# Patient Record
Sex: Female | Born: 1955 | Race: White | Hispanic: No | Marital: Married | Smoking: Former smoker
Health system: Southern US, Community
[De-identification: ages and names within clinical notes are randomized; demographics above are authoritative.]

## PROBLEM LIST (undated history)

## (undated) DIAGNOSIS — I10 Essential (primary) hypertension: Secondary | ICD-10-CM

## (undated) DIAGNOSIS — I8393 Asymptomatic varicose veins of bilateral lower extremities: Secondary | ICD-10-CM

## (undated) DIAGNOSIS — R911 Solitary pulmonary nodule: Secondary | ICD-10-CM

## (undated) DIAGNOSIS — S92009A Unspecified fracture of unspecified calcaneus, initial encounter for closed fracture: Secondary | ICD-10-CM

## (undated) DIAGNOSIS — D649 Anemia, unspecified: Secondary | ICD-10-CM

## (undated) DIAGNOSIS — N6019 Diffuse cystic mastopathy of unspecified breast: Secondary | ICD-10-CM

## (undated) DIAGNOSIS — Z860101 Personal history of adenomatous and serrated colon polyps: Secondary | ICD-10-CM

## (undated) DIAGNOSIS — Z9989 Dependence on other enabling machines and devices: Secondary | ICD-10-CM

## (undated) DIAGNOSIS — G4730 Sleep apnea, unspecified: Secondary | ICD-10-CM

## (undated) DIAGNOSIS — M8580 Other specified disorders of bone density and structure, unspecified site: Secondary | ICD-10-CM

## (undated) DIAGNOSIS — Z86010 Personal history of colonic polyps: Secondary | ICD-10-CM

## (undated) DIAGNOSIS — T7840XA Allergy, unspecified, initial encounter: Secondary | ICD-10-CM

## (undated) DIAGNOSIS — G4733 Obstructive sleep apnea (adult) (pediatric): Secondary | ICD-10-CM

## (undated) DIAGNOSIS — E669 Obesity, unspecified: Secondary | ICD-10-CM

## (undated) DIAGNOSIS — N281 Cyst of kidney, acquired: Secondary | ICD-10-CM

## (undated) DIAGNOSIS — F419 Anxiety disorder, unspecified: Secondary | ICD-10-CM

## (undated) DIAGNOSIS — C679 Malignant neoplasm of bladder, unspecified: Secondary | ICD-10-CM

## (undated) HISTORY — DX: Other specified disorders of bone density and structure, unspecified site: M85.80

## (undated) HISTORY — DX: Unspecified fracture of unspecified calcaneus, initial encounter for closed fracture: S92.009A

## (undated) HISTORY — DX: Sleep apnea, unspecified: G47.30

## (undated) HISTORY — DX: Diffuse cystic mastopathy of unspecified breast: N60.19

## (undated) HISTORY — DX: Obstructive sleep apnea (adult) (pediatric): G47.33

## (undated) HISTORY — DX: Essential (primary) hypertension: I10

## (undated) HISTORY — PX: NO PAST SURGERIES: SHX2092

## (undated) HISTORY — DX: Allergy, unspecified, initial encounter: T78.40XA

## (undated) HISTORY — DX: Obesity, unspecified: E66.9

## (undated) HISTORY — DX: Personal history of colonic polyps: Z86.010

## (undated) HISTORY — PX: OTHER SURGICAL HISTORY: SHX169

## (undated) HISTORY — PX: COLONOSCOPY W/ POLYPECTOMY: SHX1380

## (undated) HISTORY — PX: BREAST CYST ASPIRATION: SHX578

---

## 2001-05-23 ENCOUNTER — Encounter: Payer: Self-pay | Admitting: Obstetrics and Gynecology

## 2001-05-23 ENCOUNTER — Encounter: Admission: RE | Admit: 2001-05-23 | Discharge: 2001-05-23 | Payer: Self-pay | Admitting: Obstetrics and Gynecology

## 2001-05-27 ENCOUNTER — Encounter: Payer: Self-pay | Admitting: Obstetrics and Gynecology

## 2001-05-27 ENCOUNTER — Encounter: Admission: RE | Admit: 2001-05-27 | Discharge: 2001-05-27 | Payer: Self-pay | Admitting: Obstetrics and Gynecology

## 2001-09-11 ENCOUNTER — Other Ambulatory Visit: Admission: RE | Admit: 2001-09-11 | Discharge: 2001-09-11 | Payer: Self-pay | Admitting: Obstetrics and Gynecology

## 2002-07-07 ENCOUNTER — Encounter: Admission: RE | Admit: 2002-07-07 | Discharge: 2002-07-07 | Payer: Self-pay | Admitting: Obstetrics and Gynecology

## 2002-07-07 ENCOUNTER — Encounter: Payer: Self-pay | Admitting: Obstetrics and Gynecology

## 2003-09-09 ENCOUNTER — Encounter: Admission: RE | Admit: 2003-09-09 | Discharge: 2003-09-09 | Payer: Self-pay | Admitting: Obstetrics and Gynecology

## 2004-09-29 ENCOUNTER — Encounter: Admission: RE | Admit: 2004-09-29 | Discharge: 2004-09-29 | Payer: Self-pay | Admitting: Surgery

## 2004-09-29 ENCOUNTER — Ambulatory Visit: Payer: Self-pay | Admitting: Internal Medicine

## 2004-11-07 ENCOUNTER — Ambulatory Visit: Payer: Self-pay | Admitting: Internal Medicine

## 2005-05-09 ENCOUNTER — Ambulatory Visit: Payer: Self-pay | Admitting: Internal Medicine

## 2005-11-26 ENCOUNTER — Encounter: Admission: RE | Admit: 2005-11-26 | Discharge: 2005-11-26 | Payer: Self-pay | Admitting: Surgery

## 2006-06-25 ENCOUNTER — Ambulatory Visit: Payer: Self-pay | Admitting: Internal Medicine

## 2006-07-05 ENCOUNTER — Ambulatory Visit: Payer: Self-pay | Admitting: Internal Medicine

## 2006-07-22 ENCOUNTER — Ambulatory Visit: Payer: Self-pay | Admitting: Gastroenterology

## 2006-07-31 ENCOUNTER — Ambulatory Visit: Payer: Self-pay | Admitting: Gastroenterology

## 2006-07-31 ENCOUNTER — Encounter (INDEPENDENT_AMBULATORY_CARE_PROVIDER_SITE_OTHER): Payer: Self-pay | Admitting: *Deleted

## 2006-12-06 ENCOUNTER — Encounter: Admission: RE | Admit: 2006-12-06 | Discharge: 2006-12-06 | Payer: Self-pay | Admitting: Surgery

## 2007-02-24 ENCOUNTER — Encounter: Payer: Self-pay | Admitting: Internal Medicine

## 2007-02-24 ENCOUNTER — Ambulatory Visit: Payer: Self-pay | Admitting: Internal Medicine

## 2007-08-06 ENCOUNTER — Telehealth (INDEPENDENT_AMBULATORY_CARE_PROVIDER_SITE_OTHER): Payer: Self-pay | Admitting: *Deleted

## 2007-11-14 ENCOUNTER — Ambulatory Visit: Payer: Self-pay | Admitting: Internal Medicine

## 2007-11-14 DIAGNOSIS — I10 Essential (primary) hypertension: Secondary | ICD-10-CM | POA: Insufficient documentation

## 2007-11-14 DIAGNOSIS — D649 Anemia, unspecified: Secondary | ICD-10-CM | POA: Insufficient documentation

## 2007-11-24 ENCOUNTER — Ambulatory Visit: Payer: Self-pay | Admitting: Internal Medicine

## 2007-11-25 ENCOUNTER — Encounter (INDEPENDENT_AMBULATORY_CARE_PROVIDER_SITE_OTHER): Payer: Self-pay | Admitting: *Deleted

## 2007-11-25 ENCOUNTER — Encounter: Payer: Self-pay | Admitting: Internal Medicine

## 2007-11-25 LAB — CONVERTED CEMR LAB
Alkaline Phosphatase: 63 units/L (ref 39–117)
BUN: 21 mg/dL (ref 6–23)
CO2: 30 meq/L (ref 19–32)
Calcium: 9.7 mg/dL (ref 8.4–10.5)
Chloride: 103 meq/L (ref 96–112)
Creatinine, Ser: 1 mg/dL (ref 0.4–1.2)
Eosinophils Absolute: 0.2 10*3/uL (ref 0.0–0.6)
Glucose, Bld: 108 mg/dL — ABNORMAL HIGH (ref 70–99)
HCT: 33.8 % — ABNORMAL LOW (ref 36.0–46.0)
Lymphocytes Relative: 28.4 % (ref 12.0–46.0)
MCHC: 34.1 g/dL (ref 30.0–36.0)
Monocytes Relative: 7.6 % (ref 3.0–11.0)
Neutro Abs: 3.3 10*3/uL (ref 1.4–7.7)
Neutrophils Relative %: 59.3 % (ref 43.0–77.0)
Potassium: 4.5 meq/L (ref 3.5–5.1)
RBC: 3.66 M/uL — ABNORMAL LOW (ref 3.87–5.11)
RDW: 11.7 % (ref 11.5–14.6)
Total Bilirubin: 0.5 mg/dL (ref 0.3–1.2)
Vitamin B-12: 301 pg/mL (ref 211–911)
WBC: 5.6 10*3/uL (ref 4.5–10.5)

## 2007-11-29 LAB — CONVERTED CEMR LAB: Vit D, 1,25-Dihydroxy: 73 (ref 30–89)

## 2007-12-01 ENCOUNTER — Encounter (INDEPENDENT_AMBULATORY_CARE_PROVIDER_SITE_OTHER): Payer: Self-pay | Admitting: *Deleted

## 2007-12-23 ENCOUNTER — Encounter: Admission: RE | Admit: 2007-12-23 | Discharge: 2007-12-23 | Payer: Self-pay | Admitting: Obstetrics and Gynecology

## 2008-01-02 ENCOUNTER — Ambulatory Visit: Payer: Self-pay | Admitting: Family Medicine

## 2008-01-05 ENCOUNTER — Encounter (INDEPENDENT_AMBULATORY_CARE_PROVIDER_SITE_OTHER): Payer: Self-pay | Admitting: *Deleted

## 2008-01-27 ENCOUNTER — Ambulatory Visit: Payer: Self-pay | Admitting: Internal Medicine

## 2008-02-08 LAB — CONVERTED CEMR LAB
Basophils Relative: 0.7 % (ref 0.0–1.0)
Eosinophils Absolute: 0.2 10*3/uL (ref 0.0–0.7)
Neutro Abs: 3.1 10*3/uL (ref 1.4–7.7)
Neutrophils Relative %: 57.8 % (ref 43.0–77.0)
Platelets: 290 10*3/uL (ref 150–400)
RDW: 11.4 % — ABNORMAL LOW (ref 11.5–14.6)
WBC: 5.3 10*3/uL (ref 4.5–10.5)

## 2008-02-09 ENCOUNTER — Encounter (INDEPENDENT_AMBULATORY_CARE_PROVIDER_SITE_OTHER): Payer: Self-pay | Admitting: *Deleted

## 2008-08-02 ENCOUNTER — Ambulatory Visit: Payer: Self-pay | Admitting: Internal Medicine

## 2008-08-10 ENCOUNTER — Encounter (INDEPENDENT_AMBULATORY_CARE_PROVIDER_SITE_OTHER): Payer: Self-pay | Admitting: *Deleted

## 2008-08-10 LAB — CONVERTED CEMR LAB
Basophils Relative: 0.5 % (ref 0.0–3.0)
Eosinophils Relative: 2.8 % (ref 0.0–5.0)
Folate: 12.6 ng/mL
HCT: 34.4 % — ABNORMAL LOW (ref 36.0–46.0)
MCHC: 34.2 g/dL (ref 30.0–36.0)
Monocytes Absolute: 0.4 10*3/uL (ref 0.1–1.0)
Monocytes Relative: 7.4 % (ref 3.0–12.0)
Neutro Abs: 3.4 10*3/uL (ref 1.4–7.7)
Platelets: 257 10*3/uL (ref 150–400)
RBC: 3.65 M/uL — ABNORMAL LOW (ref 3.87–5.11)
RDW: 11.5 % (ref 11.5–14.6)
Saturation Ratios: 19.2 % — ABNORMAL LOW (ref 20.0–50.0)
WBC: 5.8 10*3/uL (ref 4.5–10.5)

## 2008-08-19 ENCOUNTER — Ambulatory Visit: Payer: Self-pay | Admitting: Internal Medicine

## 2008-10-29 DIAGNOSIS — S92009A Unspecified fracture of unspecified calcaneus, initial encounter for closed fracture: Secondary | ICD-10-CM

## 2008-10-29 HISTORY — DX: Unspecified fracture of unspecified calcaneus, initial encounter for closed fracture: S92.009A

## 2009-03-29 ENCOUNTER — Ambulatory Visit: Payer: Self-pay | Admitting: Internal Medicine

## 2009-03-29 ENCOUNTER — Encounter (INDEPENDENT_AMBULATORY_CARE_PROVIDER_SITE_OTHER): Payer: Self-pay | Admitting: *Deleted

## 2009-03-29 DIAGNOSIS — M8580 Other specified disorders of bone density and structure, unspecified site: Secondary | ICD-10-CM | POA: Insufficient documentation

## 2009-03-30 ENCOUNTER — Ambulatory Visit: Payer: Self-pay | Admitting: Internal Medicine

## 2009-03-31 ENCOUNTER — Telehealth (INDEPENDENT_AMBULATORY_CARE_PROVIDER_SITE_OTHER): Payer: Self-pay | Admitting: *Deleted

## 2009-03-31 ENCOUNTER — Encounter (INDEPENDENT_AMBULATORY_CARE_PROVIDER_SITE_OTHER): Payer: Self-pay | Admitting: *Deleted

## 2009-04-04 ENCOUNTER — Telehealth: Payer: Self-pay | Admitting: Internal Medicine

## 2009-04-07 ENCOUNTER — Encounter: Payer: Self-pay | Admitting: Internal Medicine

## 2009-04-07 ENCOUNTER — Ambulatory Visit: Payer: Self-pay | Admitting: Family Medicine

## 2009-04-15 ENCOUNTER — Telehealth: Payer: Self-pay | Admitting: Internal Medicine

## 2009-04-18 ENCOUNTER — Encounter: Admission: RE | Admit: 2009-04-18 | Discharge: 2009-04-18 | Payer: Self-pay | Admitting: Sports Medicine

## 2009-05-05 ENCOUNTER — Encounter (INDEPENDENT_AMBULATORY_CARE_PROVIDER_SITE_OTHER): Payer: Self-pay | Admitting: *Deleted

## 2009-08-17 ENCOUNTER — Encounter: Admission: RE | Admit: 2009-08-17 | Discharge: 2009-08-17 | Payer: Self-pay | Admitting: Obstetrics and Gynecology

## 2009-09-02 ENCOUNTER — Ambulatory Visit: Payer: Self-pay | Admitting: Internal Medicine

## 2009-12-13 ENCOUNTER — Ambulatory Visit: Payer: Self-pay | Admitting: Internal Medicine

## 2009-12-13 DIAGNOSIS — R7309 Other abnormal glucose: Secondary | ICD-10-CM | POA: Insufficient documentation

## 2009-12-13 DIAGNOSIS — Z860100 Personal history of colon polyps, unspecified: Secondary | ICD-10-CM | POA: Insufficient documentation

## 2009-12-21 ENCOUNTER — Ambulatory Visit: Payer: Self-pay | Admitting: Internal Medicine

## 2010-01-02 LAB — CONVERTED CEMR LAB
Alkaline Phosphatase: 70 units/L (ref 39–117)
Basophils Relative: 1.6 % (ref 0.0–3.0)
Bilirubin, Direct: 0 mg/dL (ref 0.0–0.3)
Calcium: 9.7 mg/dL (ref 8.4–10.5)
Chloride: 108 meq/L (ref 96–112)
Eosinophils Absolute: 0.2 10*3/uL (ref 0.0–0.7)
Eosinophils Relative: 3.4 % (ref 0.0–5.0)
HCT: 32.6 % — ABNORMAL LOW (ref 36.0–46.0)
Lymphocytes Relative: 32.5 % (ref 12.0–46.0)
MCHC: 34.3 g/dL (ref 30.0–36.0)
MCV: 92.7 fL (ref 78.0–100.0)
Monocytes Absolute: 0.4 10*3/uL (ref 0.1–1.0)
Monocytes Relative: 7.9 % (ref 3.0–12.0)
Neutro Abs: 2.3 10*3/uL (ref 1.4–7.7)
Neutrophils Relative %: 54.6 % (ref 43.0–77.0)
Platelets: 236 10*3/uL (ref 150.0–400.0)
Potassium: 4.7 meq/L (ref 3.5–5.1)
Saturation Ratios: 17.1 % — ABNORMAL LOW (ref 20.0–50.0)
TSH: 1.13 microintl units/mL (ref 0.35–5.50)
Total Bilirubin: 0.4 mg/dL (ref 0.3–1.2)
Transferrin: 234 mg/dL (ref 212.0–360.0)
VLDL: 16 mg/dL (ref 0.0–40.0)
WBC: 4.5 10*3/uL (ref 4.5–10.5)

## 2010-09-14 ENCOUNTER — Ambulatory Visit: Payer: Self-pay | Admitting: Internal Medicine

## 2010-11-28 NOTE — Assessment & Plan Note (Signed)
Summary: SINUS FOR 3 WEEKS, COUGH///SPH   Vital Signs:  Patient profile:   55 year old female Weight:      174.2 pounds BMI:     30.72 Temp:     99.2 degrees F oral Pulse rate:   72 / minute Resp:     15 per minute BP sitting:   116 / 70  (left arm) Cuff size:   large  Vitals Entered By: Shonna Chock CMA (September 14, 2010 4:11 PM) CC: Sinus concerns and cough x 3 weeks, URI symptoms   CC:  Sinus concerns and cough x 3 weeks and URI symptoms.  History of Present Illness: RTI Symptoms      This is a 55 year old woman who presents with RTI  symptoms; onset 3 weeks ago sinus pressure . Mucinex helped until 10 days ago when she developed throat congestion.  Now the patient reports nasal congestion and purulent nasal discharge, but denies sore throat and earache.  The patient denies fever, dyspnea, and wheezing.  The patient denies headache.  Risk factors for Strep sinusitis include bilateral facial pain and tooth pain.  The patient denies the following risk factors for Strep sinusitis: tender adenopathy.  Rx: Neti pot   Current Medications (verified): 1)  Spironolactone 25 Mg  Tabs (Spironolactone) .Marland Kitchen.. 1 By Mouth Once Daily- 2)  Diovan 320 Mg  Tabs (Valsartan) .... 1/2 Tab Qd 3)  Calcium .... 2 Tabs Qd  Allergies: 1)  ! Lotensin 2)  ! Hydrochlorothiazide  Physical Exam  General:  well-nourished,in no acute distress; alert,appropriate and cooperative throughout examination Ears:  External ear exam shows no significant lesions or deformities.  Otoscopic examination reveals clear canals, tympanic membranes are intact bilaterally without bulging, retraction, inflammation or discharge. Hearing is grossly normal bilaterally. Nose:  External nasal examination shows no deformity or inflammation. Nasal mucosa are pink and moist without lesions or exudates. Mouth:  Oral mucosa and oropharynx without lesions or exudates.  Teeth in good repair. Lungs:  Normal respiratory effort, chest expands  symmetrically. Lungs are clear to auscultation, no crackles or wheezes. Cervical Nodes:  No lymphadenopathy noted Axillary Nodes:  No palpable lymphadenopathy   Impression & Recommendations:  Problem # 1:  SINUSITIS- ACUTE-NOS (ICD-461.9)  Her updated medication list for this problem includes:    Amoxicillin 500 Mg Caps (Amoxicillin) .Marland Kitchen... 1 three times a day  Complete Medication List: 1)  Spironolactone 25 Mg Tabs (Spironolactone) .Marland Kitchen.. 1 by mouth once daily- 2)  Diovan 320 Mg Tabs (Valsartan) .... 1/2 tab qd 3)  Calcium  .... 2 tabs qd 4)  Amoxicillin 500 Mg Caps (Amoxicillin) .Marland Kitchen.. 1 three times a day  Patient Instructions: 1)  Neti pot  once daily until  sinuses. 2)  Drink as much NON dairy  fluid as you can tolerate for the next few days. Prescriptions: AMOXICILLIN 500 MG CAPS (AMOXICILLIN) 1 three times a day  #30 x 0   Entered and Authorized by:   Marga Melnick MD   Signed by:   Marga Melnick MD on 09/14/2010   Method used:   [facility]axed to ...       [facility] [phone]* (retail)       [address]       [place], [place]  [address]       Ph: [phone]       Fax: [phone]   RxID:   [id number]    Orders Added:  1)  Est. Patient Level III [29562]

## 2010-11-28 NOTE — Assessment & Plan Note (Signed)
Summary: CPX/KDC   Vital Signs:  Patient profile:   55 year old female Height:      63.26 inches Weight:      173.2 pounds BMI:     30.54 Temp:     99.0 degrees F oral Pulse rate:   60 / minute Resp:     14 per minute BP sitting:   114 / 70  (left arm) Cuff size:   large  Vitals Entered By: Shonna Chock (December 13, 2009 1:01 PM)  Comments REVIEWED MED LIST, PATIENT AGREED DOSE AND INSTRUCTION CORRECT    History of Present Illness: Angel Williamson is here for a physical; she is asymptomatic.  Preventive Screening-Counseling & Management  Caffeine-Diet-Exercise     Does Patient Exercise: yes  Allergies: 1)  ! Lotensin 2)  ! Hydrochlorothiazide  Past History:  Past Medical History: Osteopenia: T score - 1.6 @ L femoral neck Hypertension low HDL fibrocystic breast disease Anemia, PMH of  Colonic polyps, hx of Fractured heel post fall; Hyperglycemia, PMH of  Past Surgical History: gravida  2 para 2 colonoscopy : adenomatous polyps 2003 & 2007, due 2012, Dr Arlyce Dice  Family History: paternal grandmother ? cancer maternal grandfather diabetes paternal grandfather cva @  age 74 father colon polyps mother anemia cousin anemia  Social History: Former Smoker quit 1988 Alcohol use-no No diet but decreased sugar Married Regular exercise-yes: Ellipitical or walking 3X/week  Review of Systems  The patient denies anorexia, fever, weight loss, weight gain, vision loss, decreased hearing, hoarseness, chest pain, syncope, dyspnea on exertion, peripheral edema, prolonged cough, headaches, hemoptysis, abdominal pain, melena, hematochezia, severe indigestion/heartburn, hematuria, incontinence, suspicious skin lesions, depression, unusual weight change, abnormal bleeding, enlarged lymph nodes, and angioedema.         Microscopic hematuria as per Dr Ambrose Mantle. GI:  Stool card done @ Gyn annually.. MS:  Complains of joint pain; denies joint redness and joint swelling; Hip pain with  prolonged sitting. Osteopenis 2010; vitamin D level was 73 in 2009. On Ca++ with vit D bid.  Physical Exam  General:  well-nourished, alert,appropriate and cooperative throughout examination Head:  Normocephalic and atraumatic without obvious abnormalities.  Eyes:  No corneal or conjunctival inflammation noted. Perrla. Funduscopic exam benign, without hemorrhages, exudates or papilledema.  Ears:  External ear exam shows no significant lesions or deformities.  Otoscopic examination reveals clear canals, tympanic membranes are intact bilaterally without bulging, retraction, inflammation or discharge. Hearing is grossly normal bilaterally. Nose:  External nasal examination shows no deformity or inflammation. Nasal mucosa are pink and moist without lesions or exudates. Mouth:  Oral mucosa and oropharynx without lesions or exudates.  Teeth in good repair. Neck:  No deformities, masses, or tenderness noted. Breasts:  Dr Ambrose Mantle Lungs:  Normal respiratory effort, chest expands symmetrically. Lungs are clear to auscultation, no crackles or wheezes. Heart:  Normal rate and regular rhythm. S1 and S2 normal without gallop, murmur, click, rub. S4  Abdomen:  Bowel sounds positive,abdomen soft and non-tender without masses, organomegaly or hernias noted. Genitalia:  Dr Ambrose Mantle Msk:  No deformity or scoliosis noted of thoracic or lumbar spine.   Pulses:  R and L carotid,radial,dorsalis pedis and posterior tibial pulses are full and equal bilaterally Extremities:  No clubbing, cyanosis, edema, or deformity noted with normal full range of motion of all joints.   Mi nor crepitus of knees Neurologic:  alert & oriented X3 and DTRs symmetrical and normal.   Skin:  Intact without suspicious lesions or rashes Cervical Nodes:  No lymphadenopathy noted Axillary Nodes:  No palpable lymphadenopathy Psych:  memory intact for recent and remote, normally interactive, and good eye contact.     Impression &  Recommendations:  Problem # 1:  PREVENTIVE HEALTH CARE (ICD-V70.0)  Orders: EKG w/ Interpretation (93000)  Problem # 2:  COLONIC POLYPS, HX OF (ICD-V12.72) as per Dr Arlyce Dice  Problem # 3:  OSTEOPENIA (ICD-733.90) T score -1.6 L femoral neck; BMD due 04/2011  Problem # 4:  UNSPECIFIED ANEMIA (ICD-285.9) PMH of  Problem # 5:  HYPERTENSION, ESSENTIAL NOS (ICD-401.9)  controlled Her updated medication list for this problem includes:    Spironolactone 25 Mg Tabs (Spironolactone) .Marland Kitchen... 1 by mouth once daily-    Diovan 320 Mg Tabs (Valsartan) .Marland Kitchen... 1/2 tab qd  Orders: EKG w/ Interpretation (93000)  Problem # 6:  HYPERGLYCEMIA, FASTING (ICD-790.29) FBS 108 in 2009  Complete Medication List: 1)  Spironolactone 25 Mg Tabs (Spironolactone) .Marland Kitchen.. 1 by mouth once daily- 2)  Diovan 320 Mg Tabs (Valsartan) .... 1/2 tab qd 3)  Calcium  .... 2 tabs qd  Patient Instructions: 1)  Schedule fasting labs:Vitamin D level, iron panel, B12/folate,  2)  BMP , 3)  Hepatic Panel , 4)  Lipid Panel , 5)  TSH , 6)  CBC w/ Diff , 7)  HbgA1C .  Prescriptions: DIOVAN 320 MG  TABS (VALSARTAN) 1/2 tab qd  #45 x 3   Entered and Authorized by:   Marga Melnick MD   Signed by:   Marga Melnick MD on 12/13/2009   Method used:   Faxed to ...       [facility] [phone]* (retail)       [address]       [place], [place]  [address]       Ph: [phone]       Fax: [phone]   RxID:   [id number] SPIRONOLACTONE 25 MG  TABS (SPIRONOLACTONE) 1 by mouth once daily-  #90 x 3   Entered and Authorized by:   Marga Melnick MD   Signed by:   Marga Melnick MD on 12/13/2009   Method used:   Faxed to ...       [facility] [phone]* (retail)       [address]       [place], [place]  [address]       Ph: [phone]       Fax: [phone]   RxID:   [id number]

## 2010-12-05 ENCOUNTER — Encounter: Payer: Self-pay | Admitting: Internal Medicine

## 2010-12-05 ENCOUNTER — Ambulatory Visit (INDEPENDENT_AMBULATORY_CARE_PROVIDER_SITE_OTHER): Payer: 59 | Admitting: Internal Medicine

## 2010-12-05 DIAGNOSIS — J019 Acute sinusitis, unspecified: Secondary | ICD-10-CM

## 2010-12-14 NOTE — Assessment & Plan Note (Signed)
Summary: SINUS CONGESTION/RH....   Vital Signs:  Patient profile:   55 year old female Weight:      174.8 pounds BMI:     30.82 Temp:     99.2 degrees F oral Pulse rate:   72 / minute Resp:     14 per minute BP sitting:   112 / 68  (left arm) Cuff size:   large  Vitals Entered By: Shonna Chock CMA (December 05, 2010 11:56 AM) CC: Sinus congestion x 1 week, URI symptoms   CC:  Sinus congestion x 1 week and URI symptoms.  History of Present Illness:    Onset 11/29/2010 as head congestion , frontal headache & facial pain;she now   reports purulent nasal discharge and sore throat, but denies productive cough and earache.  The patient denies fever, dyspnea, and wheezing.  The patient also reports persistent frontal  headache & bilateral nasal discharge.  The patient denies the following risk factors for Strep sinusitis: tooth pain and tender adenopathy.  Rx: Tylenol , Mucinex, Neti pot   Current Medications (verified): 1)  Spironolactone 25 Mg  Tabs (Spironolactone) .Marland Kitchen.. 1 By Mouth Once Daily- 2)  Diovan 320 Mg  Tabs (Valsartan) .... 1/2 Tab Qd 3)  Calcium .... 2 Tabs Qd 4)  Fish Oil 1200 Mg Caps (Omega-3 Fatty Acids) .Marland Kitchen.. 1 By Mouth Once Daily  Allergies: 1)  ! Lotensin 2)  ! Hydrochlorothiazide  Physical Exam  General:  well-nourished,in no acute distress; alert,appropriate and cooperative throughout examination Ears:  External ear exam shows no significant lesions or deformities.  Otoscopic examination reveals clear canals, tympanic membranes are intact bilaterally without bulging, retraction, inflammation or discharge. Hearing is grossly normal bilaterally. Nose:  External nasal examination shows no deformity or inflammation. Nasal mucosa are  dry without lesions or exudates. Mouth:  Oral mucosa and oropharynx without lesions or exudates.  Teeth in good repair. Lungs:  Normal respiratory effort, chest expands symmetrically. Lungs are clear to auscultation, no crackles or  wheezes. Heart:  Normal rate and regular rhythm. S1 and S2 normal without gallop, murmur, click, rub . Cervical Nodes:  No lymphadenopathy noted Axillary Nodes:  No palpable lymphadenopathy   Impression & Recommendations:  Problem # 1:  ACUTE SINUSITIS, UNSPECIFIED (ICD-461.9)  The following medications were removed from the medication list:    Amoxicillin 500 Mg Caps (Amoxicillin) .Marland Kitchen... 1 three times a day Her updated medication list for this problem includes:    Cefuroxime Axetil 500 Mg Tabs (Cefuroxime axetil) .Marland Kitchen... 1 two times a day    Fluticasone Propionate 50 Mcg/act Susp (Fluticasone propionate) .Marland Kitchen... 1 spray two times a day as needed  Complete Medication List: 1)  Spironolactone 25 Mg Tabs (Spironolactone) .Marland Kitchen.. 1 by mouth once daily- 2)  Diovan 320 Mg Tabs (Valsartan) .... 1/2 tab qd 3)  Calcium  .... 2 tabs qd 4)  Fish Oil 1200 Mg Caps (Omega-3 fatty acids) .Marland Kitchen.. 1 by mouth once daily 5)  Cefuroxime Axetil 500 Mg Tabs (Cefuroxime axetil) .Marland Kitchen.. 1 two times a day 6)  Fluticasone Propionate 50 Mcg/act Susp (Fluticasone propionate) .Marland Kitchen.. 1 spray two times a day as needed  Patient Instructions: 1)  Neti pot once daily - two times a day as needed for congestion. 2)  Drink as much NON dairy  fluid as you can tolerate for the next few days. Prescriptions: FLUTICASONE PROPIONATE 50 MCG/ACT SUSP (FLUTICASONE PROPIONATE) 1 spray two times a day as needed  #1 x 11   Entered and  Authorized by:   Marga Melnick MD   Signed by:   Marga Melnick MD on 12/05/2010   Method used:   Electronically to        [facility] [phone]* (retail)       [address]       [place], [place]  [address]       Ph: [phone]       Fax: [phone]   RxID:   [id number] CEFUROXIME AXETIL 500 MG TABS (CEFUROXIME AXETIL) 1 two times a day  #20 x 0   Entered and Authorized by:   Marga Melnick MD   Signed by:   Marga Melnick MD on 12/05/2010   Method used:    Electronically to        [facility] [phone]* (retail)       [address]       [place], [place]  [address]       Ph: [phone]       Fax: [phone]   RxID:   [id number]    Orders Added: 1)  Est. Patient Level III [30160]

## 2011-02-08 ENCOUNTER — Other Ambulatory Visit: Payer: Self-pay | Admitting: Internal Medicine

## 2011-05-18 ENCOUNTER — Other Ambulatory Visit: Payer: Self-pay | Admitting: Internal Medicine

## 2011-07-24 ENCOUNTER — Other Ambulatory Visit: Payer: Self-pay | Admitting: Internal Medicine

## 2011-08-01 ENCOUNTER — Other Ambulatory Visit: Payer: Self-pay | Admitting: Obstetrics and Gynecology

## 2011-08-01 DIAGNOSIS — Z1231 Encounter for screening mammogram for malignant neoplasm of breast: Secondary | ICD-10-CM

## 2011-08-09 ENCOUNTER — Ambulatory Visit
Admission: RE | Admit: 2011-08-09 | Discharge: 2011-08-09 | Disposition: A | Discharge status: Home / Self Care | Payer: 59 | Source: Ambulatory Visit | Attending: Obstetrics and Gynecology | Admitting: Obstetrics and Gynecology

## 2011-08-09 DIAGNOSIS — Z1231 Encounter for screening mammogram for malignant neoplasm of breast: Secondary | ICD-10-CM

## 2011-08-27 ENCOUNTER — Encounter: Payer: Self-pay | Admitting: Gastroenterology

## 2011-09-27 ENCOUNTER — Ambulatory Visit (INDEPENDENT_AMBULATORY_CARE_PROVIDER_SITE_OTHER): Payer: 59 | Admitting: Internal Medicine

## 2011-09-27 ENCOUNTER — Encounter: Payer: Self-pay | Admitting: Internal Medicine

## 2011-09-27 VITALS — BP 94/60 | HR 88 | Temp 100.1°F | Resp 18 | Wt 177.0 lb

## 2011-09-27 DIAGNOSIS — J329 Chronic sinusitis, unspecified: Secondary | ICD-10-CM

## 2011-09-27 MED ORDER — AMOXICILLIN-POT CLAVULANATE 875-125 MG PO TABS: 1.0000 | ORAL_TABLET | Freq: Two times a day (BID) | ORAL | Status: AC

## 2011-09-30 ENCOUNTER — Encounter: Payer: Self-pay | Admitting: Internal Medicine

## 2011-09-30 DIAGNOSIS — J329 Chronic sinusitis, unspecified: Secondary | ICD-10-CM | POA: Insufficient documentation

## 2011-09-30 NOTE — Assessment & Plan Note (Signed)
Take abx to completion. Followup if no improvement or worsening.

## 2011-09-30 NOTE — Progress Notes (Signed)
  Subjective:    Patient ID: KEMISHA BONNETTE, female    DOB: May 25, 1956, 55 y.o.   MRN: [id number]  HPI Pt presents to clinic for evaluation of cough. Notes 2wk h/o head congestion, ha, cough productive for green sputum. +bilateral maxillary pressure. No f/c. No other complaints.  No past medical history on file. No past surgical history on file.  reports that she has quit smoking. She has never used smokeless tobacco. She reports that she does not drink alcohol or use illicit drugs. family history is not on file. Allergies  Allergen Reactions  . Benazepril Hcl     REACTION: cough  . Hydrochlorothiazide     REACTION: low potassium      Review of Systems see hpi     Objective:   Physical Exam  Nursing note and vitals reviewed. Constitutional: She appears well-developed and well-nourished. No distress.  HENT:  Head: Normocephalic and atraumatic.  Right Ear: External ear normal.  Left Ear: External ear normal.  Nose: Right sinus exhibits maxillary sinus tenderness. Left sinus exhibits maxillary sinus tenderness.  Mouth/Throat: Oropharynx is clear and moist.  Eyes: Conjunctivae are normal. Right eye exhibits no discharge. Left eye exhibits no discharge. No scleral icterus.  Neck: Neck supple.  Neurological: She is alert.  Skin: Skin is warm and dry. She is not diaphoretic.  Psychiatric: She has a normal mood and affect.          Assessment & Plan:

## 2011-10-30 HISTORY — PX: COLONOSCOPY: SHX5424

## 2011-11-01 ENCOUNTER — Ambulatory Visit: Payer: 59 | Admitting: Family Medicine

## 2011-12-22 ENCOUNTER — Other Ambulatory Visit: Payer: Self-pay | Admitting: Internal Medicine

## 2012-01-22 ENCOUNTER — Other Ambulatory Visit: Payer: Self-pay | Admitting: Internal Medicine

## 2012-04-18 ENCOUNTER — Encounter: Payer: Self-pay | Admitting: Internal Medicine

## 2012-04-18 ENCOUNTER — Ambulatory Visit (INDEPENDENT_AMBULATORY_CARE_PROVIDER_SITE_OTHER): Payer: 59 | Admitting: Internal Medicine

## 2012-04-18 VITALS — BP 124/70 | HR 70 | Temp 98.8°F | Resp 12 | Ht 61.03 in | Wt 173.0 lb

## 2012-04-18 DIAGNOSIS — M949 Disorder of cartilage, unspecified: Secondary | ICD-10-CM

## 2012-04-18 DIAGNOSIS — Z0000 Encounter for general adult medical examination without abnormal findings: Secondary | ICD-10-CM

## 2012-04-18 DIAGNOSIS — D649 Anemia, unspecified: Secondary | ICD-10-CM | POA: Insufficient documentation

## 2012-04-18 DIAGNOSIS — M899 Disorder of bone, unspecified: Secondary | ICD-10-CM

## 2012-04-18 DIAGNOSIS — M8580 Other specified disorders of bone density and structure, unspecified site: Secondary | ICD-10-CM

## 2012-04-18 DIAGNOSIS — C4491 Basal cell carcinoma of skin, unspecified: Secondary | ICD-10-CM | POA: Insufficient documentation

## 2012-04-18 LAB — BASIC METABOLIC PANEL
BUN: 19 mg/dL (ref 6–23)
Creatinine, Ser: 0.9 mg/dL (ref 0.4–1.2)
Sodium: 138 mEq/L (ref 135–145)

## 2012-04-18 LAB — LIPID PANEL
Cholesterol: 134 mg/dL (ref 0–200)
LDL Cholesterol: 71 mg/dL (ref 0–99)
Triglycerides: 103 mg/dL (ref 0.0–149.0)

## 2012-04-18 LAB — CBC WITH DIFFERENTIAL/PLATELET
Basophils Absolute: 0 10*3/uL (ref 0.0–0.1)
Basophils Relative: 0.8 % (ref 0.0–3.0)
Eosinophils Absolute: 0.2 10*3/uL (ref 0.0–0.7)
Eosinophils Relative: 3 % (ref 0.0–5.0)
MCV: 92.5 fl (ref 78.0–100.0)
Monocytes Absolute: 0.4 10*3/uL (ref 0.1–1.0)
Monocytes Relative: 7.7 % (ref 3.0–12.0)
Platelets: 267 10*3/uL (ref 150.0–400.0)
RBC: 3.59 Mil/uL — ABNORMAL LOW (ref 3.87–5.11)
RDW: 12.5 % (ref 11.5–14.6)

## 2012-04-18 LAB — HEMOGLOBIN A1C: Hgb A1c MFr Bld: 6 % (ref 4.6–6.5)

## 2012-04-18 LAB — VITAMIN B12: Vitamin B-12: 279 pg/mL (ref 211–911)

## 2012-04-18 MED ORDER — VALSARTAN 320 MG PO TABS: 160.0000 mg | ORAL_TABLET | Freq: Every day | ORAL | Status: DC

## 2012-04-18 NOTE — Progress Notes (Signed)
  Subjective:    Patient ID: Angel Williamson, female    DOB: 11-22-1955, 56 y.o.   MRN: [id number]  HPI  Angel Williamson is here for a physical; she denies acute issues .      Review of Systems HYPERTENSION: Disease Monitoring: Blood pressure range-not monitored  Chest pain, palpitations-no      Dyspnea- no Medications: Compliance- yes  Lightheadedness,Syncope- no    Edema- no  FASTING HYPERGLYCEMIA, PMH of:  Disease Monitoring: Blood Sugar ranges-not monitored  Polyuria/phagia/dipsia- no       Visual problems-some blurring; Ophth exam due    ADENOMATOUS COLON POLYPS: Abd pain, bowel changes-no   She is to schedule followup colonoscopy with Dr. Arlyce Dice      Objective:   Physical Exam Gen.: Healthy and well-nourished in appearance. Alert, appropriate and cooperative throughout exam. Head: Normocephalic without obvious abnormalities  Eyes: No corneal or conjunctival inflammation noted. Pupils equal round reactive to light and accommodation. Fundal exam is benign without hemorrhages, exudate, papilledema. Extraocular motion intact. Vision grossly normal. Ears: External  ear exam reveals no significant lesions or deformities. Canals clear .TMs normal. Hearing is grossly normal bilaterally. Nose: External nasal exam reveals no deformity or inflammation. Nasal mucosa are pink and moist. No lesions or exudates noted.  Mouth: Oral mucosa and oropharynx reveal no lesions or exudates. Teeth in good repair. Neck: No deformities, masses, or tenderness noted. Range of motion &Thyroid normal. Lungs: Normal respiratory effort; chest expands symmetrically. Lungs are clear to auscultation without rales, wheezes, or increased work of breathing. Heart: Normal rate and rhythm. Normal S1 and S2. No gallop, click, or rub. S 4 w/o  murmur. Abdomen: Bowel sounds normal; abdomen soft and nontender. No masses, organomegaly or hernias noted. Genitalia: Dr Ambrose Mantle, Gyn                                                                          Musculoskeletal/extremities: MINOR lordosis noted of  the thoracic  spine. No clubbing, cyanosis, edema, or deformity noted. Range of motion  normal .Tone & strength  normal.Joints normal. Nail health  good. Vascular: Carotid, radial artery, dorsalis pedis and  posterior tibial pulses are full and equal. No bruits present. Neurologic: Alert and oriented x3. Deep tendon reflexes symmetrical and normal.          Skin: Intact without suspicious lesions or rashes. Lymph: No cervical, axillary lymphadenopathy present. Psych: Mood and affect are normal. Normally interactive                                                                                         Assessment & Plan:  #1 comprehensive physical exam; no acute findings #2 see Problem List with Assessments & Recommendations Plan: see Orders

## 2012-04-18 NOTE — Patient Instructions (Addendum)
Preventive Health Care: Exercise  30-45  minutes a day, 3-4 days a week. Walking is especially valuable in preventing Osteoporosis. Eat a low-fat diet with lots of fruits and vegetables, up to 7-9 servings per day.  Consume less than 30 grams of sugar per day from foods & drinks with High Fructose Corn Syrup as #1,2,3 or #4 on label. Eye Doctor - have an eye exam @ least annually  Blood Pressure Goal  Ideally is an AVERAGE < 135/85. This AVERAGE should be calculated from @ least 5-7 BP readings taken @ different times of day on different days of week. You should not respond to isolated BP readings , but rather the AVERAGE for that week.  Please try to go on My Chart within the next 24 hours to allow me to release the results directly to you.

## 2012-04-23 ENCOUNTER — Encounter: Payer: Self-pay | Admitting: *Deleted

## 2012-05-22 ENCOUNTER — Other Ambulatory Visit: Payer: Self-pay | Admitting: Internal Medicine

## 2012-05-22 NOTE — Telephone Encounter (Signed)
Refill done.  

## 2012-05-22 NOTE — Telephone Encounter (Signed)
Not on med list please advise if ok to rx

## 2012-05-22 NOTE — Telephone Encounter (Signed)
OK X 1 year 

## 2012-07-10 ENCOUNTER — Ambulatory Visit (INDEPENDENT_AMBULATORY_CARE_PROVIDER_SITE_OTHER)
Admission: RE | Admit: 2012-07-10 | Discharge: 2012-07-10 | Disposition: A | Discharge status: Home / Self Care | Payer: 59 | Source: Ambulatory Visit | Attending: Internal Medicine | Admitting: Internal Medicine

## 2012-07-10 DIAGNOSIS — M949 Disorder of cartilage, unspecified: Secondary | ICD-10-CM

## 2012-07-10 DIAGNOSIS — M8580 Other specified disorders of bone density and structure, unspecified site: Secondary | ICD-10-CM

## 2012-07-10 DIAGNOSIS — M899 Disorder of bone, unspecified: Secondary | ICD-10-CM

## 2012-07-24 ENCOUNTER — Other Ambulatory Visit: Payer: Self-pay | Admitting: Internal Medicine

## 2012-08-26 ENCOUNTER — Encounter: Payer: Self-pay | Admitting: Gastroenterology

## 2012-09-19 ENCOUNTER — Other Ambulatory Visit: Payer: Self-pay | Admitting: Internal Medicine

## 2012-09-19 NOTE — Telephone Encounter (Signed)
Rx sent.    MW 

## 2012-09-30 ENCOUNTER — Ambulatory Visit (AMBULATORY_SURGERY_CENTER): Payer: BC Managed Care – PPO | Admitting: *Deleted

## 2012-09-30 VITALS — Ht 63.0 in | Wt 179.2 lb

## 2012-09-30 DIAGNOSIS — Z1211 Encounter for screening for malignant neoplasm of colon: Secondary | ICD-10-CM

## 2012-09-30 MED ORDER — NA SULFATE-K SULFATE-MG SULF 17.5-3.13-1.6 GM/177ML PO SOLN: 1.0000 | Freq: Once | ORAL | Status: DC

## 2012-10-01 ENCOUNTER — Encounter: Payer: Self-pay | Admitting: Gastroenterology

## 2012-10-16 ENCOUNTER — Encounter: Payer: BC Managed Care – PPO | Admitting: Gastroenterology

## 2012-10-29 HISTORY — PX: COLONOSCOPY: SHX174

## 2012-11-18 ENCOUNTER — Ambulatory Visit (INDEPENDENT_AMBULATORY_CARE_PROVIDER_SITE_OTHER): Payer: 59 | Admitting: Internal Medicine

## 2012-11-18 VITALS — BP 118/78 | HR 85 | Temp 98.8°F | Wt 179.0 lb

## 2012-11-18 DIAGNOSIS — J40 Bronchitis, not specified as acute or chronic: Secondary | ICD-10-CM

## 2012-11-18 MED ORDER — AMOXICILLIN 500 MG PO CAPS: 1000.0000 mg | ORAL_CAPSULE | Freq: Two times a day (BID) | ORAL | Status: AC

## 2012-11-18 NOTE — Progress Notes (Signed)
  Subjective:    Patient ID: Angel Williamson, female    DOB: 1956-03-24, 57 y.o.   MRN: [id number]  HPI Acute visit Symptoms started 4 days ago: Sore throat, chest congestion, cough with pain at the tracheal area. Sinuses also mildly congested. She's taking Mucinex and Advil as needed.  Past Medical History  Diagnosis Date  . Osteopenia   . Hypertension   . Breast fibrocystic disorder   . History of colonic polyps     adenomatous  . Calcaneus fracture 2010    no surgery   Past Surgical History  Procedure Date  . Colonoscopy w/ polypectomy     adenomatous polyps 2003 & 2007 Due 2012. Dr.Kaplan   . G 2 p 2   . Breast cyst aspiration   . Colonoscopy    History   Social History  . Marital Status: Married    Spouse Name: N/A    Number of Children: N/A  . Years of Education: N/A   Occupational History  . Not on file.   Social History Main Topics  . Smoking status: Former Smoker    Quit date: 10/29/1984  . Smokeless tobacco: Never Used  . Alcohol Use: No  . Drug Use: No  . Sexually Active: Not on file   Other Topics Concern  . Not on file   Social History Narrative  . No narrative on file     Review of Systems No fever chills No nausea, vomiting, diarrhea. No myalgias. Patient is a nonsmoker, denies wheezing.     Objective:   Physical Exam  General -- alert, well-developed HEENT -- TMs normal, throat w/o redness, face symmetric and  slt tender to palpation in the maxillary and frontal sinuses. Nose congested  Lungs -- normal respiratory effort, no intercostal retractions, no accessory muscle use, and normal breath sounds.   Heart-- normal rate, regular rhythm, no murmur, and no gallop.   Psych-- Cognition and judgment appear intact. Alert and cooperative with normal attention span and concentration.  not anxious appearing and not depressed appearing.       Assessment & Plan:   Bronchitis, Symptoms consistent with tracheo- bronchitis, see instructions.

## 2012-11-18 NOTE — Patient Instructions (Addendum)
Rest, fluids , tylenol For cough, take Mucinex DM twice a day as needed  For congestion use flonase and a saline irrigation Take the antibiotic as prescribed  (Amoxicillin) if no better in 3-4 days. Call if no better next week Call anytime if the symptoms are severe

## 2012-11-20 ENCOUNTER — Encounter: Payer: Self-pay | Admitting: Internal Medicine

## 2013-01-21 ENCOUNTER — Other Ambulatory Visit: Payer: Self-pay | Admitting: Internal Medicine

## 2013-03-11 ENCOUNTER — Other Ambulatory Visit: Payer: Self-pay

## 2013-03-11 ENCOUNTER — Encounter: Payer: Self-pay | Admitting: Gastroenterology

## 2013-03-11 DIAGNOSIS — Z1231 Encounter for screening mammogram for malignant neoplasm of breast: Secondary | ICD-10-CM

## 2013-04-15 ENCOUNTER — Other Ambulatory Visit: Payer: Self-pay | Admitting: Internal Medicine

## 2013-04-15 NOTE — Telephone Encounter (Signed)
Patient needs to schedule a CPX  

## 2013-04-17 ENCOUNTER — Ambulatory Visit (AMBULATORY_SURGERY_CENTER): Payer: 59 | Admitting: *Deleted

## 2013-04-17 VITALS — Ht 62.0 in | Wt 175.4 lb

## 2013-04-17 DIAGNOSIS — Z1211 Encounter for screening for malignant neoplasm of colon: Secondary | ICD-10-CM

## 2013-04-17 MED ORDER — NA SULFATE-K SULFATE-MG SULF 17.5-3.13-1.6 GM/177ML PO SOLN: ORAL | Status: DC

## 2013-04-20 ENCOUNTER — Encounter: Payer: Self-pay | Admitting: Gastroenterology

## 2013-05-05 ENCOUNTER — Ambulatory Visit
Admission: RE | Admit: 2013-05-05 | Discharge: 2013-05-05 | Disposition: A | Discharge status: Home / Self Care | Payer: 59 | Source: Ambulatory Visit

## 2013-05-05 DIAGNOSIS — Z1231 Encounter for screening mammogram for malignant neoplasm of breast: Secondary | ICD-10-CM

## 2013-05-07 ENCOUNTER — Other Ambulatory Visit: Payer: Self-pay | Admitting: Obstetrics and Gynecology

## 2013-05-07 DIAGNOSIS — R928 Other abnormal and inconclusive findings on diagnostic imaging of breast: Secondary | ICD-10-CM

## 2013-05-08 ENCOUNTER — Ambulatory Visit (AMBULATORY_SURGERY_CENTER): Payer: 59 | Admitting: Gastroenterology

## 2013-05-08 ENCOUNTER — Encounter: Payer: Self-pay | Admitting: Gastroenterology

## 2013-05-08 VITALS — BP 109/72 | HR 50 | Temp 97.4°F | Resp 16 | Ht 62.0 in | Wt 175.0 lb

## 2013-05-08 DIAGNOSIS — Z86010 Personal history of colonic polyps: Secondary | ICD-10-CM

## 2013-05-08 MED ORDER — SODIUM CHLORIDE 0.9 % IV SOLN: 500.0000 mL | INTRAVENOUS | Status: DC

## 2013-05-08 NOTE — Progress Notes (Signed)
Procedure ends, to recovery awake, report given and VSS. 

## 2013-05-08 NOTE — Op Note (Signed)
[facility] [address] [place], [address]   COLONOSCOPY PROCEDURE REPORT  PATIENT: Angel Williamson, Angel Williamson  MR#: [id number] BIRTHDATE: 06-01-1956 , 57  yrs. old GENDER: Female ENDOSCOPIST: Louis Meckel, MD REFERRED BY: PROCEDURE DATE:  05/08/2013 PROCEDURE:   Colonoscopy, diagnostic ASA CLASS:   Class II INDICATIONS:Patient's personal history of adenomatous colon polyps. adenomatous polyps 2003 and 2007 MEDICATIONS: MAC sedation, administered by CRNA and Propofol (Diprivan) 230 mg IV  DESCRIPTION OF PROCEDURE:   After the risks benefits and alternatives of the procedure were thoroughly explained, informed consent was obtained.  A digital rectal exam revealed no abnormalities of the rectum.   The [id number]  endoscope was introduced through the anus and advanced to the cecum, which was identified by both the appendix and ileocecal valve. No adverse events experienced.   The quality of the prep was excellent using Suprep  The instrument was then slowly withdrawn as the colon was fully examined.      COLON FINDINGS: A normal appearing cecum, ileocecal valve, and appendiceal orifice were identified.  The ascending, hepatic flexure, transverse, splenic flexure, descending, sigmoid colon and rectum appeared unremarkable.  No polyps or cancers were seen. Retroflexed views revealed no abnormalities. The time to cecum=3 minutes 13 seconds.  Withdrawal time=6 minutes 12 seconds.  The scope was withdrawn and the procedure completed. COMPLICATIONS: There were no complications.  ENDOSCOPIC IMPRESSION: Normal colon  RECOMMENDATIONS: Repeat Colonscopy in 10 years.   eSigned:  Louis Meckel, MD 05/08/2013 9:25 AM   cc: Pecola Lawless, MD and Chauncey Reading, MD

## 2013-05-08 NOTE — Patient Instructions (Signed)
YOU HAD AN ENDOSCOPIC PROCEDURE TODAY AT THE [facility]: Refer to the procedure report that was given to you for any specific questions about what was found during the examination.  If the procedure report does not answer your questions, please call your gastroenterologist to clarify.  If you requested that your care partner not be given the details of your procedure findings, then the procedure report has been included in a sealed envelope for you to review at your convenience later.  YOU SHOULD EXPECT: Some feelings of bloating in the abdomen. Passage of more gas than usual.  Walking can help get rid of the air that was put into your GI tract during the procedure and reduce the bloating. If you had a lower endoscopy (such as a colonoscopy or flexible sigmoidoscopy) you may notice spotting of blood in your stool or on the toilet paper. If you underwent a bowel prep for your procedure, then you may not have a normal bowel movement for a few days.  DIET: Your first meal following the procedure should be a light meal and then it is ok to progress to your normal diet.  A half-sandwich or bowl of soup is an example of a good first meal.  Heavy or fried foods are harder to digest and may make you feel nauseous or bloated.  Likewise meals heavy in dairy and vegetables can cause extra gas to form and this can also increase the bloating.  Drink plenty of fluids but you should avoid alcoholic beverages for 24 hours.  ACTIVITY: Your care partner should take you home directly after the procedure.  You should plan to take it easy, moving slowly for the rest of the day.  You can resume normal activity the day after the procedure however you should NOT DRIVE or use heavy machinery for 24 hours (because of the sedation medicines used during the test).    SYMPTOMS TO REPORT IMMEDIATELY: A gastroenterologist can be reached at any hour.  During normal business hours, 8:30 AM to 5:00 PM Monday through Friday,  call [phone].  After hours and on weekends, please call the GI answering service at [phone] who will take a message and have the physician on call contact you.   Following lower endoscopy (colonoscopy or flexible sigmoidoscopy):  Excessive amounts of blood in the stool  Significant tenderness or worsening of abdominal pains  Swelling of the abdomen that is new, acute  Fever of 100F or higher    FOLLOW UP: If any biopsies were taken you will be contacted by phone or by letter within the next 1-3 weeks.  Call your gastroenterologist if you have not heard about the biopsies in 3 weeks.  Our staff will call the home number listed on your records the next business day following your procedure to check on you and address any questions or concerns that you may have at that time regarding the information given to you following your procedure. This is a courtesy call and so if there is no answer at the home number and we have not heard from you through the emergency physician on call, we will assume that you have returned to your regular daily activities without incident.  SIGNATURES/CONFIDENTIALITY: You and/or your care partner have signed paperwork which will be entered into your electronic medical record.  These signatures attest to the fact that that the information above on your After Visit Summary has been reviewed and is understood.  Full responsibility of the confidentiality   of this discharge information lies with you and/or your care-partner.     

## 2013-05-08 NOTE — Progress Notes (Signed)
Patient did not experience any of the following events: a burn prior to discharge; a fall within the facility; wrong site/side/patient/procedure/implant event; or a hospital transfer or hospital admission upon discharge from the facility. (G8907) Patient did not have preoperative order for IV antibiotic SSI prophylaxis. (G8918)  

## 2013-05-11 ENCOUNTER — Telehealth: Payer: Self-pay | Admitting: *Deleted

## 2013-05-11 NOTE — Telephone Encounter (Signed)
  Follow up Call-  Call back number 05/08/2013  Post procedure Call Back phone  # [phone]  Permission to leave phone message Yes    [facility]

## 2013-05-20 ENCOUNTER — Ambulatory Visit
Admission: RE | Admit: 2013-05-20 | Discharge: 2013-05-20 | Disposition: A | Discharge status: Home / Self Care | Payer: 59 | Source: Ambulatory Visit | Attending: Obstetrics and Gynecology | Admitting: Obstetrics and Gynecology

## 2013-05-20 DIAGNOSIS — R928 Other abnormal and inconclusive findings on diagnostic imaging of breast: Secondary | ICD-10-CM

## 2013-06-08 ENCOUNTER — Other Ambulatory Visit: Payer: Self-pay | Admitting: Internal Medicine

## 2013-07-20 ENCOUNTER — Other Ambulatory Visit: Payer: Self-pay | Admitting: Internal Medicine

## 2013-09-09 ENCOUNTER — Other Ambulatory Visit: Payer: Self-pay | Admitting: Internal Medicine

## 2013-09-09 NOTE — Telephone Encounter (Signed)
Diovan refilled. 

## 2013-10-19 ENCOUNTER — Other Ambulatory Visit: Payer: Self-pay | Admitting: Internal Medicine

## 2013-10-20 NOTE — Telephone Encounter (Signed)
Spironolactone refill refused, pt needs OV (last seen 03/2012). JG//CMA

## 2013-10-26 ENCOUNTER — Telehealth: Payer: Self-pay | Admitting: Internal Medicine

## 2013-10-26 ENCOUNTER — Other Ambulatory Visit: Payer: Self-pay | Admitting: *Deleted

## 2013-10-26 MED ORDER — SPIRONOLACTONE 25 MG PO TABS: ORAL_TABLET | ORAL | Status: DC

## 2013-10-26 NOTE — Telephone Encounter (Signed)
Patient called stating she needs refill on spironolactone. She has CPE for this Friday 1/2/5. Pt uses [facility]

## 2013-10-27 ENCOUNTER — Telehealth: Payer: Self-pay

## 2013-10-27 NOTE — Telephone Encounter (Signed)
Done. JG//CMA 

## 2013-10-27 NOTE — Telephone Encounter (Signed)
Medication and allergies:  Reviewed and updated  90 day supply/mail order: na Local pharmacy: [facility]   Immunizations due:  Flu and tdap  A/P:   No changes to FH or PSH Pap-Dr Ambrose Mantle  [facility]--04/2013--follow up 6 months Bone Density--06/2012 [facility]--04/2013--Dr Kaplan--next due 2024  To Discuss with Provider: Not at this time

## 2013-10-30 ENCOUNTER — Encounter: Payer: 59 | Admitting: Internal Medicine

## 2013-11-04 ENCOUNTER — Ambulatory Visit (INDEPENDENT_AMBULATORY_CARE_PROVIDER_SITE_OTHER): Payer: 59 | Admitting: Internal Medicine

## 2013-11-04 ENCOUNTER — Encounter: Payer: Self-pay | Admitting: Internal Medicine

## 2013-11-04 VITALS — BP 125/81 | HR 88 | Temp 98.4°F | Resp 18 | Wt 181.0 lb

## 2013-11-04 DIAGNOSIS — M5136 Other intervertebral disc degeneration, lumbar region: Secondary | ICD-10-CM

## 2013-11-04 DIAGNOSIS — M5137 Other intervertebral disc degeneration, lumbosacral region: Secondary | ICD-10-CM

## 2013-11-04 DIAGNOSIS — M899 Disorder of bone, unspecified: Secondary | ICD-10-CM

## 2013-11-04 DIAGNOSIS — I10 Essential (primary) hypertension: Secondary | ICD-10-CM

## 2013-11-04 DIAGNOSIS — Z86010 Personal history of colonic polyps: Secondary | ICD-10-CM

## 2013-11-04 DIAGNOSIS — M949 Disorder of cartilage, unspecified: Secondary | ICD-10-CM

## 2013-11-04 MED ORDER — VALSARTAN 320 MG PO TABS: ORAL_TABLET | ORAL | Status: DC

## 2013-11-04 MED ORDER — SPIRONOLACTONE 25 MG PO TABS: ORAL_TABLET | ORAL | Status: DC

## 2013-11-04 NOTE — Progress Notes (Signed)
Subjective:    Patient ID: Angel Williamson, female    DOB: 05-24-1956, 58 y.o.   MRN: [id number]  HPI Blood pressure rarely monitored. BP 110/82 on both ; 131/88 on ARB only.  Compliant with anti hypertemsive medication. Not on sodium restriction. No lightheadedness or other adverse medication effect described.   Hydrochlorothiazide has caused hypokalemia. She developed a cough with ACE inhibitors.  Her last renal function studies were 6/13: Creatinine 0.9 and potassium 3.9.  There is no personal or family history of kidney/bladder issues.       Review of Systems Significant headaches, epistaxis, chest pain, palpitations, exertional dyspnea, claudication, paroxysmal nocturnal dyspnea, or edema absent.  She's had right LS area pain for approximately 3 weeks. This is intermittent , dull up to level 4, & non radiating. There was no trigger such as repetitive motion or lifting for this pain. This discomfort is better if she puts pressure on this area. She denies any change in color or temperature in this area or associated rash. She has no associated abdominal symptoms of pain, unexplained weight loss, bowel changes, melena, rectal bleeding. She feels that the flank pain may be related to the angiotensin receptor blocker. When she does not take the spironolactone she has some discomfort in her thighs.  She not denies significant extremity weakness, numbness, or tingling. There is no radicular pain from the back into the legs.  She also denies incontinence of urine or stool.  She also denies dysuria, pyuria, or hematuria  At her most recent gynecologic visit dipstick suggested microscopic hematuria. Apparently when the urine was spun down no red cells were found.      Objective:   Physical Exam  Gen.: Healthy and well-nourished in appearance. Alert, appropriate and cooperative throughout exam.Appears younger than stated age  Head: Normocephalic without obvious abnormalities  Eyes:  No corneal or conjunctival inflammation noted. Pupils equal round reactive to light and accommodation.  Neck: No deformities, masses, or tenderness noted. Range of motion normal.  Lungs: Normal respiratory effort; chest expands symmetrically. Lungs are clear to auscultation without rales, wheezes, or increased work of breathing. Heart: Normal rate and rhythm. Normal S1 and S2. No gallop, click, or rub. S4 w/o murmur. Abdomen: Bowel sounds normal; abdomen soft and nontender. No masses, organomegaly or hernias noted.Striae peri umbilically                               Musculoskeletal/extremities: No deformity or scoliosis noted of  the thoracic or lumbar spine.   No clubbing, cyanosis, edema, or significant extremity  deformity noted. Range of motion normal .Tone & strength normal. Hand joints normal . Fingernail health good. Able to lie down & sit up w/o help. Negative SLR bilaterally Vascular: Carotid, radial artery, dorsalis pedis and  posterior tibial pulses are full and equal. No bruits present. Neurologic: Alert and oriented x3. Deep tendon reflexes symmetrical and normal.  Gait normal  including heel & toe walking .  Skin: Intact without suspicious lesions or rashes. Lymph: No cervical, axillary lymphadenopathy present. Psych: Mood and affect are normal. Normally interactive  Assessment & Plan:  #1 hypertension, control appears to be good. Renal function needs to be reassessed  #2 right lumbosacral musculoskeletal pain; this is not related to the medications. No neurologic deficit noted  #3 history of microscopic hematuria on dipstick apparently not documented on spun urine

## 2013-11-04 NOTE — Patient Instructions (Addendum)
Your next office appointment will be determined based upon review of your pending labs . Those instructions will be transmitted to you through My Chart  or by mail if you're not using this system.  Cardiovascular exercise, this can be as simple a program as walking, is recommended 30-45 minutes 3-4 times per week. If you're not exercising you should take 6-8 weeks to build up to this level.   Followup as needed for your this acute issue. Please report any significant change in your symptoms. Minimal Blood Pressure Goal= AVERAGE < 140/90;  Ideal is an AVERAGE < 135/85. This AVERAGE should be calculated from @ least 5-7 BP readings taken @ different times of day on different days of week. You should not respond to isolated BP readings , but rather the AVERAGE for that week .Please bring your  blood pressure cuff to office visits to verify that it is reliable.It  can also be checked against the blood pressure device at the pharmacy. Finger or wrist cuffs are not dependable; an arm cuff is. The best exercises for the low back include freestyle swimming, stretch aerobics, and yoga.  Eat a low-fat diet with lots of fruits and vegetables, up to 7-9 servings per day. Consume less than  30  Grams (preferably ZERO) of sugar per day from foods & drinks with High Fructose Corn Syrup (HFCS) sugar as #1,2,3 or # 4 on label.[employer], [employer] do not carry products with HFCS. Follow a  low carb nutrition program such as [place] or The New Sugar Busters  to prevent Diabetes risk.

## 2013-11-04 NOTE — Progress Notes (Signed)
Pre-visit discussion using our clinic review tool. No additional management support is needed unless otherwise documented below in the visit note.  

## 2013-11-04 NOTE — Addendum Note (Signed)
Addended by: Harl Bowie on: 11/04/2013 03:14 PM   Modules accepted: Orders

## 2013-11-05 ENCOUNTER — Telehealth: Payer: Self-pay | Admitting: Internal Medicine

## 2013-11-05 ENCOUNTER — Other Ambulatory Visit: Payer: 59

## 2013-11-05 NOTE — Telephone Encounter (Signed)
Relevant patient education mailed to patient.  

## 2013-11-06 ENCOUNTER — Other Ambulatory Visit (INDEPENDENT_AMBULATORY_CARE_PROVIDER_SITE_OTHER): Payer: 59

## 2013-11-06 DIAGNOSIS — M5137 Other intervertebral disc degeneration, lumbosacral region: Secondary | ICD-10-CM

## 2013-11-06 DIAGNOSIS — M949 Disorder of cartilage, unspecified: Secondary | ICD-10-CM

## 2013-11-06 DIAGNOSIS — M5136 Other intervertebral disc degeneration, lumbar region: Secondary | ICD-10-CM

## 2013-11-06 DIAGNOSIS — Z86010 Personal history of colonic polyps: Secondary | ICD-10-CM

## 2013-11-06 DIAGNOSIS — M899 Disorder of bone, unspecified: Secondary | ICD-10-CM

## 2013-11-06 DIAGNOSIS — I10 Essential (primary) hypertension: Secondary | ICD-10-CM

## 2013-11-06 LAB — BASIC METABOLIC PANEL
BUN: 17 mg/dL (ref 6–23)
CALCIUM: 9.3 mg/dL (ref 8.4–10.5)
CHLORIDE: 103 meq/L (ref 96–112)
CO2: 27 meq/L (ref 19–32)
Creatinine, Ser: 0.9 mg/dL (ref 0.4–1.2)
GFR: 66.69 mL/min (ref >60.00)
GLUCOSE: 114 mg/dL — AB (ref 70–99)
Potassium: 3.5 mEq/L (ref 3.5–5.1)
SODIUM: 137 meq/L (ref 135–145)

## 2013-11-06 LAB — TSH: TSH: 1.24 u[IU]/mL (ref 0.35–5.50)

## 2013-11-11 ENCOUNTER — Encounter: Payer: Self-pay | Admitting: *Deleted

## 2013-11-13 LAB — VITAMIN D 1,25 DIHYDROXY
VITAMIN D3 1, 25 (OH): 59 pg/mL
Vitamin D 1, 25 (OH)2 Total: 59 pg/mL (ref 18–72)
Vitamin D2 1, 25 (OH)2: <8 pg/mL

## 2013-11-20 ENCOUNTER — Encounter: Payer: 59 | Admitting: Internal Medicine

## 2013-12-29 ENCOUNTER — Other Ambulatory Visit: Payer: Self-pay | Admitting: Obstetrics and Gynecology

## 2013-12-29 DIAGNOSIS — N63 Unspecified lump in unspecified breast: Secondary | ICD-10-CM

## 2014-01-06 ENCOUNTER — Ambulatory Visit
Admission: RE | Admit: 2014-01-06 | Discharge: 2014-01-06 | Disposition: A | Discharge status: Home / Self Care | Payer: 59 | Source: Ambulatory Visit | Attending: Obstetrics and Gynecology | Admitting: Obstetrics and Gynecology

## 2014-01-06 DIAGNOSIS — N63 Unspecified lump in unspecified breast: Secondary | ICD-10-CM

## 2014-01-15 ENCOUNTER — Ambulatory Visit (INDEPENDENT_AMBULATORY_CARE_PROVIDER_SITE_OTHER): Payer: 59 | Admitting: Family Medicine

## 2014-01-15 ENCOUNTER — Encounter: Payer: Self-pay | Admitting: Family Medicine

## 2014-01-15 VITALS — BP 118/80 | HR 79 | Temp 98.2°F | Resp 16 | Wt 182.4 lb

## 2014-01-15 DIAGNOSIS — J019 Acute sinusitis, unspecified: Secondary | ICD-10-CM

## 2014-01-15 MED ORDER — AMOXICILLIN 875 MG PO TABS: 875.0000 mg | ORAL_TABLET | Freq: Two times a day (BID) | ORAL | Status: DC

## 2014-01-15 NOTE — Progress Notes (Signed)
Pre visit review using our clinic review tool, if applicable. No additional management support is needed unless otherwise documented below in the visit note. 

## 2014-01-15 NOTE — Progress Notes (Signed)
   Subjective:    Patient ID: Angel Williamson, female    DOB: 01/23/1956, 58 y.o.   MRN: [id number]  Sinusitis Associated symptoms include headaches.  Headache   Sore Throat  Associated symptoms include headaches.   URI- sxs started ~10 days ago w/ a HA.  + facial pain/pressure.  Started Claritin w/o relief.  + nasal congestion, PND- worse at nights.  No fevers.  Minimal cough.  No ear pain.  + tooth pain.  No known sick contacts.  No N/V/D.   Review of Systems  Neurological: Positive for headaches.   For ROS see HPI     Objective:   Physical Exam  Vitals reviewed. Constitutional: She appears well-developed and well-nourished. No distress.  HENT:  Head: Normocephalic and atraumatic.  Right Ear: Tympanic membrane normal.  Left Ear: Tympanic membrane normal.  Nose: Mucosal edema and rhinorrhea present. Right sinus exhibits maxillary sinus tenderness and frontal sinus tenderness. Left sinus exhibits maxillary sinus tenderness and frontal sinus tenderness.  Mouth/Throat: Uvula is midline and mucous membranes are normal. Posterior oropharyngeal erythema present. No oropharyngeal exudate.  Eyes: Conjunctivae and EOM are normal. Pupils are equal, round, and reactive to light.  Neck: Normal range of motion. Neck supple.  Cardiovascular: Normal rate, regular rhythm and normal heart sounds.   Pulmonary/Chest: Effort normal and breath sounds normal. No respiratory distress. She has no wheezes.  Lymphadenopathy:    She has no cervical adenopathy.          Assessment & Plan:

## 2014-01-15 NOTE — Assessment & Plan Note (Signed)
Pt's sxs and PE consistent w/ infxn.  Start abx.  Reviewed supportive care and red flags that should prompt return.  Pt expressed understanding and is in agreement w/ plan.  

## 2014-01-15 NOTE — Patient Instructions (Signed)
Follow up as needed Start the Amoxicillin twice daily- take w/ food Drink plenty of fluids REST! Hang in there!! (Happy Early Birthday!)

## 2014-03-08 ENCOUNTER — Other Ambulatory Visit: Payer: Self-pay

## 2014-03-08 DIAGNOSIS — I10 Essential (primary) hypertension: Secondary | ICD-10-CM

## 2014-03-08 MED ORDER — VALSARTAN 320 MG PO TABS: ORAL_TABLET | ORAL | Status: DC

## 2014-05-19 ENCOUNTER — Ambulatory Visit (INDEPENDENT_AMBULATORY_CARE_PROVIDER_SITE_OTHER): Payer: 59 | Admitting: Internal Medicine

## 2014-05-19 ENCOUNTER — Encounter: Payer: Self-pay | Admitting: Internal Medicine

## 2014-05-19 ENCOUNTER — Other Ambulatory Visit (INDEPENDENT_AMBULATORY_CARE_PROVIDER_SITE_OTHER): Payer: 59

## 2014-05-19 VITALS — BP 122/78 | HR 91 | Temp 98.2°F | Wt 180.0 lb

## 2014-05-19 DIAGNOSIS — R9431 Abnormal electrocardiogram [ECG] [EKG]: Secondary | ICD-10-CM

## 2014-05-19 DIAGNOSIS — I479 Paroxysmal tachycardia, unspecified: Secondary | ICD-10-CM

## 2014-05-19 DIAGNOSIS — I499 Cardiac arrhythmia, unspecified: Secondary | ICD-10-CM

## 2014-05-19 DIAGNOSIS — F411 Generalized anxiety disorder: Secondary | ICD-10-CM

## 2014-05-19 LAB — CBC WITH DIFFERENTIAL/PLATELET
BASOS PCT: 0.4 % (ref 0.0–3.0)
Basophils Absolute: 0 10*3/uL (ref 0.0–0.1)
EOS ABS: 0.1 10*3/uL (ref 0.0–0.7)
EOS PCT: 1.3 % (ref 0.0–5.0)
HCT: 36.5 % (ref 36.0–46.0)
HEMOGLOBIN: 12.4 g/dL (ref 12.0–15.0)
LYMPHS PCT: 21.4 % (ref 12.0–46.0)
Lymphs Abs: 1.4 10*3/uL (ref 0.7–4.0)
MCHC: 33.8 g/dL (ref 30.0–36.0)
MCV: 91.6 fl (ref 78.0–100.0)
MONOS PCT: 6.8 % (ref 3.0–12.0)
Monocytes Absolute: 0.4 10*3/uL (ref 0.1–1.0)
NEUTROS ABS: 4.6 10*3/uL (ref 1.4–7.7)
NEUTROS PCT: 70.1 % (ref 43.0–77.0)
Platelets: 263 10*3/uL (ref 150.0–400.0)
RBC: 3.99 Mil/uL (ref 3.87–5.11)
RDW: 12.7 % (ref 11.5–15.5)
WBC: 6.5 10*3/uL (ref 4.0–10.5)

## 2014-05-19 LAB — BASIC METABOLIC PANEL
BUN: 18 mg/dL (ref 6–23)
CHLORIDE: 105 meq/L (ref 96–112)
CO2: 27 meq/L (ref 19–32)
CREATININE: 0.9 mg/dL (ref 0.4–1.2)
Calcium: 9.9 mg/dL (ref 8.4–10.5)
GFR: 68.28 mL/min (ref >60.00)
GLUCOSE: 109 mg/dL — AB (ref 70–99)
POTASSIUM: 3.7 meq/L (ref 3.5–5.1)
Sodium: 141 mEq/L (ref 135–145)

## 2014-05-19 LAB — TSH: TSH: 1.17 u[IU]/mL (ref 0.35–4.50)

## 2014-05-19 LAB — MAGNESIUM: Magnesium: 1.9 mg/dL (ref 1.5–2.5)

## 2014-05-19 MED ORDER — LORAZEPAM 0.5 MG PO TABS: ORAL_TABLET | ORAL | Status: DC

## 2014-05-19 NOTE — Progress Notes (Signed)
Pre visit review using our clinic review tool, if applicable. No additional management support is needed unless otherwise documented below in the visit note. 

## 2014-05-19 NOTE — Patient Instructions (Addendum)
Your next office appointment will be determined based upon review of your pending labs . Those instructions will be transmitted to you through My Chart   To prevent palpitations or rapid heart, avoid stimulants such as decongestants, diet pills, nicotine, or caffeine (coffee, tea, cola, or chocolate) to excess. Take the EKG to any emergency room or preop visits. There are nonspecific changes; as long as there is no new change these are not clinically significant . If the old EKG is not available for comparison; it may result in unnecessary hospitalization for observation with significant unnecessary expense.

## 2014-05-19 NOTE — Progress Notes (Signed)
   Subjective:    Patient ID: Angel Williamson, female    DOB: 17-May-1956, 58 y.o.   MRN: [id number]  HPI Pt presents today for sensation of racing heart. The patient has had episodes of racing heart twice in the past week. The first episode occurred approximately 2 weeks ago at which time the patient awoke and felt her heart racing. The symptom did not wake her. The symptom lasted 45 minutes. There was no associated nausea, chest pain, arm or jaw pain, diaphoresis, SOB, or dizziness. The second episode occurred on 05/15/14 when the patient was sitting on the couch watching TV. Again the symptoms lasted 45 minutes and there was no associated cardiac, respiratory or neurologic symptoms.  The pt does report she has been more anxious than usual recently but denies feeling down or depressed. She states she often feels overly nervous. Her husband recently had surgery and she is in the midst of trying to sell her house. She specifically denies suicidal thoughts.   She denies any skin or nail changes or diarrhea. Her last TSH was 1.24 on 11/06/13. Her last EKG was 03/2012.    Review of Systems  Constitutional: Negative for fever and chills.  Respiratory: Negative for apnea and shortness of breath.   Cardiovascular: Negative for chest pain and palpitations.  Gastrointestinal: Negative for nausea and diarrhea.  Neurological: Negative for dizziness and numbness.  Psychiatric/Behavioral: Negative for suicidal ideas. The patient is nervous/anxious.       Objective:   Physical Exam Normal PE        Assessment & Plan:  #subjective tachycardia

## 2014-05-19 NOTE — Progress Notes (Signed)
   Subjective:    Patient ID: Angel Williamson, female    DOB: 02/08/1956, 58 y.o.   MRN: [id number]  HPI   She is here to evaluate "racing heart". She has had 2 episodes in the past week..  The first episode occurred 2 weeks ago when she awoke and felt that the heart was beating rapidly. The symptoms did not wake her but present upon awakening. This lasted 45 minutes.  There were no associated symptoms of nausea, chest pain, dyspnea or diaphoresis.  The most recent episode was 05/15/14 while sitting on the couch. Again symptoms lasted 45 minutes without associated  symptoms.  She has curtailed her walking exercise program because of the heat. She is not having palpitations or heart racing with fat exercise.  She does describe some increased anxiety recently but denies depression or panic attacks. Stressors include her husbands recent surgery and them trying to sell her home.    Review of Systems    Chest pain, palpitations, or exertional dyspnea, paroxysmal nocturnal dyspnea, claudication or edema are absent.  She specifically denies paroxysmal headache with the tachycardia. She does not monitor her blood pressure.  She denies any nail or skin changes. TSH was therapeutic in January of this year.        Objective:   Physical Exam  Appears healthy and well-nourished & in no acute distress  Slight ptosis of the right eye. No lid lag or proptosis  No carotid bruits are present.No neck pain distention present at 10 - 15 degrees. Thyroid normal to palpation  Heart rhythm and rate are normal with no gallop or murmur  Chest is clear with no increased work of breathing  There is no evidence of aortic aneurysm or renal artery bruits  Abdomen soft with no organomegaly or masses. No HJR  No clubbing, cyanosis or edema present. No onycholysis  Pedal pulses are intact   No ischemic skin changes are present . Fingernails/ toenails healthy . No onycholysis.  Alert and oriented.  Strength, tone, DTRs reflexes normal. No tremor present.          Assessment & Plan:  #1 paroxysmal subjective tachycardia lasting 45 minutes x2 at rest  #2 nonspecific T changes manifested as low to biphasic or inverted T waves in leads 3, aVF, V4-V6  Plan: See orders. Stress test to assess the nonspecific ST-T changes.

## 2014-05-28 ENCOUNTER — Other Ambulatory Visit: Payer: Self-pay

## 2014-05-28 DIAGNOSIS — I10 Essential (primary) hypertension: Secondary | ICD-10-CM

## 2014-05-28 MED ORDER — SPIRONOLACTONE 25 MG PO TABS: ORAL_TABLET | ORAL | Status: DC

## 2014-07-06 ENCOUNTER — Other Ambulatory Visit: Payer: Self-pay | Admitting: Obstetrics and Gynecology

## 2014-07-06 DIAGNOSIS — N63 Unspecified lump in unspecified breast: Secondary | ICD-10-CM

## 2014-07-09 ENCOUNTER — Encounter: Payer: Self-pay | Admitting: Nurse Practitioner

## 2014-07-09 ENCOUNTER — Ambulatory Visit (INDEPENDENT_AMBULATORY_CARE_PROVIDER_SITE_OTHER): Payer: 59 | Admitting: Nurse Practitioner

## 2014-07-09 VITALS — BP 141/86 | HR 79

## 2014-07-09 DIAGNOSIS — I479 Paroxysmal tachycardia, unspecified: Secondary | ICD-10-CM

## 2014-07-09 NOTE — Progress Notes (Signed)
Exercise Treadmill Test  Pre-Exercise Testing Evaluation Rhythm: normal sinus  Rate: 84 bpm     Test  Exercise Tolerance Test Ordering MD: Candee Furbish, MD  Interpreting MD: Truitt Merle, NP  Unique Test No: 1  Treadmill:  1  Indication for ETT: Tahycardia  Contraindication to ETT: No   Stress Modality: exercise - treadmill  Cardiac Imaging Performed: non   Protocol: standard Bruce - maximal  Max BP:  173/72  Max MPHR (bpm):  162 85% MPR (bpm):  138  MPHR obtained (bpm):  160 % MPHR obtained:  98%  Reached 85% MPHR (min:sec):  2:23 Total Exercise Time (min-sec):  5:15  Workload in METS:  7 Borg Scale: 17  Reason ETT Terminated:  patient's desire to stop    ST Segment Analysis At Rest: normal ST segments - no evidence of significant ST depression With Exercise: no evidence of significant ST depression  Other Information Arrhythmia:  No Angina during ETT:  absent (0) Quality of ETT:  diagnostic  ETT Interpretation:  normal - no evidence of ischemia by ST analysis  Comments: Patient presents today for routine GXT. Referred for tachycardia - has had 2 prior spells earlier this summer - occurred at night - felt to be related to stress. Now improved.  No active chest pain. Has HTN Today the patient exercised on the standard Bruce protocol for a total of 5:15 minutes.  Reduced exercise tolerance.  Adequt blood pressure response.  Clinically negative for chest pain. Test was stopped due to fatigue.  EKG negative for ischemia. No arrhythmia noted.   Recommendations: CV risk factor modification.  See back prn.   Patient is agreeable to this plan and will call if any problems develop in the interim.   Burtis Junes, RN, [place] [address] [place], [place]  [phone]

## 2014-07-20 ENCOUNTER — Ambulatory Visit
Admission: RE | Admit: 2014-07-20 | Discharge: 2014-07-20 | Disposition: A | Discharge status: Home / Self Care | Payer: 59 | Source: Ambulatory Visit | Attending: Obstetrics and Gynecology | Admitting: Obstetrics and Gynecology

## 2014-07-20 DIAGNOSIS — N63 Unspecified lump in unspecified breast: Secondary | ICD-10-CM

## 2014-08-16 ENCOUNTER — Other Ambulatory Visit: Payer: Self-pay

## 2014-08-16 MED ORDER — FLUTICASONE PROPIONATE 50 MCG/ACT NA SUSP: NASAL | Status: DC

## 2014-08-26 ENCOUNTER — Other Ambulatory Visit: Payer: Self-pay

## 2014-08-26 DIAGNOSIS — I10 Essential (primary) hypertension: Secondary | ICD-10-CM

## 2014-08-26 MED ORDER — VALSARTAN 320 MG PO TABS: ORAL_TABLET | ORAL | Status: DC

## 2015-02-21 ENCOUNTER — Other Ambulatory Visit: Payer: Self-pay

## 2015-02-21 DIAGNOSIS — I10 Essential (primary) hypertension: Secondary | ICD-10-CM

## 2015-02-21 MED ORDER — VALSARTAN 320 MG PO TABS: ORAL_TABLET | ORAL | Status: DC

## 2015-05-20 ENCOUNTER — Other Ambulatory Visit: Payer: Self-pay | Admitting: Emergency Medicine

## 2015-05-20 DIAGNOSIS — I10 Essential (primary) hypertension: Secondary | ICD-10-CM

## 2015-05-20 MED ORDER — SPIRONOLACTONE 25 MG PO TABS: ORAL_TABLET | ORAL | Status: DC

## 2015-08-18 ENCOUNTER — Other Ambulatory Visit: Payer: Self-pay | Admitting: *Deleted

## 2015-08-18 DIAGNOSIS — I10 Essential (primary) hypertension: Secondary | ICD-10-CM

## 2015-08-18 NOTE — Telephone Encounter (Signed)
Pharmacy left msg on triage stating pt is trying to get a refill  On her Valsartan. Refill was denied because pt need appt last saw md 04/2014...[name]

## 2015-08-22 ENCOUNTER — Telehealth: Payer: Self-pay | Admitting: *Deleted

## 2015-08-22 DIAGNOSIS — I10 Essential (primary) hypertension: Secondary | ICD-10-CM

## 2015-08-22 MED ORDER — SPIRONOLACTONE 25 MG PO TABS: ORAL_TABLET | ORAL | Status: DC

## 2015-08-22 NOTE — Telephone Encounter (Signed)
Receive call pt states she ios needing at least 4 pills on her spironolactone until appt on Thursday. If she does not take she will start to swell. Pt has appt this Thurs 08/25/15 inform pt can only send #4 until appt. [facility].../lmb

## 2015-08-25 ENCOUNTER — Encounter: Payer: Self-pay | Admitting: Internal Medicine

## 2015-08-25 ENCOUNTER — Other Ambulatory Visit (INDEPENDENT_AMBULATORY_CARE_PROVIDER_SITE_OTHER): Payer: 59

## 2015-08-25 ENCOUNTER — Ambulatory Visit (INDEPENDENT_AMBULATORY_CARE_PROVIDER_SITE_OTHER): Payer: 59 | Admitting: Internal Medicine

## 2015-08-25 VITALS — BP 116/76 | HR 68 | Temp 98.2°F | Resp 16 | Ht 65.25 in | Wt 182.0 lb

## 2015-08-25 DIAGNOSIS — Z86010 Personal history of colonic polyps: Secondary | ICD-10-CM | POA: Diagnosis not present

## 2015-08-25 DIAGNOSIS — R7309 Other abnormal glucose: Secondary | ICD-10-CM

## 2015-08-25 DIAGNOSIS — I10 Essential (primary) hypertension: Secondary | ICD-10-CM | POA: Diagnosis not present

## 2015-08-25 LAB — CBC WITH DIFFERENTIAL/PLATELET
Basophils Absolute: 0 10*3/uL (ref 0.0–0.1)
Basophils Relative: 0.6 % (ref 0.0–3.0)
EOS PCT: 2.4 % (ref 0.0–5.0)
Eosinophils Absolute: 0.2 10*3/uL (ref 0.0–0.7)
HCT: 35.6 % — ABNORMAL LOW (ref 36.0–46.0)
Hemoglobin: 12 g/dL (ref 12.0–15.0)
LYMPHS ABS: 1.8 10*3/uL (ref 0.7–4.0)
Lymphocytes Relative: 22.2 % (ref 12.0–46.0)
MCHC: 33.6 g/dL (ref 30.0–36.0)
MCV: 90.3 fl (ref 78.0–100.0)
MONO ABS: 0.7 10*3/uL (ref 0.1–1.0)
MONOS PCT: 8.7 % (ref 3.0–12.0)
NEUTROS ABS: 5.2 10*3/uL (ref 1.4–7.7)
NEUTROS PCT: 66.1 % (ref 43.0–77.0)
PLATELETS: 303 10*3/uL (ref 150.0–400.0)
RBC: 3.94 Mil/uL (ref 3.87–5.11)
RDW: 12.7 % (ref 11.5–15.5)
WBC: 7.9 10*3/uL (ref 4.0–10.5)

## 2015-08-25 LAB — BASIC METABOLIC PANEL
BUN: 23 mg/dL (ref 6–23)
CO2: 31 meq/L (ref 19–32)
Calcium: 10.1 mg/dL (ref 8.4–10.5)
Chloride: 103 mEq/L (ref 96–112)
Creatinine, Ser: 1.15 mg/dL (ref 0.40–1.20)
GFR: 51.23 mL/min — AB (ref >60.00)
GLUCOSE: 110 mg/dL — AB (ref 70–99)
POTASSIUM: 4.4 meq/L (ref 3.5–5.1)
SODIUM: 139 meq/L (ref 135–145)

## 2015-08-25 LAB — HEMOGLOBIN A1C: HEMOGLOBIN A1C: 6.1 % (ref 4.6–6.5)

## 2015-08-25 MED ORDER — SPIRONOLACTONE 25 MG PO TABS: ORAL_TABLET | ORAL | Status: DC

## 2015-08-25 NOTE — Progress Notes (Signed)
Pre visit review using our clinic review tool, if applicable. No additional management support is needed unless otherwise documented below in the visit note. 

## 2015-08-25 NOTE — Progress Notes (Signed)
   Subjective:    Patient ID: Angel Williamson, female    DOB: 10/18/1956, 59 y.o.   MRN: [id number]  HPI The patient is here to assess status of active health conditions.  PMH, FH, & Social History reviewed & updated.No change in [place] as recorded.  She has been compliant with her medicines without adverse effects. She does not restrict red meat, fried foods,or salt. She walks 30 minutes every other day without cardio pulmonary symptoms. Blood pressure averages less than 120/80.  Colonoscopy was completed 2014. There is a history of colon polyps twice previously. She has no active GI symptoms  Her recent glucoses have ranged from 109-114. There is  family history of diabetes in maternal grandfather. Last lipids were 2013. LDL was 71 and HDL was 42.7.   Review of Systems  Chest pain, palpitations, tachycardia, exertional dyspnea, paroxysmal nocturnal dyspnea, claudication or edema are absent. No unexplained weight loss, abdominal pain, significant dyspepsia, dysphagia, melena, rectal bleeding, or persistently small caliber stools. Dysuria, pyuria, hematuria, frequency, nocturia or polyuria are denied. Change in hair, skin, nails denied. No bowel changes of constipation or diarrhea. No intolerance to heat or cold.     Objective:   Physical Exam  Pertinent or positive findings include: Slight ptosis suggested bilaterally. She has slight crepitus of the knees. There is suggestion of varus changes of the knees.  General appearance :adequately nourished; in no distress.  Eyes: No conjunctival inflammation or scleral icterus is present.  Oral exam:  Lips and gums are healthy appearing.There is no oropharyngeal erythema or exudate noted. Dental hygiene is good.  Heart:  Normal rate and regular rhythm. S1 and S2 normal without gallop, murmur, click, rub or other extra sounds    Lungs:Chest clear to auscultation; no wheezes, rhonchi,rales ,or rubs present.No increased work of breathing.    Abdomen: bowel sounds normal, soft and non-tender without masses, organomegaly or hernias noted.  No guarding or rebound.   Vascular : all pulses equal ; no bruits present.  Skin:Warm & dry.  Intact without suspicious lesions or rashes ; no tenting or jaundice   Lymphatic: No lymphadenopathy is noted about the head, neck, axilla.   Neuro: Strength, tone & DTRs normal.     Assessment & Plan:  See Current Assessment & Plan in Problem List under specific Diagnosis

## 2015-08-25 NOTE — Assessment & Plan Note (Signed)
A1c

## 2015-08-25 NOTE — Assessment & Plan Note (Signed)
CBC

## 2015-08-25 NOTE — Patient Instructions (Signed)
Minimal Blood Pressure Goal= AVERAGE < 140/90;  Ideal is an AVERAGE < 135/85. This AVERAGE should be calculated from @ least 5-7 BP readings taken @ different times of day on different days of week. You should not respond to isolated BP readings , but rather the AVERAGE for that week .Please bring your  blood pressure cuff to office visits to verify that it is reliable.It  can also be checked against the blood pressure device at the pharmacy. Finger or wrist cuffs are not dependable; an arm cuff is.  HDL goal is > 40 in men & > 50 in women.Interventions to raise HDL or GOOD cholesterol include: exercising 30-45 minutes 3-4 X per week ; including dietary sources of Omega 3 fatty acids ( salmon & tuna) or  supplementing with Flax seed  1-2 grams per day. The B vitamin Niacin raises HDL but has not been shown to decrease heart attack or stroke risks in extensive trials & is not recommended.  Your next office appointment will be determined based upon review of your pending labs .  Those written interpretation of the lab results and instructions will be transmitted to you by mail for your records.  Critical results will be called.   Followup as needed for any active or acute issue. Please report any significant change in your symptoms.

## 2015-08-25 NOTE — Assessment & Plan Note (Signed)
Blood pressure goals reviewed. BMET 

## 2015-08-26 ENCOUNTER — Telehealth: Payer: Self-pay | Admitting: *Deleted

## 2015-08-26 NOTE — Telephone Encounter (Signed)
OK to continue Spironolactone but check renal function every 6 mos

## 2015-08-26 NOTE — Telephone Encounter (Signed)
Notified pt with md response.../lmb 

## 2015-08-26 NOTE — Telephone Encounter (Signed)
Left msg on triage stating saw md yesterday & he told her to hold one of her medication until he get results back from labs. Pt is wanting to know results & should she resume med...[name]

## 2015-08-28 ENCOUNTER — Other Ambulatory Visit: Payer: Self-pay | Admitting: Internal Medicine

## 2015-08-28 DIAGNOSIS — D649 Anemia, unspecified: Secondary | ICD-10-CM

## 2015-09-05 ENCOUNTER — Telehealth: Payer: Self-pay | Admitting: *Deleted

## 2015-09-05 DIAGNOSIS — I10 Essential (primary) hypertension: Secondary | ICD-10-CM

## 2015-09-05 MED ORDER — VALSARTAN 320 MG PO TABS: ORAL_TABLET | ORAL | Status: DC

## 2015-09-05 NOTE — Telephone Encounter (Signed)
Receive call pt states she is needing refills on her BP med Valsartan. She has move & [facility] [place]. Inform sent electronically...[name]

## 2016-01-11 ENCOUNTER — Other Ambulatory Visit: Payer: Self-pay

## 2016-01-11 ENCOUNTER — Ambulatory Visit (INDEPENDENT_AMBULATORY_CARE_PROVIDER_SITE_OTHER): Payer: 59 | Admitting: Internal Medicine

## 2016-01-11 ENCOUNTER — Encounter: Payer: Self-pay | Admitting: Internal Medicine

## 2016-01-11 VITALS — BP 120/72 | HR 63 | Temp 98.0°F | Resp 16 | Wt 188.0 lb

## 2016-01-11 DIAGNOSIS — R0683 Snoring: Secondary | ICD-10-CM | POA: Diagnosis not present

## 2016-01-11 DIAGNOSIS — E669 Obesity, unspecified: Secondary | ICD-10-CM | POA: Diagnosis not present

## 2016-01-11 DIAGNOSIS — Z1231 Encounter for screening mammogram for malignant neoplasm of breast: Secondary | ICD-10-CM

## 2016-01-11 DIAGNOSIS — R7303 Prediabetes: Secondary | ICD-10-CM | POA: Insufficient documentation

## 2016-01-11 NOTE — Patient Instructions (Signed)
A referral has been ordered for pulmonary.  You should hear from [place] regarding this appointment.

## 2016-01-11 NOTE — Assessment & Plan Note (Signed)
Advised working on weight loss Currently doing minimal exercise

## 2016-01-11 NOTE — Progress Notes (Signed)
Subjective:    Patient ID: Angel Williamson, female    DOB: 1956-06-15, 60 y.o.   MRN: [id number]  HPI She is here for an acute visit for snoring.    She has always snored, but it has gotten worse per her husband.  She has gained some weight which may be making it worse.  She denies sleepiness during the day.  She does wake up in the middle of the night, but denies chocking or gasping.  The snoring is so loud her husband does on sleeping with her anymore.  No witnessed apnea, but she sleeps alone.  She wakes up with a dry mouth.  She denies regular or morning headaches.  She does have nasal congestion and uses a nasal spray, but typically in the morning only.  She denies inability to breath through her nose at night.   Medications and allergies reviewed with patient and updated if appropriate.  Patient Active Problem List   Diagnosis Date Noted  . Prediabetes 01/11/2016  . Skin cancer, basal cell 04/18/2012  . HYPERGLYCEMIA, FASTING 12/13/2009  . COLONIC POLYPS, HX OF 12/13/2009  . OSTEOPENIA 03/29/2009  . ANEMIA-NOS 11/14/2007  . Essential hypertension 11/14/2007    Current Outpatient Prescriptions on File Prior to Visit  Medication Sig Dispense Refill  . Calcium Carb-Cholecalciferol (CALCIUM 1000 + D PO) Take by mouth daily.    . cholecalciferol (VITAMIN D) 1000 UNITS tablet Take 1,000 Units by mouth daily.    . fluticasone (FLONASE) 50 MCG/ACT nasal spray USE 1 SPRAY IN EACH NOSTRIL TWICE A DAY AS NEEDED 16 g 5  . LORazepam (ATIVAN) 0.5 MG tablet q 8-12 hrs prn only, NOT routinely 30 tablet 0  . spironolactone (ALDACTONE) 25 MG tablet TAKE 1 TABLET BY MOUTH DAILY 90 tablet 3  . valsartan (DIOVAN) 320 MG tablet TAKE 1/2 TAB EVERY DAY 45 tablet 3   No current facility-administered medications on file prior to visit.    Past Medical History  Diagnosis Date  . Osteopenia   . Hypertension   . Breast fibrocystic disorder   . History of colonic polyps     adenomatous  .  Calcaneus fracture 2010    no surgery  . Obesity     Past Surgical History  Procedure Laterality Date  . Colonoscopy w/ polypectomy      adenomatous polyps 2003 & 2007 Due 2012. Dr.Kaplan   . G 2 p 2    . Breast cyst aspiration    . Colonoscopy  2014    negative  . No prior surgery      Social History   Social History  . Marital Status: Married    Spouse Name: N/A  . Number of Children: N/A  . Years of Education: N/A   Social History Main Topics  . Smoking status: Former Smoker    Quit date: 10/29/1984  . Smokeless tobacco: Never Used  . Alcohol Use: No  . Drug Use: No  . Sexual Activity: Not on file   Other Topics Concern  . Not on file   Social History Narrative    Family History  Problem Relation Age of Onset  . Cancer Paternal Grandmother     stomach  . Stomach cancer Paternal Grandmother   . Diabetes Maternal Grandfather   . Stroke Paternal Grandfather 75  . Colon polyps Father   . Anemia Mother   . Anemia Other     Cousin   . Colon cancer Neg Hx   .  Esophageal cancer Neg Hx   . Rectal cancer Neg Hx     Review of Systems  Constitutional: Negative for fatigue.  HENT: Positive for postnasal drip. Negative for congestion.   Respiratory: Negative for cough, shortness of breath and wheezing.   Cardiovascular: Negative for chest pain, palpitations and leg swelling.  Gastrointestinal:       No GERD  Neurological: Negative for headaches.       Objective:   Filed Vitals:   01/11/16 0932  BP: 120/72  Pulse: 63  Temp: 98 F (36.7 C)  Resp: 16   Filed Weights   01/11/16 0932  Weight: 188 lb (85.276 kg)   Body mass index is 31.06 kg/(m^2).   Physical Exam GENERAL APPEARANCE: Appears stated age, well appearing, NAD HEENT: bilateral tympanic membranes and ear canals normal, oropharynx with no erythema, small airway, no thyromegaly, trachea midline, no cervical or supraclavicular lymphadenopathy LUNGS: Clear to auscultation without wheeze or  crackles, unlabored breathing, good air entry bilaterally HEART: Normal S1,S2 without murmurs EXTREMITIES: Without clubbing, cyanosis, or edema      Assessment & Plan:   Snoring, getting worse Concern for possible sleep apnea - no other symptoms besides the snoring No fatigue, headaches, choking/gasping at night or witnessed apnea (but husband no longer sleeps in the same room) Will refer to pulm for possible sleep study Work on weight loss Will discuss with dentist as well - may be interested in mouth guard if needed Try flonase at night.

## 2016-01-11 NOTE — Progress Notes (Signed)
Pre visit review using our clinic review tool, if applicable. No additional management support is needed unless otherwise documented below in the visit note. 

## 2016-01-25 ENCOUNTER — Ambulatory Visit: Payer: 59

## 2016-02-01 ENCOUNTER — Ambulatory Visit
Admission: RE | Admit: 2016-02-01 | Discharge: 2016-02-01 | Disposition: A | Discharge status: Home / Self Care | Payer: 59 | Source: Ambulatory Visit

## 2016-02-01 DIAGNOSIS — Z1231 Encounter for screening mammogram for malignant neoplasm of breast: Secondary | ICD-10-CM

## 2016-05-10 ENCOUNTER — Other Ambulatory Visit: Payer: Self-pay | Admitting: Obstetrics and Gynecology

## 2016-05-10 DIAGNOSIS — M8580 Other specified disorders of bone density and structure, unspecified site: Secondary | ICD-10-CM

## 2016-05-18 ENCOUNTER — Ambulatory Visit
Admission: RE | Admit: 2016-05-18 | Discharge: 2016-05-18 | Disposition: A | Discharge status: Home / Self Care | Payer: 59 | Source: Ambulatory Visit | Attending: Obstetrics and Gynecology | Admitting: Obstetrics and Gynecology

## 2016-05-18 DIAGNOSIS — M8580 Other specified disorders of bone density and structure, unspecified site: Secondary | ICD-10-CM

## 2016-05-22 ENCOUNTER — Encounter: Payer: Self-pay | Admitting: Pulmonary Disease

## 2016-05-22 ENCOUNTER — Ambulatory Visit (INDEPENDENT_AMBULATORY_CARE_PROVIDER_SITE_OTHER): Payer: 59 | Admitting: Pulmonary Disease

## 2016-05-22 VITALS — BP 122/78 | HR 75 | Ht 62.0 in | Wt 187.0 lb

## 2016-05-22 DIAGNOSIS — R0683 Snoring: Secondary | ICD-10-CM | POA: Diagnosis not present

## 2016-05-22 NOTE — Patient Instructions (Signed)
Will arrange for home sleep study Will call to arrange for follow up after sleep study reviewed  

## 2016-05-22 NOTE — Progress Notes (Signed)
   Subjective:    Patient ID: Angel Williamson, female    DOB: 1956-10-25, 60 y.o.   MRN: [id number]  HPI    Review of Systems  Constitutional: Positive for unexpected weight change. Negative for fever.  HENT: Negative for congestion, dental problem, ear pain, nosebleeds, postnasal drip, rhinorrhea, sinus pressure, sneezing, sore throat and trouble swallowing.   Eyes: Negative for redness and itching.  Respiratory: Negative for cough, chest tightness, shortness of breath and wheezing.   Cardiovascular: Negative for palpitations and leg swelling.  Gastrointestinal: Negative for nausea and vomiting.  Genitourinary: Negative for dysuria.  Musculoskeletal: Negative for joint swelling.  Skin: Negative for rash.  Neurological: Negative for headaches.  Hematological: Does not bruise/bleed easily.  Psychiatric/Behavioral: Negative for dysphoric mood. The patient is not nervous/anxious.        Objective:   Physical Exam        Assessment & Plan:

## 2016-05-22 NOTE — Progress Notes (Signed)
Past Surgical History She  has a past surgical history that includes Colonoscopy w/ polypectomy; G 2 P 2; Breast cyst aspiration; Colonoscopy (2014); and no prior surgery.  Allergies  Allergen Reactions  . Hydrochlorothiazide     REACTION: low potassium  . Benazepril Hcl     REACTION: cough    Family History Her family history includes Anemia in her mother and other; Cancer in her paternal grandmother; Colon polyps in her father; Diabetes in her maternal grandfather; Stomach cancer in her paternal grandmother; Stroke (age of onset: 2) in her paternal grandfather.  Social History She  reports that she quit smoking about 29 years ago. Her smoking use included Cigarettes. She has a 12.00 pack-year smoking history. She has never used smokeless tobacco. She reports that she does not drink alcohol or use drugs.  Review of systems Constitutional: Positive for unexpected weight change. Negative for fever.  HENT: Negative for congestion, dental problem, ear pain, nosebleeds, postnasal drip, rhinorrhea, sinus pressure, sneezing, sore throat and trouble swallowing.   Eyes: Negative for redness and itching.  Respiratory: Negative for cough, chest tightness, shortness of breath and wheezing.   Cardiovascular: Negative for palpitations and leg swelling.  Gastrointestinal: Negative for nausea and vomiting.  Genitourinary: Negative for dysuria.  Musculoskeletal: Negative for joint swelling.  Skin: Negative for rash.  Neurological: Negative for headaches.  Hematological: Does not bruise/bleed easily.  Psychiatric/Behavioral: Negative for dysphoric mood. The patient is not nervous/anxious.     Current Outpatient Prescriptions on File Prior to Visit  Medication Sig  . Calcium Carb-Cholecalciferol (CALCIUM 1000 + D PO) Take by mouth daily.  . cholecalciferol (VITAMIN D) 1000 UNITS tablet Take 1,000 Units by mouth daily.  Marland Kitchen spironolactone (ALDACTONE) 25 MG tablet TAKE 1 TABLET BY MOUTH DAILY  .  valsartan (DIOVAN) 320 MG tablet TAKE 1/2 TAB EVERY DAY  . fluticasone (FLONASE) 50 MCG/ACT nasal spray USE 1 SPRAY IN EACH NOSTRIL TWICE A DAY AS NEEDED (Patient not taking: Reported on 05/22/2016)   No current facility-administered medications on file prior to visit.     Chief Complaint  Patient presents with  . Sleep Consult    Referred by Dr Billey Gosling for snoring. Epworth Score: 4    Tests:  Past medical history She  has a past medical history of Breast fibrocystic disorder; Calcaneus fracture (2010); History of colonic polyps; Hypertension; Obesity; and Osteopenia.  Vital signs BP 122/78 (BP Location: Right Arm, Cuff Size: Normal)   Pulse 75   Ht 5\' 2"  (1.575 m)   Wt 187 lb (84.8 kg)   SpO2 95%   BMI 34.20 kg/m   History of Present Illness Angel Williamson is a 60 y.o. female for evaluation of sleep problems.  Her husband has been concerned about her snoring.  This has been getting worse.  She was seen by her PCP and is on two medications for her blood pressure.  She goes to sleep between 10 and 11 pm.  She falls asleep in 15 minutes.  She wakes up 2 or 3 times during the night, but is not sure why.  She can usually fall back to sleep quickly.  Her mouth will get sore in the morning.  She gets out of bed at 7 am, but feels like she could stay in bed longer.  She feels better in the morning after drinking a sugary, caffeine beverage.  She denies morning headache.  She does not use anything to help her fall sleep.  She denies  sleep walking, sleep talking, bruxism, or nightmares.  There is no history of restless legs.  She denies sleep hallucinations, sleep paralysis, or cataplexy.  The Epworth score is 4 out of 24.   Physical Exam:  General - No distress ENT - No sinus tenderness, no oral exudate, no LAN, no thyromegaly, TM clear, pupils equal/reactive, MP 4, enlarged tongue Cardiac - s1s2 regular, no murmur, pulses symmetric Chest - No wheeze/rales/dullness, good air  entry, normal respiratory excursion Back - No focal tenderness Abd - Soft, non-tender, no organomegaly, + bowel sounds Ext - No edema Neuro - Normal strength, cranial nerves intact Skin - No rashes Psych - Normal mood, and behavior  Discussion: She has snoring, sleep disruption, and daytime sleepiness.  She has hx of hypertension on two medications.  I am concerned she could have obstructive sleep apnea.  We discussed how sleep apnea can affect various health problems, including risks for hypertension, cardiovascular disease, and diabetes.  We also discussed how sleep disruption can increase risks for accidents, such as while driving.  Weight loss as a means of improving sleep apnea was also reviewed.  Additional treatment options discussed were CPAP therapy, oral appliance, and surgical intervention.  Assessment/plan:  Snoring. - will arrange for home sleep study pending insurance approval - depending on results, she might be a good candidate for oral appliance  Obesity. - discussed importance of weight loss   Patient Instructions  Will arrange for home sleep study Will call to arrange for follow up after sleep study reviewed     Chesley Mires, M.D. Pager [phone] 05/22/2016, 3:40 PM

## 2016-06-04 DIAGNOSIS — G4733 Obstructive sleep apnea (adult) (pediatric): Secondary | ICD-10-CM | POA: Diagnosis not present

## 2016-06-06 ENCOUNTER — Telehealth: Payer: Self-pay | Admitting: Pulmonary Disease

## 2016-06-06 DIAGNOSIS — G4733 Obstructive sleep apnea (adult) (pediatric): Secondary | ICD-10-CM

## 2016-06-06 NOTE — Telephone Encounter (Signed)
Patient notified of Sleep Study results. Order for CPAP placed. Patient will call to schedule 6 week follow up after CPAP has been received. Nothing further needed.

## 2016-06-06 NOTE — Telephone Encounter (Signed)
HST 06/04/16 >> AHI 46.8, SaO2 low 72%.  Will have my nurse inform pt that sleep study shows severe sleep apnea.  Options are 1) CPAP now, 2) ROV first.  If She is agreeable to CPAP, then please send order for auto CPAP range 5 to 15 cm H2O with heated humidity and mask of choice.  Have download sent 1 month after starting CPAP and set up ROV 2 months after starting CPAP.  ROV can be with me or nurse practitioner.

## 2016-06-07 DIAGNOSIS — G4733 Obstructive sleep apnea (adult) (pediatric): Secondary | ICD-10-CM | POA: Diagnosis not present

## 2016-06-08 ENCOUNTER — Other Ambulatory Visit: Payer: Self-pay | Admitting: *Deleted

## 2016-06-08 DIAGNOSIS — R0683 Snoring: Secondary | ICD-10-CM

## 2016-08-14 ENCOUNTER — Other Ambulatory Visit: Payer: Self-pay | Admitting: Internal Medicine

## 2016-08-14 DIAGNOSIS — I10 Essential (primary) hypertension: Secondary | ICD-10-CM

## 2016-08-15 ENCOUNTER — Other Ambulatory Visit: Payer: Self-pay | Admitting: Internal Medicine

## 2016-08-15 DIAGNOSIS — I10 Essential (primary) hypertension: Secondary | ICD-10-CM

## 2016-08-17 ENCOUNTER — Other Ambulatory Visit: Payer: Self-pay | Admitting: *Deleted

## 2016-08-17 DIAGNOSIS — I10 Essential (primary) hypertension: Secondary | ICD-10-CM

## 2016-08-17 MED ORDER — SPIRONOLACTONE 25 MG PO TABS: ORAL_TABLET | ORAL | 0 refills | Status: DC

## 2016-08-17 MED ORDER — VALSARTAN 320 MG PO TABS: ORAL_TABLET | ORAL | 0 refills | Status: DC

## 2016-08-17 NOTE — Telephone Encounter (Signed)
Pt left msg on triage stating she is needing refill on her spironolactone & valsartan. She has appt on 10/25, but is currently out of medications. Called pt back inform [facility],/[facility]mb

## 2016-08-22 ENCOUNTER — Encounter: Payer: Self-pay | Admitting: Internal Medicine

## 2016-08-22 ENCOUNTER — Other Ambulatory Visit (INDEPENDENT_AMBULATORY_CARE_PROVIDER_SITE_OTHER): Payer: 59

## 2016-08-22 ENCOUNTER — Ambulatory Visit (INDEPENDENT_AMBULATORY_CARE_PROVIDER_SITE_OTHER): Payer: 59 | Admitting: Internal Medicine

## 2016-08-22 VITALS — BP 132/84 | HR 69 | Temp 98.4°F | Resp 16 | Ht 62.0 in | Wt 189.0 lb

## 2016-08-22 DIAGNOSIS — I10 Essential (primary) hypertension: Secondary | ICD-10-CM | POA: Diagnosis not present

## 2016-08-22 DIAGNOSIS — R7303 Prediabetes: Secondary | ICD-10-CM | POA: Diagnosis not present

## 2016-08-22 DIAGNOSIS — Z6834 Body mass index (BMI) 34.0-34.9, adult: Secondary | ICD-10-CM

## 2016-08-22 DIAGNOSIS — E6609 Other obesity due to excess calories: Secondary | ICD-10-CM

## 2016-08-22 DIAGNOSIS — G4733 Obstructive sleep apnea (adult) (pediatric): Secondary | ICD-10-CM | POA: Insufficient documentation

## 2016-08-22 LAB — COMPREHENSIVE METABOLIC PANEL
ALBUMIN: 4.5 g/dL (ref 3.5–5.2)
ALT: 14 U/L (ref 0–35)
AST: 14 U/L (ref 0–37)
Alkaline Phosphatase: 79 U/L (ref 39–117)
BILIRUBIN TOTAL: 0.5 mg/dL (ref 0.2–1.2)
BUN: 18 mg/dL (ref 6–23)
CO2: 29 mEq/L (ref 19–32)
CREATININE: 0.95 mg/dL (ref 0.40–1.20)
Calcium: 10.2 mg/dL (ref 8.4–10.5)
Chloride: 103 mEq/L (ref 96–112)
GFR: 63.65 mL/min (ref >60.00)
Glucose, Bld: 100 mg/dL — ABNORMAL HIGH (ref 70–99)
Potassium: 4.4 mEq/L (ref 3.5–5.1)
SODIUM: 139 meq/L (ref 135–145)
Total Protein: 8 g/dL (ref 6.0–8.3)

## 2016-08-22 LAB — TSH: TSH: 1.48 u[IU]/mL (ref 0.35–4.50)

## 2016-08-22 LAB — HEMOGLOBIN A1C: Hgb A1c MFr Bld: 6.1 % (ref 4.6–6.5)

## 2016-08-22 MED ORDER — VALSARTAN 320 MG PO TABS: ORAL_TABLET | ORAL | 3 refills | Status: DC

## 2016-08-22 MED ORDER — SPIRONOLACTONE 25 MG PO TABS
ORAL_TABLET | ORAL | 3 refills | Status: DC
Start: 2016-08-22 — End: 2017-08-13

## 2016-08-22 NOTE — Progress Notes (Signed)
Pre visit review using our clinic review tool, if applicable. No additional management support is needed unless otherwise documented below in the visit note. 

## 2016-08-22 NOTE — Progress Notes (Signed)
Subjective:    Patient ID: Angel Williamson, female    DOB: Aug 05, 1956, 60 y.o.   MRN: [id number]  HPI She is here for follow up.  Hypertension: She is taking her medication daily. She is compliant with a low sodium diet.  She denies chest pain, palpitations, shortness of breath and regular headaches.  She does have occasional leg edema.  She is exercising some - walking.  She does monitor her blood pressure at home on occasion and it is controlled.    OSA:  Since she was here last she has been diagnosed with severe sleep apnea.  She has been cpap for two months.  She thinks she feels better.    Prediabetes:  She is compliant with a low sugar/carbohydrate diet.  She is exercising regularly-Some walking.   Medications and allergies reviewed with patient and updated if appropriate.  Patient Active Problem List   Diagnosis Date Noted  . OSA (obstructive sleep apnea), Severe 08/22/2016  . Prediabetes 01/11/2016  . Obesity 01/11/2016  . Skin cancer, basal cell 04/18/2012  . HYPERGLYCEMIA, FASTING 12/13/2009  . COLONIC POLYPS, HX OF 12/13/2009  . OSTEOPENIA 03/29/2009  . ANEMIA-NOS 11/14/2007  . Essential hypertension 11/14/2007    Current Outpatient Prescriptions on File Prior to Visit  Medication Sig Dispense Refill  . Calcium Carb-Cholecalciferol (CALCIUM 1000 + D PO) Take by mouth daily.    . cholecalciferol (VITAMIN D) 1000 UNITS tablet Take 1,000 Units by mouth daily.    . fluticasone (FLONASE) 50 MCG/ACT nasal spray USE 1 SPRAY IN EACH NOSTRIL TWICE A DAY AS NEEDED 16 g 5  . spironolactone (ALDACTONE) 25 MG tablet TAKE 1 TABLET BY MOUTH DAILY Keep 08/22/16 appt for future refills 7 tablet 0  . valsartan (DIOVAN) 320 MG tablet TAKE 1/2 TAB EVERY DAY 7 tablet 0   No current facility-administered medications on file prior to visit.     Past Medical History:  Diagnosis Date  . Breast fibrocystic disorder   . Calcaneus fracture 2010   no surgery  . History of colonic  polyps    adenomatous  . Hypertension   . Obesity   . Osteopenia     Past Surgical History:  Procedure Laterality Date  . BREAST CYST ASPIRATION    . COLONOSCOPY  2014   negative  . COLONOSCOPY W/ POLYPECTOMY     adenomatous polyps 2003 & 2007 Due 2012. Dr.Kaplan   . G 2 P 2    . no prior surgery      Social History   Social History  . Marital status: Married    Spouse name: N/A  . Number of children: N/A  . Years of education: N/A   Occupational History  . office assistant    Social History Main Topics  . Smoking status: Former Smoker    Packs/day: 1.00    Years: 12.00    Types: Cigarettes    Quit date: 10/29/1986  . Smokeless tobacco: Never Used  . Alcohol use No  . Drug use: No  . Sexual activity: Not Asked   Other Topics Concern  . None   Social History Narrative  . None    Family History  Problem Relation Age of Onset  . Cancer Paternal Grandmother     stomach  . Stomach cancer Paternal Grandmother   . Diabetes Maternal Grandfather   . Stroke Paternal Grandfather 90  . Colon polyps Father   . Anemia Mother   . Anemia Other  Cousin   . Colon cancer Neg Hx   . Esophageal cancer Neg Hx   . Rectal cancer Neg Hx     Review of Systems  Constitutional: Negative for chills and fever.  Respiratory: Negative for cough, shortness of breath and wheezing.   Cardiovascular: Positive for leg swelling (mild). Negative for chest pain and palpitations.  Neurological: Negative for dizziness, light-headedness and headaches.       Objective:   Vitals:   08/22/16 1557  BP: 132/84  Pulse: 69  Resp: 16  Temp: 98.4 F (36.9 C)   Filed Weights   08/22/16 1557  Weight: 189 lb (85.7 kg)   Body mass index is 34.57 kg/m.   Physical Exam Constitutional: Appears well-developed and well-nourished. No distress.  HENT:  Head: Normocephalic and atraumatic.  Neck: Neck supple. No tracheal deviation present. No thyromegaly present.  No cervical  lymphadenopathy Cardiovascular: Normal rate, regular rhythm and normal heart sounds.   No murmur heard. No carotid bruit .  trace edema Pulmonary/Chest: Effort normal and breath sounds normal. No respiratory distress. No has no wheezes. No rales.  Skin: Skin is warm and dry. Not diaphoretic.  Psychiatric: Normal mood and affect. Behavior is normal.       Assessment & Plan:   See Problem List for Assessment and Plan of chronic medical problems.   F/u in one year, sooner depending on blood work

## 2016-08-22 NOTE — Assessment & Plan Note (Signed)
BP well controlled Current regimen effective and well tolerated Continue current medications at current doses Cmp, tsh 

## 2016-08-22 NOTE — Assessment & Plan Note (Signed)
Using cpap nightly - stressed compliance

## 2016-08-22 NOTE — Assessment & Plan Note (Signed)
Obesity with prediabetes and hypertension Stressed weight loss Regular exercise Decreased portions, healthy diet

## 2016-08-22 NOTE — Patient Instructions (Signed)
  Test(s) ordered today. Your results will be released to [place] (or called to you) after review, usually within 72hours after test completion. If any changes need to be made, you will be notified at that same time.  All other Health Maintenance issues reviewed.   All recommended immunizations and age-appropriate screenings are up-to-date or discussed.  No immunizations administered today.   Medications reviewed and updated.  No changes recommended at this time.  Your prescription(s) have been submitted to your pharmacy. Please take as directed and contact our office if you believe you are having problem(s) with the medication(s).  Please followup in one year for a physical

## 2016-08-22 NOTE — Assessment & Plan Note (Signed)
Discussed the importance of a low sugar/carbohydrate diet Stressed weight loss Discussed the importance of regular exercise Check A1c today

## 2016-08-28 ENCOUNTER — Ambulatory Visit (INDEPENDENT_AMBULATORY_CARE_PROVIDER_SITE_OTHER): Payer: 59 | Admitting: Pulmonary Disease

## 2016-08-28 ENCOUNTER — Encounter: Payer: Self-pay | Admitting: Pulmonary Disease

## 2016-08-28 ENCOUNTER — Other Ambulatory Visit: Payer: Self-pay | Admitting: *Deleted

## 2016-08-28 VITALS — BP 122/60 | HR 60 | Ht 62.0 in | Wt 187.0 lb

## 2016-08-28 DIAGNOSIS — G4733 Obstructive sleep apnea (adult) (pediatric): Secondary | ICD-10-CM

## 2016-08-28 DIAGNOSIS — I10 Essential (primary) hypertension: Secondary | ICD-10-CM

## 2016-08-28 MED ORDER — VALSARTAN 320 MG PO TABS: ORAL_TABLET | ORAL | 3 refills | Status: DC

## 2016-08-28 NOTE — Patient Instructions (Signed)
Follow up in 1 year.

## 2016-08-28 NOTE — Telephone Encounter (Signed)
Left msg on triage stating MD had suppose to sent in refills on medications. Received 1 but not the valsartan. Per chart [facility], resent electronically...[name]

## 2016-08-28 NOTE — Progress Notes (Signed)
Current Outpatient Prescriptions on File Prior to Visit  Medication Sig  . Calcium Carb-Cholecalciferol (CALCIUM 1000 + D PO) Take by mouth daily.  . cholecalciferol (VITAMIN D) 1000 UNITS tablet Take 1,000 Units by mouth daily.  Marland Kitchen spironolactone (ALDACTONE) 25 MG tablet TAKE 1 TABLET BY MOUTH DAILY  . valsartan (DIOVAN) 320 MG tablet TAKE 1/2 TAB EVERY DAY  . fluticasone (FLONASE) 50 MCG/ACT nasal spray USE 1 SPRAY IN EACH NOSTRIL TWICE A DAY AS NEEDED (Patient not taking: Reported on 08/28/2016)   No current facility-administered medications on file prior to visit.      Chief Complaint  Patient presents with  . Sleep Apnea    cpap follow up. pt states she is getting 7 hours of sleep. pt feels well rested during the day.      Sleep tests HST 06/04/16 >> AHI 46.8, SaO2 low 72% Auto CPAP 07/28/16 to 08/26/16 >> used on 30 of 30 nights with average 6 hrs 20 min.  Average AHI 0.4 with median CPAP 9 and 95 th percentile CPAP 11 cm H2O  Past medical history HTN  Past surgical history, Family history, Social history, Allergies all reviewed.  Vital Signs BP 122/60 (BP Location: Right Arm, Cuff Size: Normal)   Pulse 60   Ht 5\' 2"  (1.575 m)   Wt 187 lb (84.8 kg)   SpO2 98%   BMI 34.20 kg/m   History of Present Illness Angel Williamson is a 60 y.o. female with obstructive sleep apnea.  Since her last visit she had home sleep study.  This showed severe sleep apnea.  She was then started on auto CPAP.  She uses nasal mask.  She is sleeping much better, and no longer snores.  She feels more rested during the day.  Physical Exam  General - No distress ENT - No sinus tenderness, no oral exudate, no LAN, MP 3, enlarged tongue, elongated uvula Cardiac - s1s2 regular, no murmur Chest - No wheeze/rales/dullness Back - No focal tenderness Abd - Soft, non-tender Ext - No edema Neuro - Normal strength Skin - No rashes Psych - normal mood, and behavior   Assessment/Plan  Obstructive  sleep apnea. - she is compliant with CPAP and reports benefit - continue auto CPAP   Patient Instructions  Follow up in 1 year    Chesley Mires, MD [facility] Pager:  [phone] 08/28/2016, 12:27 PM

## 2016-11-21 DIAGNOSIS — G4733 Obstructive sleep apnea (adult) (pediatric): Secondary | ICD-10-CM | POA: Diagnosis not present

## 2016-12-22 DIAGNOSIS — G4733 Obstructive sleep apnea (adult) (pediatric): Secondary | ICD-10-CM | POA: Diagnosis not present

## 2016-12-26 DIAGNOSIS — G4733 Obstructive sleep apnea (adult) (pediatric): Secondary | ICD-10-CM | POA: Diagnosis not present

## 2017-01-19 DIAGNOSIS — G4733 Obstructive sleep apnea (adult) (pediatric): Secondary | ICD-10-CM | POA: Diagnosis not present

## 2017-02-19 DIAGNOSIS — G4733 Obstructive sleep apnea (adult) (pediatric): Secondary | ICD-10-CM | POA: Diagnosis not present

## 2017-03-21 DIAGNOSIS — G4733 Obstructive sleep apnea (adult) (pediatric): Secondary | ICD-10-CM | POA: Diagnosis not present

## 2017-03-27 DIAGNOSIS — G4733 Obstructive sleep apnea (adult) (pediatric): Secondary | ICD-10-CM | POA: Diagnosis not present

## 2017-06-17 ENCOUNTER — Telehealth: Payer: Self-pay | Admitting: Internal Medicine

## 2017-06-17 MED ORDER — LOSARTAN POTASSIUM 100 MG PO TABS: 100.0000 mg | ORAL_TABLET | Freq: Every day | ORAL | 3 refills | Status: DC

## 2017-06-17 NOTE — Telephone Encounter (Signed)
Losartan sent to pharmacy - this is the same type of medication as valsartan.

## 2017-06-17 NOTE — Telephone Encounter (Signed)
Patient requesting medication in place of valsartan.  [facility] [place].  Patient concerned about being placed on correct med with taking spironolactone as well.

## 2017-06-18 NOTE — Telephone Encounter (Signed)
Notified pt MD [facility].../[facility]

## 2017-06-25 DIAGNOSIS — G4733 Obstructive sleep apnea (adult) (pediatric): Secondary | ICD-10-CM | POA: Diagnosis not present

## 2017-07-21 ENCOUNTER — Encounter: Payer: Self-pay | Admitting: Internal Medicine

## 2017-07-21 NOTE — Patient Instructions (Addendum)
Test(s) ordered today. Your results will be released to MyChart (or called to you) after review, usually within 72hours after test completion. If any changes need to be made, you will be notified at that same time.  All other Health Maintenance issues reviewed.   All recommended immunizations and age-appropriate screenings are up-to-date or discussed.  No immunizations administered today.   Medications reviewed and updated.  No changes recommended at this time.  Your prescription(s) have been submitted to your pharmacy. Please take as directed and contact our office if you believe you are having problem(s) with the medication(s).  Please followup in 6 months   Health Maintenance, Female Adopting a healthy lifestyle and getting preventive care can go a long way to promote health and wellness. Talk with your health care provider about what schedule of regular examinations is right for you. This is a good chance for you to check in with your provider about disease prevention and staying healthy. In between checkups, there are plenty of things you can do on your own. Experts have done a lot of research about which lifestyle changes and preventive measures are most likely to keep you healthy. Ask your health care provider for more information. Weight and diet Eat a healthy diet  Be sure to include plenty of vegetables, fruits, low-fat dairy products, and lean protein.  Do not eat a lot of foods high in solid fats, added sugars, or salt.  Get regular exercise. This is one of the most important things you can do for your health. ? Most adults should exercise for at least 150 minutes each week. The exercise should increase your heart rate and make you sweat (moderate-intensity exercise). ? Most adults should also do strengthening exercises at least twice a week. This is in addition to the moderate-intensity exercise.  Maintain a healthy weight  Body mass index (BMI) is a measurement that can  be used to identify possible weight problems. It estimates body fat based on height and weight. Your health care provider can help determine your BMI and help you achieve or maintain a healthy weight.  For females 20 years of age and older: ? A BMI below 18.5 is considered underweight. ? A BMI of 18.5 to 24.9 is normal. ? A BMI of 25 to 29.9 is considered overweight. ? A BMI of 30 and above is considered obese.  Watch levels of cholesterol and blood lipids  You should start having your blood tested for lipids and cholesterol at 61 years of age, then have this test every 5 years.  You may need to have your cholesterol levels checked more often if: ? Your lipid or cholesterol levels are high. ? You are older than 61 years of age. ? You are at high risk for heart disease.  Cancer screening Lung Cancer  Lung cancer screening is recommended for adults 55-80 years old who are at high risk for lung cancer because of a history of smoking.  A yearly low-dose CT scan of the lungs is recommended for people who: ? Currently smoke. ? Have quit within the past 15 years. ? Have at least a 30-pack-year history of smoking. A pack year is smoking an average of one pack of cigarettes a day for 1 year.  Yearly screening should continue until it has been 15 years since you quit.  Yearly screening should stop if you develop a health problem that would prevent you from having lung cancer treatment.  Breast Cancer  Practice breast self-awareness. This   means understanding how your breasts normally appear and feel.  It also means doing regular breast self-exams. Let your health care provider know about any changes, no matter how small.  If you are in your 20s or 30s, you should have a clinical breast exam (CBE) by a health care provider every 1-3 years as part of a regular health exam.  If you are 40 or older, have a CBE every year. Also consider having a breast X-ray (mammogram) every year.  If you  have a family history of breast cancer, talk to your health care provider about genetic screening.  If you are at high risk for breast cancer, talk to your health care provider about having an MRI and a mammogram every year.  Breast cancer gene (BRCA) assessment is recommended for women who have family members with BRCA-related cancers. BRCA-related cancers include: ? Breast. ? Ovarian. ? Tubal. ? Peritoneal cancers.  Results of the assessment will determine the need for genetic counseling and BRCA1 and BRCA2 testing.  Cervical Cancer Your health care provider may recommend that you be screened regularly for cancer of the pelvic organs (ovaries, uterus, and vagina). This screening involves a pelvic examination, including checking for microscopic changes to the surface of your cervix (Pap test). You may be encouraged to have this screening done every 3 years, beginning at age 21.  For women ages 30-65, health care providers may recommend pelvic exams and Pap testing every 3 years, or they may recommend the Pap and pelvic exam, combined with testing for human papilloma virus (HPV), every 5 years. Some types of HPV increase your risk of cervical cancer. Testing for HPV may also be done on women of any age with unclear Pap test results.  Other health care providers may not recommend any screening for nonpregnant women who are considered low risk for pelvic cancer and who do not have symptoms. Ask your health care provider if a screening pelvic exam is right for you.  If you have had past treatment for cervical cancer or a condition that could lead to cancer, you need Pap tests and screening for cancer for at least 20 years after your treatment. If Pap tests have been discontinued, your risk factors (such as having a new sexual partner) need to be reassessed to determine if screening should resume. Some women have medical problems that increase the chance of getting cervical cancer. In these cases,  your health care provider may recommend more frequent screening and Pap tests.  Colorectal Cancer  This type of cancer can be detected and often prevented.  Routine colorectal cancer screening usually begins at 61 years of age and continues through 61 years of age.  Your health care provider may recommend screening at an earlier age if you have risk factors for colon cancer.  Your health care provider may also recommend using home test kits to check for hidden blood in the stool.  A small camera at the end of a tube can be used to examine your colon directly (sigmoidoscopy or colonoscopy). This is done to check for the earliest forms of colorectal cancer.  Routine screening usually begins at age 50.  Direct examination of the colon should be repeated every 5-10 years through 61 years of age. However, you may need to be screened more often if early forms of precancerous polyps or small growths are found.  Skin Cancer  Check your skin from head to toe regularly.  Tell your health care provider about any new   moles or changes in moles, especially if there is a change in a mole's shape or color.  Also tell your health care provider if you have a mole that is larger than the size of a pencil eraser.  Always use sunscreen. Apply sunscreen liberally and repeatedly throughout the day.  Protect yourself by wearing long sleeves, pants, a wide-brimmed hat, and sunglasses whenever you are outside.  Heart disease, diabetes, and high blood pressure  High blood pressure causes heart disease and increases the risk of stroke. High blood pressure is more likely to develop in: ? People who have blood pressure in the high end of the normal range (130-139/85-89 mm Hg). ? People who are overweight or obese. ? People who are African American.  If you are 18-39 years of age, have your blood pressure checked every 3-5 years. If you are 40 years of age or older, have your blood pressure checked every year.  You should have your blood pressure measured twice-once when you are at a hospital or clinic, and once when you are not at a hospital or clinic. Record the average of the two measurements. To check your blood pressure when you are not at a hospital or clinic, you can use: ? An automated blood pressure machine at a pharmacy. ? A home blood pressure monitor.  If you are between 55 years and 79 years old, ask your health care provider if you should take aspirin to prevent strokes.  Have regular diabetes screenings. This involves taking a blood sample to check your fasting blood sugar level. ? If you are at a normal weight and have a low risk for diabetes, have this test once every three years after 61 years of age. ? If you are overweight and have a high risk for diabetes, consider being tested at a younger age or more often. Preventing infection Hepatitis B  If you have a higher risk for hepatitis B, you should be screened for this virus. You are considered at high risk for hepatitis B if: ? You were born in a country where hepatitis B is common. Ask your health care provider which countries are considered high risk. ? Your parents were born in a high-risk country, and you have not been immunized against hepatitis B (hepatitis B vaccine). ? You have HIV or AIDS. ? You use needles to inject street drugs. ? You live with someone who has hepatitis B. ? You have had sex with someone who has hepatitis B. ? You get hemodialysis treatment. ? You take certain medicines for conditions, including cancer, organ transplantation, and autoimmune conditions.  Hepatitis C  Blood testing is recommended for: ? Everyone born from 1945 through 1965. ? Anyone with known risk factors for hepatitis C.  Sexually transmitted infections (STIs)  You should be screened for sexually transmitted infections (STIs) including gonorrhea and chlamydia if: ? You are sexually active and are younger than 61 years of  age. ? You are older than 61 years of age and your health care provider tells you that you are at risk for this type of infection. ? Your sexual activity has changed since you were last screened and you are at an increased risk for chlamydia or gonorrhea. Ask your health care provider if you are at risk.  If you do not have HIV, but are at risk, it may be recommended that you take a prescription medicine daily to prevent HIV infection. This is called pre-exposure prophylaxis (PrEP). You are considered at risk   if: ? You are sexually active and do not regularly use condoms or know the HIV status of your partner(s). ? You take drugs by injection. ? You are sexually active with a partner who has HIV.  Talk with your health care provider about whether you are at high risk of being infected with HIV. If you choose to begin PrEP, you should first be tested for HIV. You should then be tested every 3 months for as long as you are taking PrEP. Pregnancy  If you are premenopausal and you may become pregnant, ask your health care provider about preconception counseling.  If you may become pregnant, take 400 to 800 micrograms (mcg) of folic acid every day.  If you want to prevent pregnancy, talk to your health care provider about birth control (contraception). Osteoporosis and menopause  Osteoporosis is a disease in which the bones lose minerals and strength with aging. This can result in serious bone fractures. Your risk for osteoporosis can be identified using a bone density scan.  If you are 65 years of age or older, or if you are at risk for osteoporosis and fractures, ask your health care provider if you should be screened.  Ask your health care provider whether you should take a calcium or vitamin D supplement to lower your risk for osteoporosis.  Menopause may have certain physical symptoms and risks.  Hormone replacement therapy may reduce some of these symptoms and risks. Talk to your health  care provider about whether hormone replacement therapy is right for you. Follow these instructions at home:  Schedule regular health, dental, and eye exams.  Stay current with your immunizations.  Do not use any tobacco products including cigarettes, chewing tobacco, or electronic cigarettes.  If you are pregnant, do not drink alcohol.  If you are breastfeeding, limit how much and how often you drink alcohol.  Limit alcohol intake to no more than 1 drink per day for nonpregnant women. One drink equals 12 ounces of beer, 5 ounces of wine, or 1 ounces of hard liquor.  Do not use street drugs.  Do not share needles.  Ask your health care provider for help if you need support or information about quitting drugs.  Tell your health care provider if you often feel depressed.  Tell your health care provider if you have ever been abused or do not feel safe at home. This information is not intended to replace advice given to you by your health care provider. Make sure you discuss any questions you have with your health care provider. Document Released: 04/30/2011 Document Revised: 03/22/2016 Document Reviewed: 07/19/2015 Elsevier Interactive Patient Education  2018 [employer] Inc[employer]  

## 2017-07-21 NOTE — Progress Notes (Signed)
Subjective:    Patient ID: Angel Williamson, female    DOB: 15-Jun-1956, 61 y.o.   MRN: [id number]  HPI She is here for a physical exam.   When ever she eats shrimp she gets red hives/ rash around her mouth and neck.  It does itch.  She had an episode in June.  She will take benadryl and it helps.  She denies edema/swelling.    She tries to walk, but does not walk regularly.   Medications and allergies reviewed with patient and updated if appropriate.  Patient Active Problem List   Diagnosis Date Noted  . OSA (obstructive sleep apnea), Severe 08/22/2016  . Prediabetes 01/11/2016  . Obesity 01/11/2016  . Skin cancer, basal cell 04/18/2012  . COLONIC POLYPS, HX OF 12/13/2009  . OSTEOPENIA 03/29/2009  . Essential hypertension 11/14/2007    Current Outpatient Prescriptions on File Prior to Visit  Medication Sig Dispense Refill  . Calcium Carb-Cholecalciferol (CALCIUM 1000 + D PO) Take by mouth daily.    . cholecalciferol (VITAMIN D) 1000 UNITS tablet Take 1,000 Units by mouth daily.    . fluticasone (FLONASE) 50 MCG/ACT nasal spray USE 1 SPRAY IN EACH NOSTRIL TWICE A DAY AS NEEDED 16 g 5  . losartan (COZAAR) 100 MG tablet Take 1 tablet (100 mg total) by mouth daily. 90 tablet 3  . spironolactone (ALDACTONE) 25 MG tablet TAKE 1 TABLET BY MOUTH DAILY 90 tablet 3   No current facility-administered medications on file prior to visit.     Past Medical History:  Diagnosis Date  . Breast fibrocystic disorder   . Calcaneus fracture 2010   no surgery  . History of colonic polyps    adenomatous  . Hypertension   . Obesity   . OSA (obstructive sleep apnea)   . Osteopenia     Past Surgical History:  Procedure Laterality Date  . BREAST CYST ASPIRATION    . COLONOSCOPY  2014   negative  . COLONOSCOPY W/ POLYPECTOMY     adenomatous polyps 2003 & 2007 Due 2012. Dr.Kaplan   . G 2 P 2    . no prior surgery      Social History   Social History  . Marital status: Married   Spouse name: N/A  . Number of children: N/A  . Years of education: N/A   Occupational History  . office assistant    Social History Main Topics  . Smoking status: Former Smoker    Packs/day: 1.00    Years: 12.00    Types: Cigarettes    Quit date: 10/29/1986  . Smokeless tobacco: Never Used  . Alcohol use No  . Drug use: No  . Sexual activity: Not Asked   Other Topics Concern  . None   Social History Narrative  . None    Family History  Problem Relation Age of Onset  . Cancer Paternal Grandmother        stomach  . Stomach cancer Paternal Grandmother   . Diabetes Maternal Grandfather   . Stroke Paternal Grandfather 5  . Colon polyps Father   . Anemia Mother   . Anemia Other        Cousin   . Colon cancer Neg Hx   . Esophageal cancer Neg Hx   . Rectal cancer Neg Hx     Review of Systems  Constitutional: Negative for chills and fever.  Eyes: Negative for visual disturbance.  Respiratory: Negative for cough, shortness of breath and wheezing.  Cardiovascular: Positive for palpitations (with exertion). Negative for chest pain and leg swelling.  Gastrointestinal: Negative for abdominal pain, blood in stool, constipation, diarrhea and nausea.       No gerd  Genitourinary: Negative for dysuria and hematuria.  Musculoskeletal: Negative for arthralgias and back pain.  Skin: Negative for color change and rash.  Neurological: Negative for dizziness, light-headedness and headaches.  Psychiatric/Behavioral: Negative for dysphoric mood. The patient is not nervous/anxious.        Objective:   Vitals:   07/22/17 1448  BP: (!) 142/88  Pulse: 68  Temp: 98.5 F (36.9 C)  SpO2: 98%   Filed Weights   07/22/17 1448  Weight: 192 lb (87.1 kg)   Body mass index is 35.12 kg/m.  Wt Readings from Last 3 Encounters:  07/22/17 192 lb (87.1 kg)  08/28/16 187 lb (84.8 kg)  08/22/16 189 lb (85.7 kg)     Physical Exam Constitutional: She appears well-developed and  well-nourished. No distress.  HENT:  Head: Normocephalic and atraumatic.  Right Ear: External ear normal. Normal ear canal and TM Left Ear: External ear normal.  Normal ear canal and TM Mouth/Throat: Oropharynx is clear and moist.  Eyes: Conjunctivae and EOM are normal.  Neck: Neck supple. No tracheal deviation present. No thyromegaly present.  No carotid bruit  Cardiovascular: Normal rate, regular rhythm and normal heart sounds.   No murmur heard.  No edema. Pulmonary/Chest: Effort normal and breath sounds normal. No respiratory distress. She has no wheezes. She has no rales.  Breast: deferred to Gyn Abdominal: Soft. She exhibits no distension. There is no tenderness.  Lymphadenopathy: She has no cervical adenopathy.  Skin: Skin is warm and dry. She is not diaphoretic.  Psychiatric: She has a normal mood and affect. Her behavior is normal.         Assessment & Plan:   Physical exam: Screening blood work  ordered Immunizations  Tdap, flu and shingrix discussed.   Colonoscopy  Up to date  Mammogram   Up to date  Gyn  Up to date  Dexa  Managed by gyn - osteopenia Eye exams   Up to date  EKG  Last done 2015 Exercise stressed regular exercise  Weight  Advised weight loss Skin  Few age spots, no other concerns Substance abuse   none  See Problem List for Assessment and Plan of chronic medical problems.   FU in 6 months

## 2017-07-22 ENCOUNTER — Encounter: Payer: Self-pay | Admitting: Internal Medicine

## 2017-07-22 ENCOUNTER — Ambulatory Visit (INDEPENDENT_AMBULATORY_CARE_PROVIDER_SITE_OTHER): Payer: 59 | Admitting: Internal Medicine

## 2017-07-22 VITALS — BP 142/88 | HR 68 | Temp 98.5°F | Ht 62.0 in | Wt 192.0 lb

## 2017-07-22 DIAGNOSIS — R7303 Prediabetes: Secondary | ICD-10-CM

## 2017-07-22 DIAGNOSIS — F419 Anxiety disorder, unspecified: Secondary | ICD-10-CM | POA: Insufficient documentation

## 2017-07-22 DIAGNOSIS — G4733 Obstructive sleep apnea (adult) (pediatric): Secondary | ICD-10-CM

## 2017-07-22 DIAGNOSIS — I10 Essential (primary) hypertension: Secondary | ICD-10-CM

## 2017-07-22 DIAGNOSIS — Z0000 Encounter for general adult medical examination without abnormal findings: Secondary | ICD-10-CM

## 2017-07-22 MED ORDER — LORAZEPAM 0.5 MG PO TABS: 0.5000 mg | ORAL_TABLET | Freq: Two times a day (BID) | ORAL | 0 refills | Status: DC | PRN

## 2017-07-22 NOTE — Assessment & Plan Note (Signed)
BP slightly elevated today  BP Readings from Last 3 Encounters:  07/22/17 (!) 142/88  08/28/16 122/60  08/22/16 132/84   Has been well controlled - start checking at home Increase exercise, weight loss, low sodium diet cmp

## 2017-07-22 NOTE — Assessment & Plan Note (Signed)
Using cpap nightly 

## 2017-07-22 NOTE — Assessment & Plan Note (Signed)
Check a1c Low sugar / carb diet Stressed regular exercise, weight loss  

## 2017-07-22 NOTE — Assessment & Plan Note (Signed)
Intermittent - uses 1/2 lorazepam as needed only - which is rare Ok to continue - refilled today Discussed concerns with medications, possible side effects

## 2017-07-25 ENCOUNTER — Other Ambulatory Visit (INDEPENDENT_AMBULATORY_CARE_PROVIDER_SITE_OTHER): Payer: 59

## 2017-07-25 DIAGNOSIS — Z0000 Encounter for general adult medical examination without abnormal findings: Secondary | ICD-10-CM

## 2017-07-25 DIAGNOSIS — I10 Essential (primary) hypertension: Secondary | ICD-10-CM

## 2017-07-25 DIAGNOSIS — R7303 Prediabetes: Secondary | ICD-10-CM | POA: Diagnosis not present

## 2017-07-25 DIAGNOSIS — G4733 Obstructive sleep apnea (adult) (pediatric): Secondary | ICD-10-CM | POA: Diagnosis not present

## 2017-07-25 LAB — LIPID PANEL
Cholesterol: 127 mg/dL (ref 0–200)
HDL: 37.8 mg/dL — AB (ref >39.00)
LDL Cholesterol: 69 mg/dL (ref 0–99)
NonHDL: 89.22
TRIGLYCERIDES: 101 mg/dL (ref 0.0–149.0)
Total CHOL/HDL Ratio: 3
VLDL: 20.2 mg/dL (ref 0.0–40.0)

## 2017-07-25 LAB — CBC WITH DIFFERENTIAL/PLATELET
BASOS ABS: 0.1 10*3/uL (ref 0.0–0.1)
Basophils Relative: 1 % (ref 0.0–3.0)
EOS ABS: 0.3 10*3/uL (ref 0.0–0.7)
Eosinophils Relative: 4 % (ref 0.0–5.0)
HCT: 35.1 % — ABNORMAL LOW (ref 36.0–46.0)
HEMOGLOBIN: 11.7 g/dL — AB (ref 12.0–15.0)
Lymphocytes Relative: 34.5 % (ref 12.0–46.0)
Lymphs Abs: 2.3 10*3/uL (ref 0.7–4.0)
MCHC: 33.3 g/dL (ref 30.0–36.0)
MCV: 91.6 fl (ref 78.0–100.0)
MONO ABS: 0.5 10*3/uL (ref 0.1–1.0)
Monocytes Relative: 7.6 % (ref 3.0–12.0)
NEUTROS PCT: 52.9 % (ref 43.0–77.0)
Neutro Abs: 3.5 10*3/uL (ref 1.4–7.7)
Platelets: 274 10*3/uL (ref 150.0–400.0)
RBC: 3.84 Mil/uL — AB (ref 3.87–5.11)
RDW: 13.1 % (ref 11.5–15.5)
WBC: 6.7 10*3/uL (ref 4.0–10.5)

## 2017-07-25 LAB — TSH: TSH: 1.53 u[IU]/mL (ref 0.35–4.50)

## 2017-07-25 LAB — COMPREHENSIVE METABOLIC PANEL
ALBUMIN: 4.4 g/dL (ref 3.5–5.2)
ALK PHOS: 76 U/L (ref 39–117)
ALT: 13 U/L (ref 0–35)
AST: 12 U/L (ref 0–37)
BILIRUBIN TOTAL: 0.7 mg/dL (ref 0.2–1.2)
BUN: 14 mg/dL (ref 6–23)
CO2: 26 mEq/L (ref 19–32)
Calcium: 9.7 mg/dL (ref 8.4–10.5)
Chloride: 102 mEq/L (ref 96–112)
Creatinine, Ser: 1.05 mg/dL (ref 0.40–1.20)
GFR: 56.54 mL/min — AB (ref >60.00)
Glucose, Bld: 114 mg/dL — ABNORMAL HIGH (ref 70–99)
POTASSIUM: 4.1 meq/L (ref 3.5–5.1)
Sodium: 138 mEq/L (ref 135–145)
TOTAL PROTEIN: 7.5 g/dL (ref 6.0–8.3)

## 2017-07-25 LAB — HEMOGLOBIN A1C: HEMOGLOBIN A1C: 6.1 % (ref 4.6–6.5)

## 2017-08-07 ENCOUNTER — Encounter: Payer: Self-pay | Admitting: Internal Medicine

## 2017-08-07 ENCOUNTER — Ambulatory Visit (INDEPENDENT_AMBULATORY_CARE_PROVIDER_SITE_OTHER): Payer: 59 | Admitting: Internal Medicine

## 2017-08-07 DIAGNOSIS — I10 Essential (primary) hypertension: Secondary | ICD-10-CM

## 2017-08-07 DIAGNOSIS — H539 Unspecified visual disturbance: Secondary | ICD-10-CM

## 2017-08-07 DIAGNOSIS — H43811 Vitreous degeneration, right eye: Secondary | ICD-10-CM | POA: Diagnosis not present

## 2017-08-07 NOTE — Assessment & Plan Note (Signed)
We'll continue losartan 50 mg twice daily and spironolactone 25 mg daily Continue to monitor blood pressure at home

## 2017-08-07 NOTE — Patient Instructions (Addendum)
  Medications reviewed and updated.  No changes recommended at this time.  Follow up with your eye doctor.

## 2017-08-07 NOTE — Progress Notes (Signed)
Subjective:    Patient ID: Angel Williamson, female    DOB: 1956/01/13, 61 y.o.   MRN: [id number]  HPI She is here for an acute visit.   She was worried about her blood pressure being too low and just started checking it 2 days ago. 2 days ago her blood pressure was 110/60 in the morning. She was concerned about taking a whole losartan and if it would drop the blood pressure too low so she only took half. She also took her spironolactone. At 9:00 in the evening her blood pressure was 124/77 and she took the other half of losartan. She thinks when she took a whole pill in the morning and made her lightheaded, but she is also noticed a change in her vision and was not sure if it was related to the medication or her vision. She first noticed changes in her vision and last week. She has had a couple of flash as in her right eye and has what looks like a spider web in the eye.   Medications and allergies reviewed with patient and updated if appropriate.  Patient Active Problem List   Diagnosis Date Noted  . Anxiety 07/22/2017  . OSA (obstructive sleep apnea), Severe 08/22/2016  . Prediabetes 01/11/2016  . Obesity 01/11/2016  . Skin cancer, basal cell 04/18/2012  . COLONIC POLYPS, HX OF 12/13/2009  . OSTEOPENIA 03/29/2009  . Essential hypertension 11/14/2007    Current Outpatient Prescriptions on File Prior to Visit  Medication Sig Dispense Refill  . Calcium Carb-Cholecalciferol (CALCIUM 1000 + D PO) Take by mouth daily.    . cholecalciferol (VITAMIN D) 1000 UNITS tablet Take 1,000 Units by mouth daily.    . fluticasone (FLONASE) 50 MCG/ACT nasal spray USE 1 SPRAY IN EACH NOSTRIL TWICE A DAY AS NEEDED 16 g 5  . LORazepam (ATIVAN) 0.5 MG tablet Take 1 tablet (0.5 mg total) by mouth 2 (two) times daily as needed for anxiety. 20 tablet 0  . losartan (COZAAR) 100 MG tablet Take 1 tablet (100 mg total) by mouth daily. 90 tablet 3  . spironolactone (ALDACTONE) 25 MG tablet TAKE 1 TABLET BY MOUTH  DAILY 90 tablet 3   No current facility-administered medications on file prior to visit.     Past Medical History:  Diagnosis Date  . Breast fibrocystic disorder   . Calcaneus fracture 2010   no surgery  . History of colonic polyps    adenomatous  . Hypertension   . Obesity   . OSA (obstructive sleep apnea)   . Osteopenia     Past Surgical History:  Procedure Laterality Date  . BREAST CYST ASPIRATION    . COLONOSCOPY  2014   negative  . COLONOSCOPY W/ POLYPECTOMY     adenomatous polyps 2003 & 2007 Due 2012. Dr.Kaplan   . G 2 P 2    . no prior surgery      Social History   Social History  . Marital status: Married    Spouse name: N/A  . Number of children: N/A  . Years of education: N/A   Occupational History  . office assistant    Social History Main Topics  . Smoking status: Former Smoker    Packs/day: 1.00    Years: 12.00    Types: Cigarettes    Quit date: 10/29/1986  . Smokeless tobacco: Never Used  . Alcohol use No  . Drug use: No  . Sexual activity: Not Asked   Other Topics  Concern  . None   Social History Narrative  . None    Family History  Problem Relation Age of Onset  . Cancer Paternal Grandmother        stomach  . Stomach cancer Paternal Grandmother   . Diabetes Maternal Grandfather   . Stroke Paternal Grandfather 58  . Colon polyps Father   . Anemia Mother   . Anemia Other        Cousin   . Colon cancer Neg Hx   . Esophageal cancer Neg Hx   . Rectal cancer Neg Hx     Review of Systems  Constitutional: Negative for fever.  Eyes: Positive for visual disturbance.  Respiratory: Negative for shortness of breath.   Cardiovascular: Negative for chest pain, palpitations and leg swelling.  Neurological: Negative for light-headedness and headaches.       Objective:   Vitals:   08/07/17 1145  BP: (!) 144/82  Pulse: 86  Resp: 16  Temp: 98.6 F (37 C)  SpO2: 96%   Filed Weights   08/07/17 1145  Weight: 189 lb (85.7 kg)    Body mass index is 34.57 kg/m.  Wt Readings from Last 3 Encounters:  08/07/17 189 lb (85.7 kg)  07/22/17 192 lb (87.1 kg)  08/28/16 187 lb (84.8 kg)     Physical Exam Constitutional: Appears well-developed and well-nourished. No distress.  HENT:  Head: Normocephalic and atraumatic.  Neck: Neck supple. No tracheal deviation present. No thyromegaly present.  No cervical lymphadenopathy Cardiovascular: Normal rate, regular rhythm and normal heart sounds.   No murmur heard. No carotid bruit .  No edema Pulmonary/Chest: Effort normal and breath sounds normal. No respiratory distress. No has no wheezes. No rales.  Skin: Skin is warm and dry. Not diaphoretic.  Psychiatric: Normal mood and affect. Behavior is normal.         Assessment & Plan:   See Problem List for Assessment and Plan of chronic medical problems.

## 2017-08-07 NOTE — Assessment & Plan Note (Signed)
Has had some flashes and a spider web or floater in her right eye concern for possible retinal tear Unlikely related to blood pressure or blood pressure medications Advised to call ophthalmology today to schedule an appointment

## 2017-08-08 ENCOUNTER — Ambulatory Visit: Payer: 59 | Admitting: Pulmonary Disease

## 2017-08-13 ENCOUNTER — Other Ambulatory Visit: Payer: Self-pay | Admitting: Internal Medicine

## 2017-08-13 DIAGNOSIS — I10 Essential (primary) hypertension: Secondary | ICD-10-CM

## 2017-08-16 ENCOUNTER — Ambulatory Visit: Payer: 59 | Admitting: Pulmonary Disease

## 2017-08-24 DIAGNOSIS — G4733 Obstructive sleep apnea (adult) (pediatric): Secondary | ICD-10-CM | POA: Diagnosis not present

## 2017-09-02 DIAGNOSIS — H2513 Age-related nuclear cataract, bilateral: Secondary | ICD-10-CM | POA: Diagnosis not present

## 2017-09-02 DIAGNOSIS — H35033 Hypertensive retinopathy, bilateral: Secondary | ICD-10-CM | POA: Diagnosis not present

## 2017-09-02 DIAGNOSIS — H43811 Vitreous degeneration, right eye: Secondary | ICD-10-CM | POA: Diagnosis not present

## 2017-09-17 DIAGNOSIS — Z130 Encounter for screening for diseases of the blood and blood-forming organs and certain disorders involving the immune mechanism: Secondary | ICD-10-CM | POA: Diagnosis not present

## 2017-09-17 DIAGNOSIS — Z01419 Encounter for gynecological examination (general) (routine) without abnormal findings: Secondary | ICD-10-CM | POA: Diagnosis not present

## 2017-09-17 DIAGNOSIS — Z1389 Encounter for screening for other disorder: Secondary | ICD-10-CM | POA: Diagnosis not present

## 2017-09-17 DIAGNOSIS — Z1231 Encounter for screening mammogram for malignant neoplasm of breast: Secondary | ICD-10-CM | POA: Diagnosis not present

## 2017-09-23 ENCOUNTER — Encounter: Payer: Self-pay | Admitting: Pulmonary Disease

## 2017-09-23 ENCOUNTER — Ambulatory Visit: Payer: 59 | Admitting: Pulmonary Disease

## 2017-09-23 VITALS — BP 116/78 | HR 61 | Ht 62.0 in | Wt 193.8 lb

## 2017-09-23 DIAGNOSIS — G4733 Obstructive sleep apnea (adult) (pediatric): Secondary | ICD-10-CM | POA: Diagnosis not present

## 2017-09-23 NOTE — Patient Instructions (Signed)
Follow up in 1 year.

## 2017-09-23 NOTE — Progress Notes (Signed)
Current Outpatient Medications on File Prior to Visit  Medication Sig  . Calcium Carb-Cholecalciferol (CALCIUM 1000 + D PO) Take by mouth daily.  . cholecalciferol (VITAMIN D) 1000 UNITS tablet Take 1,000 Units by mouth daily.  . fluticasone (FLONASE) 50 MCG/ACT nasal spray USE 1 SPRAY IN EACH NOSTRIL TWICE A DAY AS NEEDED  . LORazepam (ATIVAN) 0.5 MG tablet Take 1 tablet (0.5 mg total) by mouth 2 (two) times daily as needed for anxiety.  Marland Kitchen losartan (COZAAR) 100 MG tablet Take 1 tablet (100 mg total) by mouth daily.  Marland Kitchen spironolactone (ALDACTONE) 25 MG tablet TAKE 1 TABLET BY MOUTH DAILY   No current facility-administered medications on file prior to visit.      Chief Complaint  Patient presents with  . Follow-up    Pt is doing well overall.     Sleep tests HST 06/04/16 >> AHI 46.8, SaO2 low 72% Auto CPAP 08/21/17 to 09/19/17 >> used on 30 of 30 nights with average 5 hrs 40 min.  Average AHI 0.4 with median CPAP 7 and 95 th percentile CPAP 10 cm H2O  Past medical history HTN  Past surgical history, Family history, Social history, Allergies all reviewed.  Vital Signs BP 116/78 (BP Location: Left Arm, Cuff Size: Normal)   Pulse 61   Ht 5\' 2"  (1.575 m)   Wt 193 lb 12.8 oz (87.9 kg)   SpO2 100%   BMI 35.45 kg/m   History of Present Illness Angel Williamson is a 61 y.o. female with obstructive sleep apnea.  She is doing well with CPAP.  Uses nasal mask.  No issues with mask fit.  Denies nasal or mouth dryness.  Feels rested.  Physical Exam  General - pleasant Eyes - pupils reactive ENT - no sinus tenderness, no oral exudate, no LAN, MP 3 Cardiac - regular, no murmur Chest - no wheeze, rales Abd - soft, non tender Ext - no edema Skin - no rashes Neuro - normal strength Psych - normal mood  Assessment/Plan  Obstructive sleep apnea. - she is compliant with CPAP and reports benefit from therapy - continue auto CPAP - encouraged her to use CPAP for entire time she is  asleep   Patient Instructions  Follow up in 1 year    Chesley Mires, MD [place] Pager:  [phone] 09/23/2017, 2:58 PM

## 2017-10-01 DIAGNOSIS — G4733 Obstructive sleep apnea (adult) (pediatric): Secondary | ICD-10-CM | POA: Diagnosis not present

## 2017-10-31 DIAGNOSIS — G4733 Obstructive sleep apnea (adult) (pediatric): Secondary | ICD-10-CM | POA: Diagnosis not present

## 2017-10-31 NOTE — Progress Notes (Signed)
Subjective:    Patient ID: Angel Williamson, female    DOB: Feb 26, 1956, 62 y.o.   MRN: [id number]  HPI She is here for an acute visit for cold symptoms.  Her symptoms started 6 days ago ago.    She is experiencing  She has taken mucinex and tylenol  Medications and allergies reviewed with patient and updated if appropriate.  Patient Active Problem List   Diagnosis Date Noted  . Change in vision 08/07/2017  . Anxiety 07/22/2017  . OSA (obstructive sleep apnea), Severe 08/22/2016  . Prediabetes 01/11/2016  . Obesity 01/11/2016  . Skin cancer, basal cell 04/18/2012  . COLONIC POLYPS, HX OF 12/13/2009  . OSTEOPENIA 03/29/2009  . Essential hypertension 11/14/2007    Current Outpatient Medications on File Prior to Visit  Medication Sig Dispense Refill  . Calcium Carb-Cholecalciferol (CALCIUM 1000 + D PO) Take by mouth daily.    . cholecalciferol (VITAMIN D) 1000 UNITS tablet Take 1,000 Units by mouth daily.    . fluticasone (FLONASE) 50 MCG/ACT nasal spray USE 1 SPRAY IN EACH NOSTRIL TWICE A DAY AS NEEDED 16 g 5  . LORazepam (ATIVAN) 0.5 MG tablet Take 1 tablet (0.5 mg total) by mouth 2 (two) times daily as needed for anxiety. 20 tablet 0  . losartan (COZAAR) 100 MG tablet Take 1 tablet (100 mg total) by mouth daily. 90 tablet 3  . spironolactone (ALDACTONE) 25 MG tablet TAKE 1 TABLET BY MOUTH DAILY 90 tablet 3   No current facility-administered medications on file prior to visit.     Past Medical History:  Diagnosis Date  . Breast fibrocystic disorder   . Calcaneus fracture 2010   no surgery  . History of colonic polyps    adenomatous  . Hypertension   . Obesity   . OSA (obstructive sleep apnea)   . Osteopenia     Past Surgical History:  Procedure Laterality Date  . BREAST CYST ASPIRATION    . COLONOSCOPY  2014   negative  . COLONOSCOPY W/ POLYPECTOMY     adenomatous polyps 2003 & 2007 Due 2012. Dr.Kaplan   . G 2 P 2    . no prior surgery      Social  History   Socioeconomic History  . Marital status: Married    Spouse name: None  . Number of children: None  . Years of education: None  . Highest education level: None  Social Needs  . Financial resource strain: None  . Food insecurity - worry: None  . Food insecurity - inability: None  . Transportation needs - medical: None  . Transportation needs - non-medical: None  Occupational History  . Occupation: Surveyor, minerals  Tobacco Use  . Smoking status: Former Smoker    Packs/day: 1.00    Years: 12.00    Pack years: 12.00    Types: Cigarettes    Last attempt to quit: 10/29/1986    Years since quitting: 31.0  . Smokeless tobacco: Never Used  Substance and Sexual Activity  . Alcohol use: No  . Drug use: No  . Sexual activity: None  Other Topics Concern  . None  Social History Narrative  . None    Family History  Problem Relation Age of Onset  . Cancer Paternal Grandmother        stomach  . Stomach cancer Paternal Grandmother   . Diabetes Maternal Grandfather   . Stroke Paternal Grandfather 20  . Colon polyps Father   . Anemia  Mother   . Anemia Other        Cousin   . Colon cancer Neg Hx   . Esophageal cancer Neg Hx   . Rectal cancer Neg Hx     Review of Systems  Constitutional: Negative for chills and fever.  HENT: Positive for congestion, postnasal drip, sinus pressure and sinus pain. Negative for ear pain (ear pressure) and sore throat.   Respiratory: Positive for cough (productive, thick, green). Negative for chest tightness, shortness of breath and wheezing.   Cardiovascular: Negative for chest pain.  Gastrointestinal: Negative for diarrhea and nausea.  Musculoskeletal: Negative for myalgias.  Neurological: Positive for headaches. Negative for light-headedness.       Objective:   Vitals:   11/01/17 1345  BP: 118/82  Pulse: 62  Resp: 16  Temp: 98.2 F (36.8 C)  SpO2: 95%   Filed Weights   11/01/17 1345  Weight: 193 lb (87.5 kg)   Body mass  index is 35.3 kg/m.  Wt Readings from Last 3 Encounters:  11/01/17 193 lb (87.5 kg)  09/23/17 193 lb 12.8 oz (87.9 kg)  08/07/17 189 lb (85.7 kg)     Physical Exam GENERAL APPEARANCE: Appears stated age, well appearing, NAD EYES: conjunctiva clear, no icterus HEENT: bilateral tympanic membranes and ear canals normal, oropharynx with no erythema, nasal congestion, no thyromegaly, trachea midline, no cervical or supraclavicular lymphadenopathy LUNGS: Clear to auscultation without wheeze or crackles, unlabored breathing, good air entry bilaterally CARDIOVASCULAR: Normal S1,S2 without murmurs, no edema SKIN: warm, dry        Assessment & Plan:   See Problem List for Assessment and Plan of chronic medical problems.

## 2017-11-01 ENCOUNTER — Ambulatory Visit: Payer: 59 | Admitting: Internal Medicine

## 2017-11-01 ENCOUNTER — Encounter: Payer: Self-pay | Admitting: Internal Medicine

## 2017-11-01 VITALS — BP 118/82 | HR 62 | Temp 98.2°F | Resp 16 | Wt 193.0 lb

## 2017-11-01 DIAGNOSIS — J0100 Acute maxillary sinusitis, unspecified: Secondary | ICD-10-CM | POA: Diagnosis not present

## 2017-11-01 MED ORDER — AMOXICILLIN-POT CLAVULANATE 875-125 MG PO TABS: 1.0000 | ORAL_TABLET | Freq: Two times a day (BID) | ORAL | 0 refills | Status: DC

## 2017-11-01 MED ORDER — HYDROCOD POLST-CPM POLST ER 10-8 MG/5ML PO SUER: 5.0000 mL | Freq: Two times a day (BID) | ORAL | 0 refills | Status: DC | PRN

## 2017-11-01 NOTE — Patient Instructions (Signed)
An antibiotic and cough syrup was sent to your pharmacy.   Your prescription(s) have been submitted to your pharmacy or been printed and provided for you. Please take as directed and contact our office if you believe you are having problem(s) with the medication(s) or have any questions.  If your symptoms worsen or fail to improve, please contact our office for further instruction, or in case of emergency go directly to the emergency room at the closest medical facility.   General Recommendations:    Please drink plenty of fluids.  Get plenty of rest   Sleep in humidified air  Use saline nasal sprays  Netti pot  OTC Medications:  Decongestants - helps relieve congestion   Flonase (generic fluticasone) or Nasacort (generic triamcinolone) - please make sure to use the "cross-over" technique at a 45 degree angle towards the opposite eye as opposed to straight up the nasal passageway.   Sudafed (generic pseudoephedrine - Note this is the one that is available behind the pharmacy counter); Products with phenylephrine (-PE) may also be used but is often not as effective as pseudoephedrine.   If you have HIGH BLOOD PRESSURE - Coricidin HBP; AVOID any product that is -D as this contains pseudoephedrine which may increase your blood pressure.  Afrin (oxymetazoline) every 6-8 hours for up to 3 days.  Allergies - helps relieve runny nose, itchy eyes and sneezing   Claritin (generic loratidine), Allegra (fexofenidine), or Zyrtec (generic cyrterizine) for runny nose. These medications should not cause drowsiness.  Note - Benadryl (generic diphenhydramine) may be used however may cause drowsiness  Cough -   Delsym or Robitussin (generic dextromethorphan)  Expectorants - helps loosen mucus to ease removal   Mucinex (generic guaifenesin) as directed on the package.  Headaches / General Aches   Tylenol (generic acetaminophen) - DO NOT EXCEED 3 grams (3,000 mg) in a  24 hour time period  Advil/Motrin (generic ibuprofen)  Sore Throat -   Salt water gargle   Chloraseptic (generic benzocaine) spray or lozenges / Sucrets (generic dyclonine)  

## 2017-11-01 NOTE — Assessment & Plan Note (Signed)
Likely bacterial  Start augmentin Prescription cough syrup otc cold medications Rest, fluid Call if no improvement  

## 2017-12-23 DIAGNOSIS — G4733 Obstructive sleep apnea (adult) (pediatric): Secondary | ICD-10-CM | POA: Diagnosis not present

## 2018-01-20 NOTE — Progress Notes (Signed)
Subjective:    Patient ID: Angel Williamson, female    DOB: 07/26/1956, 62 y.o.   MRN: [id number]  HPI The patient is here for follow up.  Hypertension: She is taking her medication daily. She is compliant with a low sodium diet.  She denies chest pain, palpitations, edema, shortness of breath and regular headaches. She is exercising regularly - walking.      Anxiety: She is taking the ativan only as needed which is not often. She denies any side effects from the medication. She feels her anxiety is well controlled and she is happy with her current dose of medication.   Prediabetes:  She is compliant with a low sugar/carbohydrate diet.  She is exercising regularly - walking.  She knows she should lose weight.  OSA:  She is using her cpap nightly.  She does feel her lung capacity is better since using the CPAP.  Medications and allergies reviewed with patient and updated if appropriate.  Patient Active Problem List   Diagnosis Date Noted  . Change in vision 08/07/2017  . Anxiety 07/22/2017  . OSA (obstructive sleep apnea), Severe 08/22/2016  . Prediabetes 01/11/2016  . Obesity 01/11/2016  . Skin cancer, basal cell 04/18/2012  . COLONIC POLYPS, HX OF 12/13/2009  . Osteopenia 03/29/2009  . Essential hypertension 11/14/2007    Current Outpatient Medications on File Prior to Visit  Medication Sig Dispense Refill  . Calcium Carb-Cholecalciferol (CALCIUM 1000 + D PO) Take by mouth daily.    . cholecalciferol (VITAMIN D) 1000 UNITS tablet Take 1,000 Units by mouth daily.    . fluticasone (FLONASE) 50 MCG/ACT nasal spray USE 1 SPRAY IN EACH NOSTRIL TWICE A DAY AS NEEDED 16 g 5  . LORazepam (ATIVAN) 0.5 MG tablet Take 1 tablet (0.5 mg total) by mouth 2 (two) times daily as needed for anxiety. 20 tablet 0  . losartan (COZAAR) 100 MG tablet Take 1 tablet (100 mg total) by mouth daily. 90 tablet 3  . spironolactone (ALDACTONE) 25 MG tablet TAKE 1 TABLET BY MOUTH DAILY 90 tablet 3   No  current facility-administered medications on file prior to visit.     Past Medical History:  Diagnosis Date  . Breast fibrocystic disorder   . Calcaneus fracture 2010   no surgery  . History of colonic polyps    adenomatous  . Hypertension   . Obesity   . OSA (obstructive sleep apnea)   . Osteopenia     Past Surgical History:  Procedure Laterality Date  . BREAST CYST ASPIRATION    . COLONOSCOPY  2014   negative  . COLONOSCOPY W/ POLYPECTOMY     adenomatous polyps 2003 & 2007 Due 2012. Dr.Kaplan   . G 2 P 2    . no prior surgery      Social History   Socioeconomic History  . Marital status: Married    Spouse name: Not on file  . Number of children: Not on file  . Years of education: Not on file  . Highest education level: Not on file  Occupational History  . Occupation: Surveyor, minerals  Social Needs  . Financial resource strain: Not on file  . Food insecurity:    Worry: Not on file    Inability: Not on file  . Transportation needs:    Medical: Not on file    Non-medical: Not on file  Tobacco Use  . Smoking status: Former Smoker    Packs/day: 1.00  Years: 12.00    Pack years: 12.00    Types: Cigarettes    Last attempt to quit: 10/29/1986    Years since quitting: 31.2  . Smokeless tobacco: Never Used  Substance and Sexual Activity  . Alcohol use: No  . Drug use: No  . Sexual activity: Not on file  Lifestyle  . Physical activity:    Days per week: Not on file    Minutes per session: Not on file  . Stress: Not on file  Relationships  . Social connections:    Talks on phone: Not on file    Gets together: Not on file    Attends religious service: Not on file    Active member of club or organization: Not on file    Attends meetings of clubs or organizations: Not on file    Relationship status: Not on file  Other Topics Concern  . Not on file  Social History Narrative  . Not on file    Family History  Problem Relation Age of Onset  . Cancer  Paternal Grandmother        stomach  . Stomach cancer Paternal Grandmother   . Diabetes Maternal Grandfather   . Stroke Paternal Grandfather 63  . Colon polyps Father   . Anemia Mother   . Anemia Other        Cousin   . Colon cancer Neg Hx   . Esophageal cancer Neg Hx   . Rectal cancer Neg Hx     Review of Systems  Constitutional: Negative for chills and fever.  Respiratory: Negative for cough, shortness of breath and wheezing.   Cardiovascular: Negative for chest pain, palpitations and leg swelling.  Neurological: Negative for light-headedness and headaches.       Objective:   Vitals:   01/21/18 1538  BP: 124/82  Pulse: 69  Resp: 16  Temp: 98 F (36.7 C)  SpO2: 95%   BP Readings from Last 3 Encounters:  01/21/18 124/82  11/01/17 118/82  09/23/17 116/78   Wt Readings from Last 3 Encounters:  01/21/18 192 lb (87.1 kg)  11/01/17 193 lb (87.5 kg)  09/23/17 193 lb 12.8 oz (87.9 kg)   Body mass index is 35.12 kg/m.   Physical Exam    Constitutional: Appears well-developed and well-nourished. No distress.  HENT:  Head: Normocephalic and atraumatic.  Neck: Neck supple. No tracheal deviation present. No thyromegaly present.  No cervical lymphadenopathy Cardiovascular: Normal rate, regular rhythm and normal heart sounds.   No murmur heard. No carotid bruit .  No edema Pulmonary/Chest: Effort normal and breath sounds normal. No respiratory distress. No has no wheezes. No rales.  Skin: Skin is warm and dry. Not diaphoretic.  Psychiatric: Normal mood and affect. Behavior is normal.      Assessment & Plan:    See Problem List for Assessment and Plan of chronic medical problems.

## 2018-01-20 NOTE — Patient Instructions (Addendum)
  Test(s) ordered today. Your results will be released to MyChart (or called to you) after review, usually within 72hours after test completion. If any changes need to be made, you will be notified at that same time.  Medications reviewed and updated.  No changes recommended at this time.    Please followup in 6 months   

## 2018-01-21 ENCOUNTER — Encounter: Payer: Self-pay | Admitting: Internal Medicine

## 2018-01-21 ENCOUNTER — Ambulatory Visit: Payer: 59 | Admitting: Internal Medicine

## 2018-01-21 ENCOUNTER — Other Ambulatory Visit (INDEPENDENT_AMBULATORY_CARE_PROVIDER_SITE_OTHER): Payer: 59

## 2018-01-21 VITALS — BP 124/82 | HR 69 | Temp 98.0°F | Resp 16 | Wt 192.0 lb

## 2018-01-21 DIAGNOSIS — G4733 Obstructive sleep apnea (adult) (pediatric): Secondary | ICD-10-CM

## 2018-01-21 DIAGNOSIS — R7303 Prediabetes: Secondary | ICD-10-CM | POA: Diagnosis not present

## 2018-01-21 DIAGNOSIS — F419 Anxiety disorder, unspecified: Secondary | ICD-10-CM | POA: Diagnosis not present

## 2018-01-21 DIAGNOSIS — I10 Essential (primary) hypertension: Secondary | ICD-10-CM

## 2018-01-21 LAB — CBC WITH DIFFERENTIAL/PLATELET
BASOS ABS: 0.1 10*3/uL (ref 0.0–0.1)
Basophils Relative: 0.8 % (ref 0.0–3.0)
EOS ABS: 0.2 10*3/uL (ref 0.0–0.7)
EOS PCT: 3 % (ref 0.0–5.0)
HCT: 35.3 % — ABNORMAL LOW (ref 36.0–46.0)
HEMOGLOBIN: 11.9 g/dL — AB (ref 12.0–15.0)
Lymphocytes Relative: 30.8 % (ref 12.0–46.0)
Lymphs Abs: 2.3 10*3/uL (ref 0.7–4.0)
MCHC: 33.8 g/dL (ref 30.0–36.0)
MCV: 89.7 fl (ref 78.0–100.0)
Monocytes Absolute: 0.6 10*3/uL (ref 0.1–1.0)
Monocytes Relative: 7.8 % (ref 3.0–12.0)
NEUTROS ABS: 4.3 10*3/uL (ref 1.4–7.7)
Neutrophils Relative %: 57.6 % (ref 43.0–77.0)
Platelets: 294 10*3/uL (ref 150.0–400.0)
RBC: 3.94 Mil/uL (ref 3.87–5.11)
RDW: 13.1 % (ref 11.5–15.5)
WBC: 7.4 10*3/uL (ref 4.0–10.5)

## 2018-01-21 LAB — COMPREHENSIVE METABOLIC PANEL
ALBUMIN: 4.4 g/dL (ref 3.5–5.2)
ALT: 14 U/L (ref 0–35)
AST: 16 U/L (ref 0–37)
Alkaline Phosphatase: 79 U/L (ref 39–117)
BILIRUBIN TOTAL: 0.5 mg/dL (ref 0.2–1.2)
BUN: 20 mg/dL (ref 6–23)
CO2: 29 mEq/L (ref 19–32)
CREATININE: 1.03 mg/dL (ref 0.40–1.20)
Calcium: 9.6 mg/dL (ref 8.4–10.5)
Chloride: 102 mEq/L (ref 96–112)
GFR: 57.71 mL/min — AB (ref >60.00)
Glucose, Bld: 115 mg/dL — ABNORMAL HIGH (ref 70–99)
Potassium: 4.2 mEq/L (ref 3.5–5.1)
Sodium: 137 mEq/L (ref 135–145)
Total Protein: 7.9 g/dL (ref 6.0–8.3)

## 2018-01-21 LAB — HEMOGLOBIN A1C: HEMOGLOBIN A1C: 6.1 % (ref 4.6–6.5)

## 2018-01-21 NOTE — Assessment & Plan Note (Signed)
Compliant with CPAP Continue nightly use

## 2018-01-21 NOTE — Assessment & Plan Note (Signed)
She takes half of the lorazepam as needed only, which is not often We will continue

## 2018-01-21 NOTE — Assessment & Plan Note (Addendum)
Losartan - taking 50 mg daily.  Discussed the recall-she would like to check to see if her medication is on the recall list before changing medication-she will let me know If we need to switch can consider telmisartan Ideally monitor blood pressure at home Continue regular exercise Work on weight loss CMP, CBC today

## 2018-01-21 NOTE — Assessment & Plan Note (Signed)
Check a1c Low sugar / carb diet Stressed regular exercise, weight loss  

## 2018-01-22 DIAGNOSIS — G4733 Obstructive sleep apnea (adult) (pediatric): Secondary | ICD-10-CM | POA: Diagnosis not present

## 2018-02-21 DIAGNOSIS — G4733 Obstructive sleep apnea (adult) (pediatric): Secondary | ICD-10-CM | POA: Diagnosis not present

## 2018-03-17 DIAGNOSIS — D225 Melanocytic nevi of trunk: Secondary | ICD-10-CM | POA: Diagnosis not present

## 2018-03-17 DIAGNOSIS — D2262 Melanocytic nevi of left upper limb, including shoulder: Secondary | ICD-10-CM | POA: Diagnosis not present

## 2018-03-17 DIAGNOSIS — D2261 Melanocytic nevi of right upper limb, including shoulder: Secondary | ICD-10-CM | POA: Diagnosis not present

## 2018-03-19 DIAGNOSIS — M79671 Pain in right foot: Secondary | ICD-10-CM | POA: Diagnosis not present

## 2018-03-25 DIAGNOSIS — G4733 Obstructive sleep apnea (adult) (pediatric): Secondary | ICD-10-CM | POA: Diagnosis not present

## 2018-04-10 DIAGNOSIS — M79671 Pain in right foot: Secondary | ICD-10-CM | POA: Diagnosis not present

## 2018-04-24 DIAGNOSIS — G4733 Obstructive sleep apnea (adult) (pediatric): Secondary | ICD-10-CM | POA: Diagnosis not present

## 2018-04-30 DIAGNOSIS — M79671 Pain in right foot: Secondary | ICD-10-CM | POA: Diagnosis not present

## 2018-05-26 DIAGNOSIS — G4733 Obstructive sleep apnea (adult) (pediatric): Secondary | ICD-10-CM | POA: Diagnosis not present

## 2018-06-12 ENCOUNTER — Other Ambulatory Visit: Payer: Self-pay | Admitting: Internal Medicine

## 2018-06-25 DIAGNOSIS — G4733 Obstructive sleep apnea (adult) (pediatric): Secondary | ICD-10-CM | POA: Diagnosis not present

## 2018-07-25 ENCOUNTER — Encounter: Payer: 59 | Admitting: Internal Medicine

## 2018-07-25 DIAGNOSIS — G4733 Obstructive sleep apnea (adult) (pediatric): Secondary | ICD-10-CM | POA: Diagnosis not present

## 2018-07-26 NOTE — Progress Notes (Signed)
Subjective:    Patient ID: Angel Williamson, female    DOB: [birth year], 62 y.o.   MRN: [id number]  HPI She is here for a physical exam.   She fractured her right foot over the summer and was in a boot for 3 months.  It has healed now.    She has no concerns.   Medications and allergies reviewed with patient and updated if appropriate.  Patient Active Problem List   Diagnosis Date Noted  . Anxiety 07/22/2017  . OSA (obstructive sleep apnea), Severe 08/22/2016  . Prediabetes 01/11/2016  . Obesity 01/11/2016  . Skin cancer, basal cell 04/18/2012  . COLONIC POLYPS, HX OF 12/13/2009  . Osteopenia 03/29/2009  . Essential hypertension 11/14/2007    Current Outpatient Medications on File Prior to Visit  Medication Sig Dispense Refill  . Calcium Carb-Cholecalciferol (CALCIUM 1000 + D PO) Take by mouth daily.    . cholecalciferol (VITAMIN D) 1000 UNITS tablet Take 1,000 Units by mouth daily.    . fluticasone (FLONASE) 50 MCG/ACT nasal spray USE 1 SPRAY IN EACH NOSTRIL TWICE A DAY AS NEEDED 16 g 5   No current facility-administered medications on file prior to visit.     Past Medical History:  Diagnosis Date  . Breast fibrocystic disorder   . Calcaneus fracture 2010   no surgery  . History of colonic polyps    adenomatous  . Hypertension   . Obesity   . OSA (obstructive sleep apnea)   . Osteopenia     Past Surgical History:  Procedure Laterality Date  . BREAST CYST ASPIRATION    . COLONOSCOPY  2014   negative  . COLONOSCOPY W/ POLYPECTOMY     adenomatous polyps 2003 & 2007 Due 2012. Dr.Kaplan   . G 2 P 2    . no prior surgery      Social History   Socioeconomic History  . Marital status: Married    Spouse name: Not on file  . Number of children: Not on file  . Years of education: Not on file  . Highest education level: Not on file  Occupational History  . Occupation: Surveyor, minerals  Social Needs  . Financial resource strain: Not on file  . Food  insecurity:    Worry: Not on file    Inability: Not on file  . Transportation needs:    Medical: Not on file    Non-medical: Not on file  Tobacco Use  . Smoking status: Former Smoker    Packs/day: 1.00    Years: 12.00    Pack years: 12.00    Types: Cigarettes    Last attempt to quit: 10/29/1986    Years since quitting: 31.7  . Smokeless tobacco: Never Used  Substance and Sexual Activity  . Alcohol use: No  . Drug use: No  . Sexual activity: Not on file  Lifestyle  . Physical activity:    Days per week: Not on file    Minutes per session: Not on file  . Stress: Not on file  Relationships  . Social connections:    Talks on phone: Not on file    Gets together: Not on file    Attends religious service: Not on file    Active member of club or organization: Not on file    Attends meetings of clubs or organizations: Not on file    Relationship status: Not on file  Other Topics Concern  . Not on file  Social History  Narrative  . Not on file    Family History  Problem Relation Age of Onset  . Cancer Paternal Grandmother        stomach  . Stomach cancer Paternal Grandmother   . Diabetes Maternal Grandfather   . Stroke Paternal Grandfather 49  . Colon polyps Father   . Anemia Mother   . Anemia Other        Cousin   . Colon cancer Neg Hx   . Esophageal cancer Neg Hx   . Rectal cancer Neg Hx     Review of Systems  Constitutional: Negative for chills and fever.  Eyes: Negative for visual disturbance.  Respiratory: Negative for cough, shortness of breath and wheezing.   Cardiovascular: Negative for chest pain, palpitations and leg swelling.  Gastrointestinal: Negative for abdominal pain, blood in stool, constipation, diarrhea and nausea.       No gerd  Genitourinary: Negative for dysuria and hematuria.  Musculoskeletal: Negative for arthralgias and back pain.  Skin: Negative for color change and rash.  Neurological: Negative for dizziness and light-headedness.    Psychiatric/Behavioral: Negative for dysphoric mood. The patient is nervous/anxious.        Objective:   Vitals:   07/28/18 0851  BP: 126/80  Pulse: 64  Resp: 16  Temp: 97.7 F (36.5 C)  SpO2: 98%   Filed Weights   07/28/18 0851  Weight: 192 lb (87.1 kg)   Body mass index is 35.12 kg/m.  Wt Readings from Last 3 Encounters:  07/28/18 192 lb (87.1 kg)  01/21/18 192 lb (87.1 kg)  11/01/17 193 lb (87.5 kg)     Physical Exam Constitutional: She appears well-developed and well-nourished. No distress.  HENT:  Head: Normocephalic and atraumatic.  Right Ear: External ear normal. Normal ear canal and TM Left Ear: External ear normal.  Normal ear canal and TM Mouth/Throat: Oropharynx is clear and moist.  Eyes: Conjunctivae and EOM are normal.  Neck: Neck supple. No tracheal deviation present. No thyromegaly present.  No carotid bruit  Cardiovascular: Normal rate, regular rhythm and normal heart sounds.   No murmur heard.  No edema. Pulmonary/Chest: Effort normal and breath sounds normal. No respiratory distress. She has no wheezes. She has no rales.  Breast: deferred to Gyn Abdominal: Soft. She exhibits no distension. There is no tenderness.  Lymphadenopathy: She has no cervical adenopathy.  Skin: Skin is warm and dry. She is not diaphoretic.  Psychiatric: She has a normal mood and affect. Her behavior is normal.        Assessment & Plan:   Physical exam: Screening blood work  Ordered Immunizations   Td declined , flu vaccine declined, discussed shingrix Colonoscopy   Up to date  Mammogram   No up to date - will schedule Gyn  Up to date  Dexa  Up to date  Eye exams   Up to date  EKG     Done 2015 Exercise  None - fracture foot over the summer - was in a boot for three months, active Weight  Encouraged weight loss Skin  No concerns Substance abuse   none  See Problem List for Assessment and Plan of chronic medical problems.   FU in 6 months

## 2018-07-26 NOTE — Patient Instructions (Addendum)
Tests ordered today. Your results will be released to MyChart (or called to you) after review, usually within 72hours after test completion. If any changes need to be made, you will be notified at that same time.  All other Health Maintenance issues reviewed.   All recommended immunizations and age-appropriate screenings are up-to-date or discussed.  No immunizations administered today.   Medications reviewed and updated.  Changes include :   none  Your prescription(s) have been submitted to your pharmacy. Please take as directed and contact our office if you believe you are having problem(s) with the medication(s).  Please followup in 6 months    Health Maintenance, Female Adopting a healthy lifestyle and getting preventive care can go a long way to promote health and wellness. Talk with your health care provider about what schedule of regular examinations is right for you. This is a good chance for you to check in with your provider about disease prevention and staying healthy. In between checkups, there are plenty of things you can do on your own. Experts have done a lot of research about which lifestyle changes and preventive measures are most likely to keep you healthy. Ask your health care provider for more information. Weight and diet Eat a healthy diet  Be sure to include plenty of vegetables, fruits, low-fat dairy products, and lean protein.  Do not eat a lot of foods high in solid fats, added sugars, or salt.  Get regular exercise. This is one of the most important things you can do for your health. ? Most adults should exercise for at least 150 minutes each week. The exercise should increase your heart rate and make you sweat (moderate-intensity exercise). ? Most adults should also do strengthening exercises at least twice a week. This is in addition to the moderate-intensity exercise.  Maintain a healthy weight  Body mass index (BMI) is a measurement that can be used to  identify possible weight problems. It estimates body fat based on height and weight. Your health care provider can help determine your BMI and help you achieve or maintain a healthy weight.  For females 20 years of age and older: ? A BMI below 18.5 is considered underweight. ? A BMI of 18.5 to 24.9 is normal. ? A BMI of 25 to 29.9 is considered overweight. ? A BMI of 30 and above is considered obese.  Watch levels of cholesterol and blood lipids  You should start having your blood tested for lipids and cholesterol at 62 years of age, then have this test every 5 years.  You may need to have your cholesterol levels checked more often if: ? Your lipid or cholesterol levels are high. ? You are older than 62 years of age. ? You are at high risk for heart disease.  Cancer screening Lung Cancer  Lung cancer screening is recommended for adults 55-80 years old who are at high risk for lung cancer because of a history of smoking.  A yearly low-dose CT scan of the lungs is recommended for people who: ? Currently smoke. ? Have quit within the past 15 years. ? Have at least a 30-pack-year history of smoking. A pack year is smoking an average of one pack of cigarettes a day for 1 year.  Yearly screening should continue until it has been 15 years since you quit.  Yearly screening should stop if you develop a health problem that would prevent you from having lung cancer treatment.  Breast Cancer  Practice breast self-awareness.   This means understanding how your breasts normally appear and feel.  It also means doing regular breast self-exams. Let your health care provider know about any changes, no matter how small.  If you are in your 20s or 30s, you should have a clinical breast exam (CBE) by a health care provider every 1-3 years as part of a regular health exam.  If you are 40 or older, have a CBE every year. Also consider having a breast X-ray (mammogram) every year.  If you have a  family history of breast cancer, talk to your health care provider about genetic screening.  If you are at high risk for breast cancer, talk to your health care provider about having an MRI and a mammogram every year.  Breast cancer gene (BRCA) assessment is recommended for women who have family members with BRCA-related cancers. BRCA-related cancers include: ? Breast. ? Ovarian. ? Tubal. ? Peritoneal cancers.  Results of the assessment will determine the need for genetic counseling and BRCA1 and BRCA2 testing.  Cervical Cancer Your health care provider may recommend that you be screened regularly for cancer of the pelvic organs (ovaries, uterus, and vagina). This screening involves a pelvic examination, including checking for microscopic changes to the surface of your cervix (Pap test). You may be encouraged to have this screening done every 3 years, beginning at age 21.  For women ages 30-65, health care providers may recommend pelvic exams and Pap testing every 3 years, or they may recommend the Pap and pelvic exam, combined with testing for human papilloma virus (HPV), every 5 years. Some types of HPV increase your risk of cervical cancer. Testing for HPV may also be done on women of any age with unclear Pap test results.  Other health care providers may not recommend any screening for nonpregnant women who are considered low risk for pelvic cancer and who do not have symptoms. Ask your health care provider if a screening pelvic exam is right for you.  If you have had past treatment for cervical cancer or a condition that could lead to cancer, you need Pap tests and screening for cancer for at least 20 years after your treatment. If Pap tests have been discontinued, your risk factors (such as having a new sexual partner) need to be reassessed to determine if screening should resume. Some women have medical problems that increase the chance of getting cervical cancer. In these cases, your  health care provider may recommend more frequent screening and Pap tests.  Colorectal Cancer  This type of cancer can be detected and often prevented.  Routine colorectal cancer screening usually begins at 62 years of age and continues through 62 years of age.  Your health care provider may recommend screening at an earlier age if you have risk factors for colon cancer.  Your health care provider may also recommend using home test kits to check for hidden blood in the stool.  A small camera at the end of a tube can be used to examine your colon directly (sigmoidoscopy or colonoscopy). This is done to check for the earliest forms of colorectal cancer.  Routine screening usually begins at age 50.  Direct examination of the colon should be repeated every 5-10 years through 62 years of age. However, you may need to be screened more often if early forms of precancerous polyps or small growths are found.  Skin Cancer  Check your skin from head to toe regularly.  Tell your health care provider about any   moles or changes in moles, especially if there is a change in a mole's shape or color.  Also tell your health care provider if you have a mole that is larger than the size of a pencil eraser.  Always use sunscreen. Apply sunscreen liberally and repeatedly throughout the day.  Protect yourself by wearing long sleeves, pants, a wide-brimmed hat, and sunglasses whenever you are outside.  Heart disease, diabetes, and high blood pressure  High blood pressure causes heart disease and increases the risk of stroke. High blood pressure is more likely to develop in: ? People who have blood pressure in the high end of the normal range (130-139/85-89 mm Hg). ? People who are overweight or obese. ? People who are African American.  If you are 67-79 years of age, have your blood pressure checked every 3-5 years. If you are 60 years of age or older, have your blood pressure checked every year. You  should have your blood pressure measured twice-once when you are at a hospital or clinic, and once when you are not at a hospital or clinic. Record the average of the two measurements. To check your blood pressure when you are not at a hospital or clinic, you can use: ? An automated blood pressure machine at a pharmacy. ? A home blood pressure monitor.  If you are between 61 years and 57 years old, ask your health care provider if you should take aspirin to prevent strokes.  Have regular diabetes screenings. This involves taking a blood sample to check your fasting blood sugar level. ? If you are at a normal weight and have a low risk for diabetes, have this test once every three years after 62 years of age. ? If you are overweight and have a high risk for diabetes, consider being tested at a younger age or more often. Preventing infection Hepatitis B  If you have a higher risk for hepatitis B, you should be screened for this virus. You are considered at high risk for hepatitis B if: ? You were born in a country where hepatitis B is common. Ask your health care provider which countries are considered high risk. ? Your parents were born in a high-risk country, and you have not been immunized against hepatitis B (hepatitis B vaccine). ? You have HIV or AIDS. ? You use needles to inject street drugs. ? You live with someone who has hepatitis B. ? You have had sex with someone who has hepatitis B. ? You get hemodialysis treatment. ? You take certain medicines for conditions, including cancer, organ transplantation, and autoimmune conditions.  Hepatitis C  Blood testing is recommended for: ? Everyone born from [birth year] through 1965. ? Anyone with known risk factors for hepatitis C.  Sexually transmitted infections (STIs)  You should be screened for sexually transmitted infections (STIs) including gonorrhea and chlamydia if: ? You are sexually active and are younger than 62 years of age. ? You  are older than 62 years of age and your health care provider tells you that you are at risk for this type of infection. ? Your sexual activity has changed since you were last screened and you are at an increased risk for chlamydia or gonorrhea. Ask your health care provider if you are at risk.  If you do not have HIV, but are at risk, it may be recommended that you take a prescription medicine daily to prevent HIV infection. This is called pre-exposure prophylaxis (PrEP). You are considered at risk  if: ? You are sexually active and do not regularly use condoms or know the HIV status of your partner(s). ? You take drugs by injection. ? You are sexually active with a partner who has HIV.  Talk with your health care provider about whether you are at high risk of being infected with HIV. If you choose to begin PrEP, you should first be tested for HIV. You should then be tested every 3 months for as long as you are taking PrEP. Pregnancy  If you are premenopausal and you may become pregnant, ask your health care provider about preconception counseling.  If you may become pregnant, take 400 to 800 micrograms (mcg) of folic acid every day.  If you want to prevent pregnancy, talk to your health care provider about birth control (contraception). Osteoporosis and menopause  Osteoporosis is a disease in which the bones lose minerals and strength with aging. This can result in serious bone fractures. Your risk for osteoporosis can be identified using a bone density scan.  If you are 19 years of age or older, or if you are at risk for osteoporosis and fractures, ask your health care provider if you should be screened.  Ask your health care provider whether you should take a calcium or vitamin D supplement to lower your risk for osteoporosis.  Menopause may have certain physical symptoms and risks.  Hormone replacement therapy may reduce some of these symptoms and risks. Talk to your health care  provider about whether hormone replacement therapy is right for you. Follow these instructions at home:  Schedule regular health, dental, and eye exams.  Stay current with your immunizations.  Do not use any tobacco products including cigarettes, chewing tobacco, or electronic cigarettes.  If you are pregnant, do not drink alcohol.  If you are breastfeeding, limit how much and how often you drink alcohol.  Limit alcohol intake to no more than 1 drink per day for nonpregnant women. One drink equals 12 ounces of beer, 5 ounces of wine, or 1 ounces of hard liquor.  Do not use street drugs.  Do not share needles.  Ask your health care provider for help if you need support or information about quitting drugs.  Tell your health care provider if you often feel depressed.  Tell your health care provider if you have ever been abused or do not feel safe at home. This information is not intended to replace advice given to you by your health care provider. Make sure you discuss any questions you have with your health care provider. Document Released: 04/30/2011 Document Revised: 03/22/2016 Document Reviewed: 07/19/2015 Elsevier Interactive Patient Education  [employer].

## 2018-07-28 ENCOUNTER — Ambulatory Visit (INDEPENDENT_AMBULATORY_CARE_PROVIDER_SITE_OTHER): Payer: 59 | Admitting: Internal Medicine

## 2018-07-28 ENCOUNTER — Encounter: Payer: Self-pay | Admitting: Internal Medicine

## 2018-07-28 ENCOUNTER — Other Ambulatory Visit (INDEPENDENT_AMBULATORY_CARE_PROVIDER_SITE_OTHER): Payer: 59

## 2018-07-28 VITALS — BP 126/80 | HR 64 | Temp 97.7°F | Resp 16 | Ht 62.0 in | Wt 192.0 lb

## 2018-07-28 DIAGNOSIS — G4733 Obstructive sleep apnea (adult) (pediatric): Secondary | ICD-10-CM

## 2018-07-28 DIAGNOSIS — Z6834 Body mass index (BMI) 34.0-34.9, adult: Secondary | ICD-10-CM

## 2018-07-28 DIAGNOSIS — R7303 Prediabetes: Secondary | ICD-10-CM | POA: Diagnosis not present

## 2018-07-28 DIAGNOSIS — M85859 Other specified disorders of bone density and structure, unspecified thigh: Secondary | ICD-10-CM

## 2018-07-28 DIAGNOSIS — Z0000 Encounter for general adult medical examination without abnormal findings: Secondary | ICD-10-CM

## 2018-07-28 DIAGNOSIS — I10 Essential (primary) hypertension: Secondary | ICD-10-CM | POA: Diagnosis not present

## 2018-07-28 DIAGNOSIS — F419 Anxiety disorder, unspecified: Secondary | ICD-10-CM | POA: Diagnosis not present

## 2018-07-28 DIAGNOSIS — E6609 Other obesity due to excess calories: Secondary | ICD-10-CM

## 2018-07-28 LAB — HEMOGLOBIN A1C: Hgb A1c MFr Bld: 6.2 % (ref 4.6–6.5)

## 2018-07-28 LAB — CBC WITH DIFFERENTIAL/PLATELET
BASOS ABS: 0.1 10*3/uL (ref 0.0–0.1)
Basophils Relative: 0.8 % (ref 0.0–3.0)
Eosinophils Absolute: 0.2 10*3/uL (ref 0.0–0.7)
Eosinophils Relative: 3.6 % (ref 0.0–5.0)
HEMATOCRIT: 35.1 % — AB (ref 36.0–46.0)
HEMOGLOBIN: 12.1 g/dL (ref 12.0–15.0)
LYMPHS PCT: 29.8 % (ref 12.0–46.0)
Lymphs Abs: 2 10*3/uL (ref 0.7–4.0)
MCHC: 34.5 g/dL (ref 30.0–36.0)
MCV: 89.7 fl (ref 78.0–100.0)
MONOS PCT: 8.6 % (ref 3.0–12.0)
Monocytes Absolute: 0.6 10*3/uL (ref 0.1–1.0)
NEUTROS ABS: 3.9 10*3/uL (ref 1.4–7.7)
Neutrophils Relative %: 57.2 % (ref 43.0–77.0)
PLATELETS: 281 10*3/uL (ref 150.0–400.0)
RBC: 3.91 Mil/uL (ref 3.87–5.11)
RDW: 13.1 % (ref 11.5–15.5)
WBC: 6.9 10*3/uL (ref 4.0–10.5)

## 2018-07-28 LAB — COMPREHENSIVE METABOLIC PANEL
ALBUMIN: 4.4 g/dL (ref 3.5–5.2)
ALK PHOS: 84 U/L (ref 39–117)
ALT: 12 U/L (ref 0–35)
AST: 13 U/L (ref 0–37)
BILIRUBIN TOTAL: 0.6 mg/dL (ref 0.2–1.2)
BUN: 17 mg/dL (ref 6–23)
CALCIUM: 9.9 mg/dL (ref 8.4–10.5)
CO2: 28 mEq/L (ref 19–32)
Chloride: 105 mEq/L (ref 96–112)
Creatinine, Ser: 0.96 mg/dL (ref 0.40–1.20)
GFR: 62.49 mL/min (ref >60.00)
Glucose, Bld: 100 mg/dL — ABNORMAL HIGH (ref 70–99)
POTASSIUM: 4 meq/L (ref 3.5–5.1)
Sodium: 141 mEq/L (ref 135–145)
TOTAL PROTEIN: 7.6 g/dL (ref 6.0–8.3)

## 2018-07-28 LAB — LIPID PANEL
CHOLESTEROL: 128 mg/dL (ref 0–200)
HDL: 41.9 mg/dL (ref >39.00)
LDL Cholesterol: 64 mg/dL (ref 0–99)
NonHDL: 86.29
TRIGLYCERIDES: 113 mg/dL (ref 0.0–149.0)
Total CHOL/HDL Ratio: 3
VLDL: 22.6 mg/dL (ref 0.0–40.0)

## 2018-07-28 LAB — TSH: TSH: 1.52 u[IU]/mL (ref 0.35–4.50)

## 2018-07-28 MED ORDER — SPIRONOLACTONE 25 MG PO TABS: 25.0000 mg | ORAL_TABLET | Freq: Every day | ORAL | 3 refills | Status: DC

## 2018-07-28 MED ORDER — LORAZEPAM 0.5 MG PO TABS: 0.5000 mg | ORAL_TABLET | Freq: Two times a day (BID) | ORAL | 0 refills | Status: DC | PRN

## 2018-07-28 MED ORDER — LOSARTAN POTASSIUM 50 MG PO TABS: 50.0000 mg | ORAL_TABLET | Freq: Every day | ORAL | 3 refills | Status: DC

## 2018-07-28 NOTE — Assessment & Plan Note (Signed)
Takes 1/2 of a ativan only as needed, does not take often Continue Refilled today

## 2018-07-28 NOTE — Assessment & Plan Note (Signed)
On cpap nightly

## 2018-07-28 NOTE — Assessment & Plan Note (Signed)
dexa up to date Continue calcium and vitamin d

## 2018-07-28 NOTE — Assessment & Plan Note (Signed)
Check a1c Low sugar / carb diet Stressed regular exercise, weight loss  

## 2018-07-28 NOTE — Assessment & Plan Note (Signed)
BP well controlled Current regimen effective and well tolerated - discussed losartan recall - she will let me know if she wants to change Continue current medications at current doses Cmp, tsh, cbc

## 2018-07-28 NOTE — Assessment & Plan Note (Signed)
Encouraged weight loss 

## 2018-08-25 DIAGNOSIS — G4733 Obstructive sleep apnea (adult) (pediatric): Secondary | ICD-10-CM | POA: Diagnosis not present

## 2018-09-09 ENCOUNTER — Ambulatory Visit (INDEPENDENT_AMBULATORY_CARE_PROVIDER_SITE_OTHER): Payer: 59 | Admitting: Pulmonary Disease

## 2018-09-09 ENCOUNTER — Encounter: Payer: Self-pay | Admitting: Pulmonary Disease

## 2018-09-09 VITALS — BP 138/82 | HR 66 | Ht 63.0 in | Wt 196.3 lb

## 2018-09-09 DIAGNOSIS — G4733 Obstructive sleep apnea (adult) (pediatric): Secondary | ICD-10-CM

## 2018-09-09 NOTE — Progress Notes (Signed)
@Patient  ID: Angel Williamson, female    DOB: 1956/01/04, 62 y.o.   MRN: [id number]  Chief Complaint  Patient presents with  . Follow-up    OSA management    Referring provider: Binnie Rail, MD  HPI:  62 year old female former smoker followed in our office for OSA managed with CPAP   PMH: Hypertension, prediabetic Smoker/ Smoking History: Former Smoker. 12 pack years. Quit 1988.  Pt of: [place]  09/09/2018  - Visit   62 year old female patient following up for 1 year follow-up with CPAP and OSA management.  Patient reports CPAP is going well with no leaks.  Patient reports she sleeps every night with it.  Patient reports she cannot sleep without her CPAP.  Patient has no complaints or issues with her CPAP machine.  Patient reports CPAP is 62 years old.  CPAP compliance report showing 30 out of 30 days use.  All 30 days greater than 4 hours.  Average usage days 6 hours and 32 minutes.  APAP settings 5-15.  AHI 0.8.  No leak seen on compliance report.  Patient would not like flu vaccine today patient reports she does not receive the flu vaccine.   Tests:  HST 06/04/16 >> AHI 46.8, SaO2 low 72% Auto CPAP 08/21/17 to 09/19/17 >> used on 30 of 30 nights with average 5 hrs 40 min.  Average AHI 0.4 with median CPAP 7 and 95 th percentile CPAP 10 cm H2O  FENO:  No results found for: NITRICOXIDE  PFT: No flowsheet data found.  Imaging: No results found.  Chart Review:    Specialty Problems      Pulmonary Problems   OSA (obstructive sleep apnea), Severe    HST 06/04/16 >> AHI 46.8, SaO2 low 72% On CPAP          Allergies  Allergen Reactions  . Hydrochlorothiazide     REACTION: low potassium  . Shrimp [Shellfish Allergy]   . Benazepril Hcl     REACTION: cough     There is no immunization history on file for this patient.  Educated on need for flu vaccine, patient refused Educated patient on need for pneumonia vaccine age 15 patient agrees  Past Medical History:    Diagnosis Date  . Breast fibrocystic disorder   . Calcaneus fracture 2010   no surgery  . History of colonic polyps    adenomatous  . Hypertension   . Obesity   . OSA (obstructive sleep apnea)   . Osteopenia     Tobacco History: Social History   Tobacco Use  Smoking Status Former Smoker  . Packs/day: 1.00  . Years: 12.00  . Pack years: 12.00  . Types: Cigarettes  . Last attempt to quit: 10/29/1986  . Years since quitting: 31.8  Smokeless Tobacco Never Used   Counseling given: Yes  Continue to not smoke  Outpatient Encounter Medications as of 09/09/2018  Medication Sig  . Calcium Carb-Cholecalciferol (CALCIUM 1000 + D PO) Take by mouth daily.  . cholecalciferol (VITAMIN D) 1000 UNITS tablet Take 1,000 Units by mouth daily.  . fluticasone (FLONASE) 50 MCG/ACT nasal spray USE 1 SPRAY IN EACH NOSTRIL TWICE A DAY AS NEEDED  . LORazepam (ATIVAN) 0.5 MG tablet Take 1 tablet (0.5 mg total) by mouth 2 (two) times daily as needed for anxiety.  Marland Kitchen losartan (COZAAR) 50 MG tablet Take 1 tablet (50 mg total) by mouth daily.  Marland Kitchen spironolactone (ALDACTONE) 25 MG tablet Take 1 tablet (25 mg  total) by mouth daily.   No facility-administered encounter medications on file as of 09/09/2018.      Review of Systems  Review of Systems  Constitutional: Negative for chills, fatigue, fever and unexpected weight change.  HENT: Negative for congestion, ear pain, postnasal drip, sinus pressure and sinus pain.   Respiratory: Negative for cough, chest tightness, shortness of breath and wheezing.   Cardiovascular: Negative for chest pain and palpitations.  Gastrointestinal: Negative for blood in stool, diarrhea, nausea and vomiting.  Genitourinary: Negative for dysuria, frequency and urgency.  Musculoskeletal: Negative for arthralgias.  Skin: Negative for color change.  Allergic/Immunologic: Negative for environmental allergies and food allergies.  Neurological: Negative for dizziness,  light-headedness and headaches.  Psychiatric/Behavioral: Negative for dysphoric mood. The patient is not nervous/anxious.   All other systems reviewed and are negative.    Physical Exam  BP 138/82 (BP Location: Left Arm, Cuff Size: Normal)   Pulse 66   Ht 5\' 3"  (1.6 m)   Wt 196 lb 4.8 oz (89 kg)   SpO2 100%   BMI 34.77 kg/m   Wt Readings from Last 5 Encounters:  09/09/18 196 lb 4.8 oz (89 kg)  07/28/18 192 lb (87.1 kg)  01/21/18 192 lb (87.1 kg)  11/01/17 193 lb (87.5 kg)  09/23/17 193 lb 12.8 oz (87.9 kg)    Physical Exam  Constitutional: She is oriented to person, place, and time and well-developed, well-nourished, and in no distress. No distress.  HENT:  Head: Normocephalic and atraumatic.  Right Ear: Hearing, tympanic membrane, external ear and ear canal normal.  Left Ear: Hearing, tympanic membrane, external ear and ear canal normal.  Nose: Nose normal. Right sinus exhibits no maxillary sinus tenderness and no frontal sinus tenderness. Left sinus exhibits no maxillary sinus tenderness and no frontal sinus tenderness.  Mouth/Throat: Uvula is midline and oropharynx is clear and moist. No oropharyngeal exudate.  + Mallampati 4  Eyes: Pupils are equal, round, and reactive to light.  Neck: Normal range of motion. Neck supple.  Cardiovascular: Normal rate, regular rhythm and normal heart sounds.  Pulmonary/Chest: Effort normal and breath sounds normal. No accessory muscle usage. No respiratory distress. She has no decreased breath sounds. She has no wheezes. She has no rhonchi.  Musculoskeletal: Normal range of motion.  Lymphadenopathy:    She has no cervical adenopathy.  Neurological: She is alert and oriented to person, place, and time. Gait normal.  Skin: Skin is warm and dry. She is not diaphoretic. No erythema.  Psychiatric: Mood, memory, affect and judgment normal.  Nursing note and vitals reviewed.     Lab Results:  CBC    Component Value Date/Time   WBC 6.9  07/28/2018 0951   RBC 3.91 07/28/2018 0951   HGB 12.1 07/28/2018 0951   HCT 35.1 (L) 07/28/2018 0951   PLT 281.0 07/28/2018 0951   MCV 89.7 07/28/2018 0951   MCHC 34.5 07/28/2018 0951   RDW 13.1 07/28/2018 0951   LYMPHSABS 2.0 07/28/2018 0951   MONOABS 0.6 07/28/2018 0951   EOSABS 0.2 07/28/2018 0951   BASOSABS 0.1 07/28/2018 0951    BMET    Component Value Date/Time   NA 141 07/28/2018 0951   K 4.0 07/28/2018 0951   CL 105 07/28/2018 0951   CO2 28 07/28/2018 0951   GLUCOSE 100 (H) 07/28/2018 0951   BUN 17 07/28/2018 0951   CREATININE 0.96 07/28/2018 0951   CALCIUM 9.9 07/28/2018 0951   GFRNONAA 69.37 12/21/2009 1057   GFRAA 75  11/24/2007 1045    BNP No results found for: BNP  ProBNP No results found for: PROBNP    Assessment & Plan:   62 year old female patient presenting today for one-year follow-up for OSA management on CPAP.  Patient continue on CPAP.  We will keep settings the same.  Patient to follow-up in 1 year or sooner if she is having difficulty using her machine.  Patient refused flu vaccine today.  OSA (obstructive sleep apnea), Severe Follow up in 1 year   We recommend that you continue using your CPAP daily >>>Keep up the hard work using your device >>> Goal should be wearing this for the entire night that you are sleeping, at least 4 to 6 hours  Remember:  . Do not drive or operate heavy machinery if tired or drowsy.  . Please notify the supply company and office if you are unable to use your device regularly due to missing supplies or machine being broken.  . Work on maintaining a healthy weight and following your recommended nutrition plan  . Maintain proper daily exercise and movement  . Maintaining proper use of your device can also help improve management of other chronic illnesses such as: Blood pressure, blood sugars, and weight management.   BiPAP/ CPAP Cleaning:  >>>Clean weekly, with Dawn soap, and bottle brush.  Set up to air  dry.      Lauraine Rinne, NP 09/09/2018

## 2018-09-09 NOTE — Patient Instructions (Addendum)
Follow up in 1 year   We recommend that you continue using your CPAP daily >>>Keep up the hard work using your device >>> Goal should be wearing this for the entire night that you are sleeping, at least 4 to 6 hours  Remember:  . Do not drive or operate heavy machinery if tired or drowsy.  . Please notify the supply company and office if you are unable to use your device regularly due to missing supplies or machine being broken.  . Work on maintaining a healthy weight and following your recommended nutrition plan  . Maintain proper daily exercise and movement  . Maintaining proper use of your device can also help improve management of other chronic illnesses such as: Blood pressure, blood sugars, and weight management.   BiPAP/ CPAP Cleaning:  >>>Clean weekly, with Dawn soap, and bottle brush.  Set up to air dry.    November/2019 we will be moving! We will no longer be at our [place] location.  Be on the look out for a post card/mailer to let you know we have officially moved.  Our new address and phone number will be:  [address] [place], [place] [address] Telephone number: [phone]  It is flu season:   >>>Remember to be washing your hands regularly, using hand sanitizer, be careful to use around herself with has contact with people who are sick will increase her chances of getting sick yourself. >>> Best ways to protect herself from the flu: Receive the yearly flu vaccine, practice good hand hygiene washing with soap and also using hand sanitizer when available, eat a nutritious meals, get adequate rest, hydrate appropriately   Please contact the office if your symptoms worsen or you have concerns that you are not improving.   Thank you for choosing [facility] for your healthcare, and for allowing [place] to partner with you on your healthcare journey. I am thankful to be able to provide care to you today.   Wyn Quaker FNP-C    Sleep Apnea Sleep apnea is  a condition that affects breathing. People with sleep apnea have moments during sleep when their breathing pauses briefly or gets shallow. Sleep apnea can cause these symptoms:  Trouble staying asleep.  Sleepiness or tiredness during the day.  Irritability.  Loud snoring.  Morning headaches.  Trouble concentrating.  Forgetting things.  Less interest in sex.  Being sleepy for no reason.  Mood swings.  Personality changes.  Depression.  Waking up a lot during the night to pee (urinate).  Dry mouth.  Sore throat.  Follow these instructions at home:  Make any changes in your routine that your doctor recommends.  Eat a healthy, well-balanced diet.  Take over-the-counter and prescription medicines only as told by your doctor.  Avoid using alcohol, calming medicines (sedatives), and narcotic medicines.  Take steps to lose weight if you are overweight.  If you were given a machine (device) to use while you sleep, use it only as told by your doctor.  Do not use any tobacco products, such as cigarettes, chewing tobacco, and e-cigarettes. If you need help quitting, ask your doctor.  Keep all follow-up visits as told by your doctor. This is important. Contact a doctor if:  The machine that you were given to use during sleep is uncomfortable or does not seem to be working.  Your symptoms do not get better.  Your symptoms get worse. Get help right away if:  Your chest hurts.  You have trouble breathing in enough air (shortness of breath).  You have an uncomfortable feeling in your back, arms, or stomach.  You have trouble talking.  One side of your body feels weak.  A part of your face is hanging down (drooping). These symptoms may be an emergency. Do not wait to see if the symptoms will go away. Get medical help right away. Call your local emergency services (911 in the [place] Do not drive yourself to the hospital. This information is not intended to replace  advice given to you by your health care provider. Make sure you discuss any questions you have with your health care provider. Document Released: 07/24/2008 Document Revised: 06/10/2016 Document Reviewed: 07/25/2015 Elsevier Interactive Patient Education  2018 [employer].  CPAP and BiPAP Information CPAP and BiPAP are methods of helping a person breathe with the use of air pressure. CPAP stands for "continuous positive airway pressure." BiPAP stands for "bi-level positive airway pressure." In both methods, air is blown through your nose or mouth and into your air passages to help you breathe well. CPAP and BiPAP use different amounts of pressure to blow air. With CPAP, the amount of pressure stays the same while you breathe in and out. With BiPAP, the amount of pressure is increased when you breathe in (inhale) so that you can take larger breaths. Your health care provider will recommend whether CPAP or BiPAP would be more helpful for you. Why are CPAP and BiPAP treatments used? CPAP or BiPAP can be helpful if you have:  Sleep apnea.  Chronic obstructive pulmonary disease (COPD).  Heart failure.  Medical conditions that weaken the muscles of the chest including muscular dystrophy, or neurological diseases such as amyotrophic lateral sclerosis (ALS).  Other problems that cause breathing to be weak, abnormal, or difficult.  CPAP is most commonly used for obstructive sleep apnea (OSA) to keep the airways from collapsing when the muscles relax during sleep. How is CPAP or BiPAP administered? Both CPAP and BiPAP are provided by a small machine with a flexible plastic tube that attaches to a plastic mask. You wear the mask. Air is blown through the mask into your nose or mouth. The amount of pressure that is used to blow the air can be adjusted on the machine. Your health care provider will determine the pressure setting that should be used based on your individual needs. When should CPAP or  BiPAP be used? In most cases, the mask only needs to be worn during sleep. Generally, the mask needs to be worn throughout the night and during any daytime naps. People with certain medical conditions may also need to wear the mask at other times when they are awake. Follow instructions from your health care provider about when to use the machine. What are some tips for using the mask?  Because the mask needs to be snug, some people feel trapped or closed-in (claustrophobic) when first using the mask. If you feel this way, you may need to get used to the mask. One way to do this is by holding the mask loosely over your nose or mouth and then gradually applying the mask more snugly. You can also gradually increase the amount of time that you use the mask.  Masks are available in various types and sizes. Some fit over your mouth and nose while others fit over just your nose. If your mask does not fit well, talk with your health care provider about getting a different one.  If  you are using a mask that fits over your nose and you tend to breathe through your mouth, a chin strap may be applied to help keep your mouth closed.  The CPAP and BiPAP machines have alarms that may sound if the mask comes off or develops a leak.  If you have trouble with the mask, it is very important that you talk with your health care provider about finding a way to make the mask easier to tolerate. Do not stop using the mask. Stopping the use of the mask could have a negative impact on your health. What are some tips for using the machine?  Place your CPAP or BiPAP machine on a secure table or stand near an electrical outlet.  Know where the on/off switch is located on the machine.  Follow instructions from your health care provider about how to set the pressure on your machine and when you should use it.  Do not eat or drink while the CPAP or BiPAP machine is on. Food or fluids could get pushed into your lungs by the  pressure of the CPAP or BiPAP.  Do not smoke. Tobacco smoke residue can damage the machine.  For home use, CPAP and BiPAP machines can be rented or purchased through home health care companies. Many different brands of machines are available. Renting a machine before purchasing may help you find out which particular machine works well for you.  Keep the CPAP or BiPAP machine and attachments clean. Ask your health care provider for specific instructions. Get help right away if:  You have redness or open areas around your nose or mouth where the mask fits.  You have trouble using the CPAP or BiPAP machine.  You cannot tolerate wearing the CPAP or BiPAP mask.  You have pain, discomfort, and bloating in your abdomen. Summary  CPAP and BiPAP are methods of helping a person breathe with the use of air pressure.  Both CPAP and BiPAP are provided by a small machine with a flexible plastic tube that attaches to a plastic mask.  If you have trouble with the mask, it is very important that you talk with your health care provider about finding a way to make the mask easier to tolerate. This information is not intended to replace advice given to you by your health care provider. Make sure you discuss any questions you have with your health care provider. Document Released: 07/13/2004 Document Revised: 09/03/2016 Document Reviewed: 09/03/2016 Elsevier Interactive Patient Education  2017 [employer].

## 2018-09-09 NOTE — Assessment & Plan Note (Signed)
Follow up in 1 year   We recommend that you continue using your CPAP daily >>>Keep up the hard work using your device >>> Goal should be wearing this for the entire night that you are sleeping, at least 4 to 6 hours  Remember:  . Do not drive or operate heavy machinery if tired or drowsy.  . Please notify the supply company and office if you are unable to use your device regularly due to missing supplies or machine being broken.  . Work on maintaining a healthy weight and following your recommended nutrition plan  . Maintain proper daily exercise and movement  . Maintaining proper use of your device can also help improve management of other chronic illnesses such as: Blood pressure, blood sugars, and weight management.   BiPAP/ CPAP Cleaning:  >>>Clean weekly, with Dawn soap, and bottle brush.  Set up to air dry.

## 2018-09-22 NOTE — Progress Notes (Signed)
Subjective:    Patient ID: Angel Williamson, female    DOB: May 24, 1956, 62 y.o.   MRN: [id number]  HPI The patient is here for an acute visit.   Lower back pain:  She was having right lower back pain for a couple of weeks. When she was still there was no pain.  She had pain with moving around.  She thought it was a strained muscle, which she has had in the past.  She does not recall any specific injury or activity that may have caused it.  Her dog did push her down at one point, so that could have been the cause.   She took some advil and tylenol which helped.    Thursday night she was sitting and felt her pants were wet.  It was clear fluid.  There was some mucus after she wiped her rectum. She thinks it came out of her rectum, but can't be positive.  She denies changes in bowels, abdominal pain, dysuria, increased urinary frequency.   After this happened she has not felt any of her back pain.  She has not had any symptoms like this since.        Medications and allergies reviewed with patient and updated if appropriate.  Patient Active Problem List   Diagnosis Date Noted  . Anxiety 07/22/2017  . OSA (obstructive sleep apnea), Severe 08/22/2016  . Prediabetes 01/11/2016  . Obesity 01/11/2016  . Skin cancer, basal cell 04/18/2012  . COLONIC POLYPS, HX OF 12/13/2009  . Osteopenia 03/29/2009  . Essential hypertension 11/14/2007    Current Outpatient Medications on File Prior to Visit  Medication Sig Dispense Refill  . Calcium Carb-Cholecalciferol (CALCIUM 1000 + D PO) Take by mouth daily.    . cholecalciferol (VITAMIN D) 1000 UNITS tablet Take 1,000 Units by mouth daily.    . fluticasone (FLONASE) 50 MCG/ACT nasal spray USE 1 SPRAY IN EACH NOSTRIL TWICE A DAY AS NEEDED 16 g 5  . LORazepam (ATIVAN) 0.5 MG tablet Take 1 tablet (0.5 mg total) by mouth 2 (two) times daily as needed for anxiety. 20 tablet 0  . losartan (COZAAR) 50 MG tablet Take 1 tablet (50 mg total) by mouth daily. 90  tablet 3  . spironolactone (ALDACTONE) 25 MG tablet Take 1 tablet (25 mg total) by mouth daily. 90 tablet 3   No current facility-administered medications on file prior to visit.     Past Medical History:  Diagnosis Date  . Breast fibrocystic disorder   . Calcaneus fracture 2010   no surgery  . History of colonic polyps    adenomatous  . Hypertension   . Obesity   . OSA (obstructive sleep apnea)   . Osteopenia     Past Surgical History:  Procedure Laterality Date  . BREAST CYST ASPIRATION    . COLONOSCOPY  2014   negative  . COLONOSCOPY W/ POLYPECTOMY     adenomatous polyps 2003 & 2007 Due 2012. Dr.Kaplan   . G 2 P 2    . no prior surgery      Social History   Socioeconomic History  . Marital status: Married    Spouse name: Not on file  . Number of children: Not on file  . Years of education: Not on file  . Highest education level: Not on file  Occupational History  . Occupation: Surveyor, minerals  Social Needs  . Financial resource strain: Not on file  . Food insecurity:    Worry:  Not on file    Inability: Not on file  . Transportation needs:    Medical: Not on file    Non-medical: Not on file  Tobacco Use  . Smoking status: Former Smoker    Packs/day: 1.00    Years: 12.00    Pack years: 12.00    Types: Cigarettes    Last attempt to quit: 10/29/1986    Years since quitting: 31.9  . Smokeless tobacco: Never Used  Substance and Sexual Activity  . Alcohol use: No  . Drug use: No  . Sexual activity: Not on file  Lifestyle  . Physical activity:    Days per week: Not on file    Minutes per session: Not on file  . Stress: Not on file  Relationships  . Social connections:    Talks on phone: Not on file    Gets together: Not on file    Attends religious service: Not on file    Active member of club or organization: Not on file    Attends meetings of clubs or organizations: Not on file    Relationship status: Not on file  Other Topics Concern  . Not on  file  Social History Narrative  . Not on file    Family History  Problem Relation Age of Onset  . Cancer Paternal Grandmother        stomach  . Stomach cancer Paternal Grandmother   . Diabetes Maternal Grandfather   . Stroke Paternal Grandfather 48  . Colon polyps Father   . Anemia Mother   . Anemia Other        Cousin   . Colon cancer Neg Hx   . Esophageal cancer Neg Hx   . Rectal cancer Neg Hx     Review of Systems  Constitutional: Negative for chills and fever.  Gastrointestinal: Positive for constipation (chronic). Negative for abdominal pain, blood in stool, diarrhea and nausea.  Genitourinary: Negative for dysuria, frequency and hematuria.       Objective:   Vitals:   09/23/18 0810  BP: 122/80  Pulse: 65  Resp: 16  Temp: 98.5 F (36.9 C)  SpO2: 99%   BP Readings from Last 3 Encounters:  09/23/18 122/80  09/09/18 138/82  07/28/18 126/80   Wt Readings from Last 3 Encounters:  09/23/18 194 lb 8 oz (88.2 kg)  09/09/18 196 lb 4.8 oz (89 kg)  07/28/18 192 lb (87.1 kg)   Body mass index is 34.45 kg/m.   Physical Exam  Constitutional: She appears well-developed and well-nourished. No distress.  HENT:  Head: Normocephalic and atraumatic.  Abdominal: Soft. She exhibits no distension and no mass. There is no tenderness.  Musculoskeletal: She exhibits no tenderness (lumbar spine or right lower back).  Skin: Skin is warm and dry. She is not diaphoretic.           Assessment & Plan:    See Problem List for Assessment and Plan of chronic medical problems.

## 2018-09-23 ENCOUNTER — Encounter: Payer: Self-pay | Admitting: Internal Medicine

## 2018-09-23 ENCOUNTER — Ambulatory Visit: Payer: 59 | Admitting: Internal Medicine

## 2018-09-23 VITALS — BP 122/80 | HR 65 | Temp 98.5°F | Resp 16 | Ht 63.0 in | Wt 194.5 lb

## 2018-09-23 DIAGNOSIS — M545 Low back pain, unspecified: Secondary | ICD-10-CM | POA: Insufficient documentation

## 2018-09-23 DIAGNOSIS — R198 Other specified symptoms and signs involving the digestive system and abdomen: Secondary | ICD-10-CM | POA: Insufficient documentation

## 2018-09-23 NOTE — Patient Instructions (Signed)
Monitor your symptoms and if they recur please let me know.

## 2018-09-23 NOTE — Assessment & Plan Note (Signed)
Last week she had clear fluid come out of what she thinks was her rectum-when she wiped the area there was some mucus Unlikely to be associated with her right lower back pain that sounds muscular in nature No concerning changes in bowels No urinary symptoms She has not seen any discharge or had any other concerning symptoms since this episode At this point I do not think we need to evaluate, but if symptoms recur she will come back so that we can evaluate further

## 2018-09-23 NOTE — Assessment & Plan Note (Signed)
Right lower back pain sounds like it was muscular in nature since she was not having any pain with rest and only had pain with movement Tylenol and Advil seem to help there are no other concerning symptoms with the pain It has resolved so no further evaluation is necessary

## 2018-09-24 DIAGNOSIS — G4733 Obstructive sleep apnea (adult) (pediatric): Secondary | ICD-10-CM | POA: Diagnosis not present

## 2018-10-09 NOTE — Progress Notes (Signed)
Subjective:    Patient ID: Angel Williamson, female    DOB: 1956/03/16, 62 y.o.   MRN: [id number]  HPI The patient is here for an acute visit.   Severe constipation:  She has had chronic conatipaton but it has been bad for the past two weeks.  She is unsure why it became worse.  She took an enema Saturday and Sunday - and she only went a little after each.  Monday and Tuesday she took MiraLAX.  Wednesday and Thursday she went to the bathroom a little.   Having those few bowel movements did help because she was having significant discomfort and distention in her abdomen, but she still feels very bloated and full.  She feels like she has a lot of stool remaining.  She denies any abdominal pain.  She is passing some gas even today.  She does not take anything for her chronic constipation.  Her baseline is going every couple of days.  Her last colonoscopy was in 2014 and was normal.  She was advised for repeat in 2024.   She was here recently for right mid-lower back pain and it had gone away, but it has come back.  She states she only has it with certain positions or activities.  She is unsure if it is related, but I advised this most likely muscular skeletal given how it is behaving and not related.    Medications and allergies reviewed with patient and updated if appropriate.  Patient Active Problem List   Diagnosis Date Noted  . Right-sided low back pain without sciatica 09/23/2018  . Rectal discharge 09/23/2018  . Anxiety 07/22/2017  . OSA (obstructive sleep apnea), Severe 08/22/2016  . Prediabetes 01/11/2016  . Obesity 01/11/2016  . Skin cancer, basal cell 04/18/2012  . COLONIC POLYPS, HX OF 12/13/2009  . Osteopenia 03/29/2009  . Essential hypertension 11/14/2007    Current Outpatient Medications on File Prior to Visit  Medication Sig Dispense Refill  . Calcium Carb-Cholecalciferol (CALCIUM 1000 + D PO) Take by mouth daily.    . cholecalciferol (VITAMIN D) 1000 UNITS tablet  Take 1,000 Units by mouth daily.    . fluticasone (FLONASE) 50 MCG/ACT nasal spray USE 1 SPRAY IN EACH NOSTRIL TWICE A DAY AS NEEDED 16 g 5  . LORazepam (ATIVAN) 0.5 MG tablet Take 1 tablet (0.5 mg total) by mouth 2 (two) times daily as needed for anxiety. 20 tablet 0  . losartan (COZAAR) 50 MG tablet Take 1 tablet (50 mg total) by mouth daily. 90 tablet 3  . spironolactone (ALDACTONE) 25 MG tablet Take 1 tablet (25 mg total) by mouth daily. 90 tablet 3   No current facility-administered medications on file prior to visit.     Past Medical History:  Diagnosis Date  . Breast fibrocystic disorder   . Calcaneus fracture 2010   no surgery  . History of colonic polyps    adenomatous  . Hypertension   . Obesity   . OSA (obstructive sleep apnea)   . Osteopenia     Past Surgical History:  Procedure Laterality Date  . BREAST CYST ASPIRATION    . COLONOSCOPY  2014   negative  . COLONOSCOPY W/ POLYPECTOMY     adenomatous polyps 2003 & 2007 Due 2012. Dr.Kaplan   . G 2 P 2    . no prior surgery      Social History   Socioeconomic History  . Marital status: Married    Spouse name: Not  on file  . Number of children: Not on file  . Years of education: Not on file  . Highest education level: Not on file  Occupational History  . Occupation: Surveyor, minerals  Social Needs  . Financial resource strain: Not on file  . Food insecurity:    Worry: Not on file    Inability: Not on file  . Transportation needs:    Medical: Not on file    Non-medical: Not on file  Tobacco Use  . Smoking status: Former Smoker    Packs/day: 1.00    Years: 12.00    Pack years: 12.00    Types: Cigarettes    Last attempt to quit: 10/29/1986    Years since quitting: 31.9  . Smokeless tobacco: Never Used  Substance and Sexual Activity  . Alcohol use: No  . Drug use: No  . Sexual activity: Not on file  Lifestyle  . Physical activity:    Days per week: Not on file    Minutes per session: Not on file  .  Stress: Not on file  Relationships  . Social connections:    Talks on phone: Not on file    Gets together: Not on file    Attends religious service: Not on file    Active member of club or organization: Not on file    Attends meetings of clubs or organizations: Not on file    Relationship status: Not on file  Other Topics Concern  . Not on file  Social History Narrative  . Not on file    Family History  Problem Relation Age of Onset  . Cancer Paternal Grandmother        stomach  . Stomach cancer Paternal Grandmother   . Diabetes Maternal Grandfather   . Stroke Paternal Grandfather 63  . Colon polyps Father   . Anemia Mother   . Anemia Other        Cousin   . Colon cancer Neg Hx   . Esophageal cancer Neg Hx   . Rectal cancer Neg Hx     Review of Systems  Constitutional: Negative for chills and fever.  Gastrointestinal: Positive for abdominal distention (bloated) and constipation. Negative for abdominal pain, blood in stool and nausea.       No gerd  Genitourinary: Negative for dysuria, frequency and hematuria.  Neurological: Negative for light-headedness and headaches.       Objective:   Vitals:   10/10/18 0858  BP: 132/78  Pulse: 80  Resp: 16  Temp: 98.5 F (36.9 C)  SpO2: 97%   BP Readings from Last 3 Encounters:  10/10/18 132/78  09/23/18 122/80  09/09/18 138/82   Wt Readings from Last 3 Encounters:  10/10/18 190 lb (86.2 kg)  09/23/18 194 lb 8 oz (88.2 kg)  09/09/18 196 lb 4.8 oz (89 kg)   Body mass index is 33.66 kg/m.   Physical Exam Constitutional:      General: She is not in acute distress.    Appearance: Normal appearance. She is not ill-appearing, toxic-appearing or diaphoretic.  HENT:     Head: Normocephalic and atraumatic.  Abdominal:     General: There is no distension.     Palpations: Abdomen is soft. There is no mass.     Tenderness: There is abdominal tenderness (Diffuse mild tenderness). There is no guarding or rebound.      Hernia: No hernia is present.  Skin:    General: Skin is warm and dry.  Neurological:  Mental Status: She is alert.            Assessment & Plan:    See Problem List for Assessment and Plan of chronic medical problems.

## 2018-10-10 ENCOUNTER — Ambulatory Visit: Payer: 59 | Admitting: Internal Medicine

## 2018-10-10 ENCOUNTER — Encounter: Payer: Self-pay | Admitting: Internal Medicine

## 2018-10-10 ENCOUNTER — Ambulatory Visit (INDEPENDENT_AMBULATORY_CARE_PROVIDER_SITE_OTHER)
Admission: RE | Admit: 2018-10-10 | Discharge: 2018-10-10 | Disposition: A | Discharge status: Home / Self Care | Payer: 59 | Source: Ambulatory Visit | Attending: Internal Medicine | Admitting: Internal Medicine

## 2018-10-10 VITALS — BP 132/78 | HR 80 | Temp 98.5°F | Resp 16 | Ht 63.0 in | Wt 190.0 lb

## 2018-10-10 DIAGNOSIS — K5900 Constipation, unspecified: Secondary | ICD-10-CM | POA: Insufficient documentation

## 2018-10-10 MED ORDER — BISACODYL 10 MG RE SUPP: 10.0000 mg | RECTAL | 0 refills | Status: DC | PRN

## 2018-10-10 NOTE — Patient Instructions (Addendum)
Have an xray of your abdomen today.     Start a stool softener and take miralax daily.     Take another enema.  You can also try a laxative suppository called dulcolax.       Constipation, Adult Constipation is when a person has fewer bowel movements in a week than normal, has difficulty having a bowel movement, or has stools that are dry, hard, or larger than normal. Constipation may be caused by an underlying condition. It may become worse with age if a person takes certain medicines and does not take in enough fluids. Follow these instructions at home: Eating and drinking   Eat foods that have a lot of fiber, such as fresh fruits and vegetables, whole grains, and beans.  Limit foods that are high in fat, low in fiber, or overly processed, such as french fries, hamburgers, cookies, candies, and soda.  Drink enough fluid to keep your urine clear or pale yellow. General instructions  Exercise regularly or as told by your health care provider.  Go to the restroom when you have the urge to go. Do not hold it in.  Take over-the-counter and prescription medicines only as told by your health care provider. These include any fiber supplements.  Practice pelvic floor retraining exercises, such as deep breathing while relaxing the lower abdomen and pelvic floor relaxation during bowel movements.  Watch your condition for any changes.  Keep all follow-up visits as told by your health care provider. This is important. Contact a health care provider if:  You have pain that gets worse.  You have a fever.  You do not have a bowel movement after 4 days.  You vomit.  You are not hungry.  You lose weight.  You are bleeding from the anus.  You have thin, pencil-like stools. Get help right away if:  You have a fever and your symptoms suddenly get worse.  You leak stool or have blood in your stool.  Your abdomen is bloated.  You have severe pain in your abdomen.  You feel  dizzy or you faint. This information is not intended to replace advice given to you by your health care provider. Make sure you discuss any questions you have with your health care provider. Document Released: 07/13/2004 Document Revised: 05/04/2016 Document Reviewed: 04/04/2016 Elsevier Interactive Patient Education  2018 [employer].

## 2018-10-10 NOTE — Assessment & Plan Note (Signed)
Acute on chronic constipation Unlikely to be obstructed given that she is passing gas, but will obtain an abdominal x-ray today to confirm Discussed that we need to treat both her chronic and acute constipation She will repeat an enema and can try Dulcolax suppository if needed.  Discussed that she can also do a warm water enema Start stool softener and/or MiraLAX daily to help with both acute and chronic constipation May also need to try Metamucil or Benefiber If no improvement can consider Linzess She will call with any questions or concerns

## 2018-10-25 DIAGNOSIS — G4733 Obstructive sleep apnea (adult) (pediatric): Secondary | ICD-10-CM | POA: Diagnosis not present

## 2018-11-24 DIAGNOSIS — G4733 Obstructive sleep apnea (adult) (pediatric): Secondary | ICD-10-CM | POA: Diagnosis not present

## 2018-12-24 DIAGNOSIS — G4733 Obstructive sleep apnea (adult) (pediatric): Secondary | ICD-10-CM | POA: Diagnosis not present

## 2019-01-23 DIAGNOSIS — G4733 Obstructive sleep apnea (adult) (pediatric): Secondary | ICD-10-CM | POA: Diagnosis not present

## 2019-01-26 ENCOUNTER — Telehealth: Payer: Self-pay

## 2019-01-26 NOTE — Telephone Encounter (Signed)
Copied from [place] [id number]. Topic: General - Other >> Jan 26, 2019  2:36 PM Marin Olp L wrote: Reason for CRM: Patient is open to phone visit on 04/02 and prefers not to come in the office. She was wanting to cancel the appointment but wants to know if she can still get her medications if she does cancel? >> Jan 26, 2019  3:35 PM Peace, Tammy L wrote: Jeffie Pollock, Did you already speak with Patient?

## 2019-01-26 NOTE — Telephone Encounter (Signed)
Spoke with pt in regards  

## 2019-01-28 NOTE — Progress Notes (Signed)
Virtual Visit via Video Note  I connected with Angel Williamson on 01/29/19 at  9:00 AM EDT by a video enabled telemedicine application and verified that I am speaking with the correct person using two identifiers.   I discussed the limitations of evaluation and management by telemedicine and the availability of in person appointments. The patient expressed understanding and agreed to proceed.  The patient is currently at home and I am in the office.    No referring provider.    History of Present Illness: She is here for follow up of her chronic medical conditions.   She is exercising regularly - walking.    Hypertension: She is taking her medication daily. She is compliant with a low sodium diet.  She denies chest pain, palpitations, edema, shortness of breath and regular headaches. She does not monitor her blood pressure at home regularly.    Prediabetes:  She is compliant with a low sugar/carbohydrate diet.  She is exercising regularly.  Anxiety: She is taking her ativan as needed only. She denies any side effects from the medication. She feels her anxiety is well controlled with prn medication.  She takes 1/2 of a pill as needed.  She would like to have a refill with everything that is going on now.    OSA:  She is using her cpap nightly.   She denies fever, chills, cough, wheeze, shortness of breath, chest pain, palpitations, edema, headaches and lightheadedness.  She denies any depression.  Overall she feels like she is dealing with the situation okay.  She is getting outside and walking and doing yard work.   Observations/Objective: Appears well in NAD.  Assessment and Plan:   See Problem List for Assessment and Plan of chronic medical problems.   Follow Up Instructions:    I discussed the assessment and treatment plan with the patient. The patient was provided an opportunity to ask questions and all were answered. The patient agreed with the plan and demonstrated an  understanding of the instructions.   The patient was advised to call back or seek an in-person evaluation if the symptoms worsen or if the condition fails to improve as anticipated.     Binnie Rail, MD

## 2019-01-29 ENCOUNTER — Encounter: Payer: Self-pay | Admitting: Internal Medicine

## 2019-01-29 ENCOUNTER — Other Ambulatory Visit: Payer: Self-pay | Admitting: Internal Medicine

## 2019-01-29 ENCOUNTER — Ambulatory Visit (INDEPENDENT_AMBULATORY_CARE_PROVIDER_SITE_OTHER): Payer: 59 | Admitting: Internal Medicine

## 2019-01-29 DIAGNOSIS — I10 Essential (primary) hypertension: Secondary | ICD-10-CM | POA: Diagnosis not present

## 2019-01-29 DIAGNOSIS — F419 Anxiety disorder, unspecified: Secondary | ICD-10-CM | POA: Diagnosis not present

## 2019-01-29 DIAGNOSIS — R7303 Prediabetes: Secondary | ICD-10-CM

## 2019-01-29 MED ORDER — LORAZEPAM 0.5 MG PO TABS: 0.5000 mg | ORAL_TABLET | Freq: Two times a day (BID) | ORAL | 0 refills | Status: DC | PRN

## 2019-01-29 MED ORDER — LOSARTAN POTASSIUM 50 MG PO TABS: 50.0000 mg | ORAL_TABLET | Freq: Every day | ORAL | 3 refills | Status: DC

## 2019-01-29 NOTE — Assessment & Plan Note (Signed)
Low sugar / carb diet Stressed regular exercise Will hold off on blood work until her next visit given stability of a1c

## 2019-01-29 NOTE — Assessment & Plan Note (Signed)
Controlled Continue ativan prn Will refill [place] controlled substance database checked.

## 2019-01-29 NOTE — Assessment & Plan Note (Signed)
BP has been well controlled in office Advised her to monitor it at home Continue current medications Will hold off on blood work until 6 months from now

## 2019-02-04 ENCOUNTER — Telehealth: Payer: Self-pay | Admitting: Internal Medicine

## 2019-02-04 NOTE — Telephone Encounter (Signed)
Patient has not spoke with pharmacy yet and I am showing that they have had the refill since 01/29/19. She is gong to go by and see if they have it.

## 2019-02-04 NOTE — Telephone Encounter (Signed)
Pt called and left a vm stating she contacted her pharmacy about her losartan and they stated they needed to contact the office. Pt is unsure of what is going on and wanting to know why she is unable to get her rx filled.

## 2019-02-04 NOTE — Telephone Encounter (Signed)
Noted  

## 2019-02-05 ENCOUNTER — Other Ambulatory Visit: Payer: Self-pay | Admitting: *Deleted

## 2019-02-05 DIAGNOSIS — I10 Essential (primary) hypertension: Secondary | ICD-10-CM

## 2019-02-05 MED ORDER — LOSARTAN POTASSIUM 50 MG PO TABS: 50.0000 mg | ORAL_TABLET | Freq: Every day | ORAL | 0 refills | Status: DC

## 2019-02-05 MED ORDER — SPIRONOLACTONE 25 MG PO TABS: 25.0000 mg | ORAL_TABLET | Freq: Every day | ORAL | 3 refills | Status: DC

## 2019-02-09 ENCOUNTER — Other Ambulatory Visit: Payer: Self-pay

## 2019-02-09 DIAGNOSIS — I10 Essential (primary) hypertension: Secondary | ICD-10-CM

## 2019-02-23 DIAGNOSIS — G4733 Obstructive sleep apnea (adult) (pediatric): Secondary | ICD-10-CM | POA: Diagnosis not present

## 2019-03-25 DIAGNOSIS — G4733 Obstructive sleep apnea (adult) (pediatric): Secondary | ICD-10-CM | POA: Diagnosis not present

## 2019-04-30 ENCOUNTER — Other Ambulatory Visit: Payer: Self-pay | Admitting: Internal Medicine

## 2019-07-26 ENCOUNTER — Other Ambulatory Visit: Payer: Self-pay | Admitting: Internal Medicine

## 2019-09-11 ENCOUNTER — Ambulatory Visit: Payer: 59 | Admitting: Pulmonary Disease

## 2019-10-04 NOTE — Progress Notes (Signed)
Virtual Visit via Telephone Note  I connected with Angel Williamson on 10/05/19 at  2:30 PM EST by telephone and verified that I am speaking with the correct person using two identifiers.  Location: Patient: Home Provider: [facility] - [id number], [place], [place], [place] [address]   I discussed the limitations, risks, security and privacy concerns of performing an evaluation and management service by telephone and the availability of in person appointments. I also discussed with the patient that there may be a patient responsible charge related to this service. The patient expressed understanding and agreed to proceed.  Patient consented to consult via telephone: Yes People present and their role in pt care: Pt   History of Present Illness:  63 year old female former smoker followed in our office for OSA managed with CPAP   PMH: Hypertension, prediabetic Smoker/ Smoking History: Former Smoker. 12 pack years. Quit 1988.  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  Patient has been doing well.  CPAP compliance report confirms this.  See compliance report listed below:  09/05/2019-10/04/2019-CPAP compliance report-30 out of last 30 days use, all 30 those days greater than 4 hours, average usage 6 hours and 7 minutes, APAP setting 5-15, 95th percentile 11.1, AHI 0.6  Patient admits that sometimes she falls asleep for a few hours before actually putting her CPAP on.  She is wondering if this is okay.  She has had some increased dizziness over the past couple of days.  She denies morning headaches.  Her blood pressure was stable this morning per the patient.  She has not followed up with primary care regarding the dizziness.  Observations/Objective:  10/05/2019 - BP - 129/81  HST 06/04/16 >> AHI 46.8, SaO2 low 72%  Social History    Tobacco Use  Smoking Status Former Smoker  . Packs/day: 1.00  . Years: 12.00  . Pack years: 12.00  . Types: Cigarettes  . Quit date: 10/29/1986  . Years since quitting: 32.9  Smokeless Tobacco Never Used    There is no immunization history on file for this patient.  Assessment and Plan:  OSA (obstructive sleep apnea), Severe Plan:  We will order an overnight oximetry test to further evaluate if you in fact need oxygen with your CPAP use If the overnight oximetry is positive for nocturnal hypoxemia that we will need to order CPAP titration Continue CPAP therapy Renew supplies for 1 year Follow-up in 1 year   Dizziness Plan: We will order overnight oximetry Continue CPAP Utilize CPAP anytime you are sleeping or napping If symptoms persist please contact primary care and obtain baseline blood work as well as further evaluation   Follow Up Instructions:  Return in about 1 year (around 10/04/2020), or if symptoms worsen or fail to improve, for Follow up with Dr. Halford Chessman, Follow up with Wyn Quaker FNP-C.   I discussed the assessment and treatment plan with the patient. The patient was provided an opportunity to ask questions and all were answered. The patient agreed with the plan and demonstrated an understanding of the instructions.   The patient was advised to call back or seek an in-person evaluation if the symptoms worsen or if the condition fails to improve as anticipated.  I provided 24 minutes of non-face-to-face time during this encounter.   Lauraine Rinne, NP

## 2019-10-05 ENCOUNTER — Ambulatory Visit (INDEPENDENT_AMBULATORY_CARE_PROVIDER_SITE_OTHER): Payer: 59 | Admitting: Pulmonary Disease

## 2019-10-05 ENCOUNTER — Encounter: Payer: Self-pay | Admitting: Pulmonary Disease

## 2019-10-05 ENCOUNTER — Other Ambulatory Visit: Payer: Self-pay

## 2019-10-05 DIAGNOSIS — G4733 Obstructive sleep apnea (adult) (pediatric): Secondary | ICD-10-CM | POA: Diagnosis not present

## 2019-10-05 DIAGNOSIS — R42 Dizziness and giddiness: Secondary | ICD-10-CM

## 2019-10-05 NOTE — Assessment & Plan Note (Signed)
Plan:  We will order an overnight oximetry test to further evaluate if you in fact need oxygen with your CPAP use If the overnight oximetry is positive for nocturnal hypoxemia that we will need to order CPAP titration Continue CPAP therapy Renew supplies for 1 year Follow-up in 1 year

## 2019-10-05 NOTE — Patient Instructions (Addendum)
You were seen today by Lauraine Rinne, NP  for:   1. OSA (obstructive sleep apnea), Severe  - Pulse oximetry, overnight; Future  We recommend that you continue using your CPAP daily >>>Keep up the hard work using your device >>> Goal should be wearing this for the entire night that you are sleeping, at least 4 to 6 hours  Remember:  . Do not drive or operate heavy machinery if tired or drowsy.  . Please notify the supply company and office if you are unable to use your device regularly due to missing supplies or machine being broken.  . Work on maintaining a healthy weight and following your recommended nutrition plan  . Maintain proper daily exercise and movement  . Maintaining proper use of your device can also help improve management of other chronic illnesses such as: Blood pressure, blood sugars, and weight management.   BiPAP/ CPAP Cleaning:  >>>Clean weekly, with Dawn soap, and bottle brush.  Set up to air dry. >>> Wipe mask out daily with wet wipe or towelette   2. Dizziness  Order overnight oximetry to further evaluate oxygen needs with CPAP use If positive for nocturnal hypoxemia on overnight oximetry then will need to order CPAP titration  Please contact your office as soon as you complete your overnight oximetry test  If symptoms persist please contact primary care Dr. Quay Burow obtain further evaluation as well as likely blood work   We recommend today:  Orders Placed This Encounter  Procedures  . Pulse oximetry, overnight    Standing Status:   Future    Standing Expiration Date:   10/04/2020    Scheduling Instructions:     On RA with CPAP   Orders Placed This Encounter  Procedures  . Pulse oximetry, overnight   No orders of the defined types were placed in this encounter.   Follow Up:    Return in about 1 year (around 10/04/2020), or if symptoms worsen or fail to improve, for Follow up with Dr. Halford Chessman, Follow up with Wyn Quaker FNP-C.   Please do your part to  reduce the spread of COVID-19:      Reduce your risk of any infection  and COVID19 by using the similar precautions used for avoiding the common cold or flu:  Marland Kitchen Wash your hands often with soap and warm water for at least 20 seconds.  If soap and water are not readily available, use an alcohol-based hand sanitizer with at least 60% alcohol.  . If coughing or sneezing, cover your mouth and nose by coughing or sneezing into the elbow areas of your shirt or coat, into a tissue or into your sleeve (not your hands). Langley Gauss A MASK when in public  . Avoid shaking hands with others and consider head nods or verbal greetings only. . Avoid touching your eyes, nose, or mouth with unwashed hands.  . Avoid close contact with people who are sick. . Avoid places or events with large numbers of people in one location, like concerts or sporting events. . If you have some symptoms but not all symptoms, continue to monitor at home and seek medical attention if your symptoms worsen. . If you are having a medical emergency, call 911.   [place] / e-Visit: [email or web address]         [facility]: [facility]: [phone]  [facility]: [phone]     It is flu season:   >>> Best ways to protect herself from the flu: Receive the yearly flu vaccine, practice good hand hygiene washing with soap and also using hand sanitizer when available, eat a nutritious meals, get adequate rest, hydrate appropriately   Please contact the office if your symptoms worsen or you have concerns that you are not improving.   Thank you for choosing [facility] for your healthcare, and for allowing [place] to partner with you on your healthcare journey. I am thankful to be able to provide care to you today.   Wyn Quaker FNP-C

## 2019-10-05 NOTE — Assessment & Plan Note (Signed)
Plan: We will order overnight oximetry Continue CPAP Utilize CPAP anytime you are sleeping or napping If symptoms persist please contact primary care and obtain baseline blood work as well as further evaluation

## 2019-10-05 NOTE — Addendum Note (Signed)
Addended by: Valerie Salts on: 10/05/2019 02:56 PM   Modules accepted: Orders

## 2019-10-05 NOTE — Progress Notes (Signed)
Reviewed and agree with assessment/plan.   Naphtali Zywicki, MD  Pulmonary/Critical Care 10/24/2016, 12:24 PM Pager:  [phone]  

## 2019-10-15 ENCOUNTER — Ambulatory Visit (INDEPENDENT_AMBULATORY_CARE_PROVIDER_SITE_OTHER): Payer: 59 | Admitting: Internal Medicine

## 2019-10-15 ENCOUNTER — Encounter: Payer: Self-pay | Admitting: Internal Medicine

## 2019-10-15 ENCOUNTER — Other Ambulatory Visit (INDEPENDENT_AMBULATORY_CARE_PROVIDER_SITE_OTHER): Payer: 59

## 2019-10-15 ENCOUNTER — Other Ambulatory Visit: Payer: Self-pay

## 2019-10-15 VITALS — BP 134/86 | HR 86 | Temp 97.9°F | Ht 63.0 in | Wt 186.0 lb

## 2019-10-15 DIAGNOSIS — E611 Iron deficiency: Secondary | ICD-10-CM | POA: Diagnosis not present

## 2019-10-15 DIAGNOSIS — F419 Anxiety disorder, unspecified: Secondary | ICD-10-CM

## 2019-10-15 DIAGNOSIS — E538 Deficiency of other specified B group vitamins: Secondary | ICD-10-CM | POA: Diagnosis not present

## 2019-10-15 DIAGNOSIS — R42 Dizziness and giddiness: Secondary | ICD-10-CM | POA: Insufficient documentation

## 2019-10-15 DIAGNOSIS — E559 Vitamin D deficiency, unspecified: Secondary | ICD-10-CM

## 2019-10-15 DIAGNOSIS — I10 Essential (primary) hypertension: Secondary | ICD-10-CM

## 2019-10-15 DIAGNOSIS — R7303 Prediabetes: Secondary | ICD-10-CM | POA: Diagnosis not present

## 2019-10-15 LAB — CBC WITH DIFFERENTIAL/PLATELET
Basophils Absolute: 0 10*3/uL (ref 0.0–0.1)
Basophils Relative: 0.5 % (ref 0.0–3.0)
Eosinophils Absolute: 0.1 10*3/uL (ref 0.0–0.7)
Eosinophils Relative: 0.6 % (ref 0.0–5.0)
HCT: 38.4 % (ref 36.0–46.0)
Hemoglobin: 12.8 g/dL (ref 12.0–15.0)
Lymphocytes Relative: 21.3 % (ref 12.0–46.0)
Lymphs Abs: 1.9 10*3/uL (ref 0.7–4.0)
MCHC: 33.2 g/dL (ref 30.0–36.0)
MCV: 91.6 fl (ref 78.0–100.0)
Monocytes Absolute: 0.4 10*3/uL (ref 0.1–1.0)
Monocytes Relative: 5 % (ref 3.0–12.0)
Neutro Abs: 6.3 10*3/uL (ref 1.4–7.7)
Neutrophils Relative %: 72.6 % (ref 43.0–77.0)
Platelets: 310 10*3/uL (ref 150.0–400.0)
RBC: 4.2 Mil/uL (ref 3.87–5.11)
RDW: 13 % (ref 11.5–15.5)
WBC: 8.7 10*3/uL (ref 4.0–10.5)

## 2019-10-15 LAB — TSH: TSH: 1.09 u[IU]/mL (ref 0.35–4.50)

## 2019-10-15 LAB — HEPATIC FUNCTION PANEL
ALT: 16 U/L (ref 0–35)
AST: 16 U/L (ref 0–37)
Albumin: 4.7 g/dL (ref 3.5–5.2)
Alkaline Phosphatase: 83 U/L (ref 39–117)
Bilirubin, Direct: 0.1 mg/dL (ref 0.0–0.3)
Total Bilirubin: 0.5 mg/dL (ref 0.2–1.2)
Total Protein: 7.9 g/dL (ref 6.0–8.3)

## 2019-10-15 LAB — VITAMIN B12: Vitamin B-12: 293 pg/mL (ref 211–911)

## 2019-10-15 LAB — IBC PANEL
Iron: 54 ug/dL (ref 42–145)
Saturation Ratios: 14.7 % — ABNORMAL LOW (ref 20.0–50.0)
Transferrin: 262 mg/dL (ref 212.0–360.0)

## 2019-10-15 LAB — URINALYSIS, ROUTINE W REFLEX MICROSCOPIC
Bilirubin Urine: NEGATIVE
Ketones, ur: NEGATIVE
Leukocytes,Ua: NEGATIVE
Nitrite: NEGATIVE
Specific Gravity, Urine: 1.01 (ref 1.000–1.030)
Total Protein, Urine: NEGATIVE
Urine Glucose: NEGATIVE
Urobilinogen, UA: 0.2 (ref 0.0–1.0)
WBC, UA: NONE SEEN (ref 0–?)
pH: 6 (ref 5.0–8.0)

## 2019-10-15 LAB — BASIC METABOLIC PANEL
BUN: 18 mg/dL (ref 6–23)
CO2: 27 mEq/L (ref 19–32)
Calcium: 10 mg/dL (ref 8.4–10.5)
Chloride: 104 mEq/L (ref 96–112)
Creatinine, Ser: 1.01 mg/dL (ref 0.40–1.20)
GFR: 55.23 mL/min — ABNORMAL LOW (ref >60.00)
Glucose, Bld: 107 mg/dL — ABNORMAL HIGH (ref 70–99)
Potassium: 4.2 mEq/L (ref 3.5–5.1)
Sodium: 139 mEq/L (ref 135–145)

## 2019-10-15 LAB — VITAMIN D 25 HYDROXY (VIT D DEFICIENCY, FRACTURES): VITD: 27.68 ng/mL — ABNORMAL LOW (ref 30.00–100.00)

## 2019-10-15 LAB — HEMOGLOBIN A1C: Hgb A1c MFr Bld: 5.9 % (ref 4.6–6.5)

## 2019-10-15 NOTE — Progress Notes (Signed)
Subjective:    Patient ID: Angel Williamson, female    DOB: 04/03/1956, 63 y.o.   MRN: [id number]  HPI   Here with 2 wks onset lightheaded random off and on, mild, seemed to start after a brief vertigo with waking up from CPAP, but this dizziness /lightheaded is different.  Has sen pulmonary who per pt suggested wearing CPAP to start with going to sleep, and ordered what sounds like overnight oximetry.  Pt denies chest pain, increased sob or doe, wheezing, orthopnea, PND, increased LE swelling, palpitations, or syncope.  Has been trying to limit her sugar since her last a1c was about 6.1, felt better but no effect on lightheaded. Denies worsening reflux, abd pain, dysphagia, n/v, or blood, but maybe some constipation.  No ned changes including BP meds.  Has bee somewhat more nervous lately, has lorazepam prn, but not really using any more frequently lately.  Has some holiday stress as well. No overt bleeding or fever.  Past Medical History:  Diagnosis Date  . Breast fibrocystic disorder   . Calcaneus fracture 2010   no surgery  . History of colonic polyps    adenomatous  . Hypertension   . Obesity   . OSA (obstructive sleep apnea)   . Osteopenia    Past Surgical History:  Procedure Laterality Date  . BREAST CYST ASPIRATION    . COLONOSCOPY  2014   negative  . COLONOSCOPY W/ POLYPECTOMY     adenomatous polyps 2003 & 2007 Due 2012. Dr.Kaplan   . G 2 P 2    . no prior surgery      reports that she quit smoking about 32 years ago. Her smoking use included cigarettes. She has a 12.00 pack-year smoking history. She has never used smokeless tobacco. She reports that she does not drink alcohol or use drugs. family history includes Anemia in her mother and another family member; Cancer in her paternal grandmother; Colon polyps in her father; Diabetes in her maternal grandfather; Stomach cancer in her paternal grandmother; Stroke (age of onset: 9) in her paternal grandfather. Allergies  Allergen  Reactions  . Hydrochlorothiazide     REACTION: low potassium  . Shrimp [Shellfish Allergy]   . Benazepril Hcl     REACTION: cough   Current Outpatient Medications on File Prior to Visit  Medication Sig Dispense Refill  . bisacodyl (DULCOLAX) 10 MG suppository Place 1 suppository (10 mg total) rectally as needed for moderate constipation. 12 suppository 0  . Calcium Carb-Cholecalciferol (CALCIUM 1000 + D PO) Take by mouth daily.    . cholecalciferol (VITAMIN D) 1000 UNITS tablet Take 1,000 Units by mouth daily.    . fluticasone (FLONASE) 50 MCG/ACT nasal spray USE 1 SPRAY IN EACH NOSTRIL TWICE A DAY AS NEEDED 16 g 5  . LORazepam (ATIVAN) 0.5 MG tablet Take 1 tablet (0.5 mg total) by mouth 2 (two) times daily as needed for anxiety. 30 tablet 0  . losartan (COZAAR) 50 MG tablet TAKE 1 TABLET BY MOUTH EVERY DAY 90 tablet 0  . spironolactone (ALDACTONE) 25 MG tablet Take 1 tablet (25 mg total) by mouth daily. 90 tablet 3   No current facility-administered medications on file prior to visit.   Review of Systems  Constitutional: Negative for other unusual diaphoresis or sweats HENT: Negative for ear discharge or swelling Eyes: Negative for other worsening visual disturbances Respiratory: Negative for stridor or other swelling  Gastrointestinal: Negative for worsening distension or other blood Genitourinary: Negative for retention  or other urinary change Musculoskeletal: Negative for other MSK pain or swelling Skin: Negative for color change or other new lesions Neurological: Negative for worsening tremors and other numbness  Psychiatric/Behavioral: Negative for worsening agitation or other fatigue All otherwise neg per pt     Objective:   Physical Exam BP 134/86   Pulse 86   Temp 97.9 F (36.6 C) (Oral)   Ht 5\' 3"  (1.6 m)   Wt 186 lb (84.4 kg)   SpO2 96%   BMI 32.95 kg/m  VS noted,  Constitutional: Pt appears in NAD HENT: Head: NCAT.  Right Ear: External ear normal.  Left  Ear: External ear normal.  Eyes: . Pupils are equal, round, and reactive to light. Conjunctivae and EOM are normal Nose: without d/c or deformity Neck: Neck supple. Gross normal ROM Cardiovascular: Normal rate and regular rhythm.   Pulmonary/Chest: Effort normal and breath sounds without rales or wheezing.  Abd:  Soft, NT, ND, + BS, no organomegaly Neurological: Pt is alert. At baseline orientation, motor grossly intact Skin: Skin is warm. No rashes, other new lesions, no LE edema Psychiatric: Pt behavior is normal without agitation  All otherwise neg per pt Lab Results  Component Value Date   WBC 8.7 10/15/2019   HGB 12.8 10/15/2019   HCT 38.4 10/15/2019   PLT 310.0 10/15/2019   GLUCOSE 107 (H) 10/15/2019   CHOL 128 07/28/2018   TRIG 113.0 07/28/2018   HDL 41.90 07/28/2018   LDLCALC 64 07/28/2018   ALT 16 10/15/2019   AST 16 10/15/2019   NA 139 10/15/2019   K 4.2 10/15/2019   CL 104 10/15/2019   CREATININE 1.01 10/15/2019   BUN 18 10/15/2019   CO2 27 10/15/2019   TSH 1.09 10/15/2019   HGBA1C 5.9 10/15/2019       Assessment & Plan:

## 2019-10-15 NOTE — Assessment & Plan Note (Addendum)
Etiology unclear, for labs today as ordered,  Consider imaging, cns, to f/u any worsening symptoms or concerns  Note:  Total time for pt hx, exam, review of record with pt in the room, determination of diagnoses and plan for further eval and tx is > 40 min, with over 50% spent in coordination and counseling of patient including the differential dx, tx, further evaluation and other management of lightheadedness, HTn, DM, anxiety

## 2019-10-15 NOTE — Assessment & Plan Note (Signed)
stable overall by history and exam, recent data reviewed with pt, and pt to continue medical treatment as before,  to f/u any worsening symptoms or concerns  

## 2019-10-15 NOTE — Patient Instructions (Signed)
Please continue all other medications as before, and refills have been done if requested.  Please have the pharmacy call with any other refills you may need.  Please go to the LAB in the Basement (turn left off the elevator) for the tests to be done today  You will be contacted by phone if any changes need to be made immediately.  Otherwise, you will receive a letter about your results with an explanation, but please check with MyChart first.  Please remember to sign up for MyChart if you have not done so, as this will be important to you in the future with finding out test results, communicating by private email, and scheduling acute appointments online when needed.

## 2019-10-19 ENCOUNTER — Other Ambulatory Visit: Payer: Self-pay | Admitting: Internal Medicine

## 2019-11-09 ENCOUNTER — Telehealth: Payer: Self-pay | Admitting: Pulmonary Disease

## 2019-11-09 NOTE — Telephone Encounter (Signed)
11/09/2019 1437  11/04/2019-overnight oximetry-overnight oximetry on CPAP, room air-duration of sleep 6 hours and 9 minutes, time spent below 88% 0.8 minutes  Patient does not require oxygen at night.  Continue CPAP therapy.  Keep follow-up with our office.  Wyn Quaker, FNP

## 2019-11-09 NOTE — Telephone Encounter (Signed)
Spoke with patient. She verbalized understanding about results.   Nothing further needed at time of call.

## 2020-01-09 NOTE — Progress Notes (Signed)
Subjective:    Patient ID: Angel Williamson, female    DOB: Mar 03, 1956, 65 y.o.   MRN: [id number]  HPI She is here for a physical exam.   She had dizziness in December and came here.  The dizziness lasted about 2 weeks.  It was intermittent.  She had blood work done at that time and everything looked normal.  She has not had any recurrences.   She denies any other changes in her history and has no concerns   Medications and allergies reviewed with patient and updated if appropriate.  Patient Active Problem List   Diagnosis Date Noted  . Intermittent lightheadedness 10/15/2019  . Dizziness 10/05/2019  . Constipation 10/10/2018  . Right-sided low back pain without sciatica 09/23/2018  . Rectal discharge 09/23/2018  . Anxiety 07/22/2017  . OSA (obstructive sleep apnea), Severe 08/22/2016  . Prediabetes 01/11/2016  . Obesity 01/11/2016  . Skin cancer, basal cell 04/18/2012  . COLONIC POLYPS, HX OF 12/13/2009  . Osteopenia 03/29/2009  . Essential hypertension 11/14/2007    Current Outpatient Medications on File Prior to Visit  Medication Sig Dispense Refill  . bisacodyl (DULCOLAX) 10 MG suppository Place 1 suppository (10 mg total) rectally as needed for moderate constipation. 12 suppository 0  . Calcium Carb-Cholecalciferol (CALCIUM 1000 + D PO) Take by mouth daily.    . cholecalciferol (VITAMIN D) 1000 UNITS tablet Take 1,000 Units by mouth daily.    . fluticasone (FLONASE) 50 MCG/ACT nasal spray USE 1 SPRAY IN EACH NOSTRIL TWICE A DAY AS NEEDED 16 g 5  . LORazepam (ATIVAN) 0.5 MG tablet Take 1 tablet (0.5 mg total) by mouth 2 (two) times daily as needed for anxiety. 30 tablet 0  . losartan (COZAAR) 50 MG tablet Take 1 tablet (50 mg total) by mouth daily. Need office visit for more refills. 90 tablet 0  . spironolactone (ALDACTONE) 25 MG tablet Take 1 tablet (25 mg total) by mouth daily. 90 tablet 3   No current facility-administered medications on file prior to visit.     Past Medical History:  Diagnosis Date  . Breast fibrocystic disorder   . Calcaneus fracture 2010   no surgery  . History of colonic polyps    adenomatous  . Hypertension   . Obesity   . OSA (obstructive sleep apnea)   . Osteopenia     Past Surgical History:  Procedure Laterality Date  . BREAST CYST ASPIRATION    . COLONOSCOPY  2014   negative  . COLONOSCOPY W/ POLYPECTOMY     adenomatous polyps 2003 & 2007 Due 2012. Dr.Kaplan   . G 2 P 2    . no prior surgery      Social History   Socioeconomic History  . Marital status: Married    Spouse name: Not on file  . Number of children: Not on file  . Years of education: Not on file  . Highest education level: Not on file  Occupational History  . Occupation: Surveyor, minerals  Tobacco Use  . Smoking status: Former Smoker    Packs/day: 1.00    Years: 12.00    Pack years: 12.00    Types: Cigarettes    Quit date: 10/29/1986    Years since quitting: 33.2  . Smokeless tobacco: Never Used  Substance and Sexual Activity  . Alcohol use: No  . Drug use: No  . Sexual activity: Not on file  Other Topics Concern  . Not on file  Social History  Narrative  . Not on file   Social Determinants of Health   Financial Resource Strain:   . Difficulty of Paying Living Expenses:   Food Insecurity:   . Worried About Charity fundraiser in the Last Year:   . Arboriculturist in the Last Year:   Transportation Needs:   . Film/video editor (Medical):   Marland Kitchen Lack of Transportation (Non-Medical):   Physical Activity:   . Days of Exercise per Week:   . Minutes of Exercise per Session:   Stress:   . Feeling of Stress :   Social Connections:   . Frequency of Communication with Friends and Family:   . Frequency of Social Gatherings with Friends and Family:   . Attends Religious Services:   . Active Member of Clubs or Organizations:   . Attends Archivist Meetings:   Marland Kitchen Marital Status:     Family History  Problem  Relation Age of Onset  . Cancer Paternal Grandmother        stomach  . Stomach cancer Paternal Grandmother   . Diabetes Maternal Grandfather   . Stroke Paternal Grandfather 55  . Colon polyps Father   . Anemia Mother   . Anemia Other        Cousin   . Colon cancer Neg Hx   . Esophageal cancer Neg Hx   . Rectal cancer Neg Hx     Review of Systems  Constitutional: Negative for chills and fever.  Eyes: Negative for visual disturbance.  Respiratory: Negative for cough, shortness of breath and wheezing.   Cardiovascular: Negative for chest pain, palpitations and leg swelling.  Gastrointestinal: Negative for abdominal pain, blood in stool, constipation, diarrhea and nausea.       No gerd  Genitourinary: Negative for dysuria and hematuria.  Musculoskeletal: Negative for arthralgias and back pain.  Skin: Negative for color change and rash.  Neurological: Positive for dizziness (once). Negative for light-headedness and headaches.  Psychiatric/Behavioral: Negative for dysphoric mood. The patient is nervous/anxious.        Objective:   Vitals:   01/11/20 1411  BP: 118/72  Pulse: 62  Resp: 16  Temp: 98.2 F (36.8 C)  SpO2: 99%   Filed Weights   01/11/20 1411  Weight: 192 lb (87.1 kg)   Body mass index is 34.01 kg/m.  BP Readings from Last 3 Encounters:  01/11/20 118/72  10/15/19 134/86  10/10/18 132/78    Wt Readings from Last 3 Encounters:  01/11/20 192 lb (87.1 kg)  10/15/19 186 lb (84.4 kg)  10/10/18 190 lb (86.2 kg)     Physical Exam Constitutional: She appears well-developed and well-nourished. No distress.  HENT:  Head: Normocephalic and atraumatic.  Right Ear: External ear normal. Normal ear canal and TM Left Ear: External ear normal.  Normal ear canal and TM Mouth/Throat: Oropharynx is clear and moist.  Eyes: Conjunctivae and EOM are normal.  Neck: Neck supple. No tracheal deviation present. No thyromegaly present.  No carotid bruit  Cardiovascular:  Normal rate, regular rhythm and normal heart sounds.   No murmur heard.  No edema. Pulmonary/Chest: Effort normal and breath sounds normal. No respiratory distress. She has no wheezes. She has no rales.  Breast: deferred   Abdominal: Soft. She exhibits no distension. There is no tenderness.  Lymphadenopathy: She has no cervical adenopathy.  Skin: Skin is warm and dry. She is not diaphoretic.  Psychiatric: She has a normal mood and affect. Her behavior is  normal.        Assessment & Plan:   Physical exam: Screening blood work    ordered Immunizations  Deferred td, flu, discussed shingrix Colonoscopy  Up to date  Mammogram  Up to date  Gyn  Up to date  Dexa  Due - breast center-she deferred Eye exams  Due - will schedule  Exercise   walking Weight  Advised weight loss Substance abuse  none  See Problem List for Assessment and Plan of chronic medical problems.     This visit occurred during the SARS-CoV-2 public health emergency.  Safety protocols were in place, including screening questions prior to the visit, additional usage of staff PPE, and extensive cleaning of exam room while observing appropriate contact time as indicated for disinfecting solutions.

## 2020-01-09 NOTE — Patient Instructions (Addendum)
All other Health Maintenance issues reviewed.   All recommended immunizations and age-appropriate screenings are up-to-date or discussed.  No immunization administered today.   Medications reviewed and updated.  Changes include :   Start B12 1000 mcg daily and vitamin D 1000-2000 units daily.    Your prescription(s) have been submitted to your pharmacy. Please take as directed and contact our office if you believe you are having problem(s) with the medication(s).    Please followup in 1 year    Health Maintenance, Female Adopting a healthy lifestyle and getting preventive care are important in promoting health and wellness. Ask your health care provider about:  The right schedule for you to have regular tests and exams.  Things you can do on your own to prevent diseases and keep yourself healthy. What should I know about diet, weight, and exercise? Eat a healthy diet   Eat a diet that includes plenty of vegetables, fruits, low-fat dairy products, and lean protein.  Do not eat a lot of foods that are high in solid fats, added sugars, or sodium. Maintain a healthy weight Body mass index (BMI) is used to identify weight problems. It estimates body fat based on height and weight. Your health care provider can help determine your BMI and help you achieve or maintain a healthy weight. Get regular exercise Get regular exercise. This is one of the most important things you can do for your health. Most adults should:  Exercise for at least 150 minutes each week. The exercise should increase your heart rate and make you sweat (moderate-intensity exercise).  Do strengthening exercises at least twice a week. This is in addition to the moderate-intensity exercise.  Spend less time sitting. Even light physical activity can be beneficial. Watch cholesterol and blood lipids Have your blood tested for lipids and cholesterol at 64 years of age, then have this test every 5 years. Have your  cholesterol levels checked more often if:  Your lipid or cholesterol levels are high.  You are older than 64 years of age.  You are at high risk for heart disease. What should I know about cancer screening? Depending on your health history and family history, you may need to have cancer screening at various ages. This may include screening for:  Breast cancer.  Cervical cancer.  Colorectal cancer.  Skin cancer.  Lung cancer. What should I know about heart disease, diabetes, and high blood pressure? Blood pressure and heart disease  High blood pressure causes heart disease and increases the risk of stroke. This is more likely to develop in people who have high blood pressure readings, are of African descent, or are overweight.  Have your blood pressure checked: ? Every 3-5 years if you are 52-36 years of age. ? Every year if you are 90 years old or older. Diabetes Have regular diabetes screenings. This checks your fasting blood sugar level. Have the screening done:  Once every three years after age 72 if you are at a normal weight and have a low risk for diabetes.  More often and at a younger age if you are overweight or have a high risk for diabetes. What should I know about preventing infection? Hepatitis B If you have a higher risk for hepatitis B, you should be screened for this virus. Talk with your health care provider to find out if you are at risk for hepatitis B infection. Hepatitis C Testing is recommended for:  Everyone born from [birth year] through 1965.  Anyone with  known risk factors for hepatitis C. Sexually transmitted infections (STIs)  Get screened for STIs, including gonorrhea and chlamydia, if: ? You are sexually active and are younger than 64 years of age. ? You are older than 64 years of age and your health care provider tells you that you are at risk for this type of infection. ? Your sexual activity has changed since you were last screened, and you are  at increased risk for chlamydia or gonorrhea. Ask your health care provider if you are at risk.  Ask your health care provider about whether you are at high risk for HIV. Your health care provider may recommend a prescription medicine to help prevent HIV infection. If you choose to take medicine to prevent HIV, you should first get tested for HIV. You should then be tested every 3 months for as long as you are taking the medicine. Pregnancy  If you are about to stop having your period (premenopausal) and you may become pregnant, seek counseling before you get pregnant.  Take 400 to 800 micrograms (mcg) of folic acid every day if you become pregnant.  Ask for birth control (contraception) if you want to prevent pregnancy. Osteoporosis and menopause Osteoporosis is a disease in which the bones lose minerals and strength with aging. This can result in bone fractures. If you are 54 years old or older, or if you are at risk for osteoporosis and fractures, ask your health care provider if you should:  Be screened for bone loss.  Take a calcium or vitamin D supplement to lower your risk of fractures.  Be given hormone replacement therapy (HRT) to treat symptoms of menopause. Follow these instructions at home: Lifestyle  Do not use any products that contain nicotine or tobacco, such as cigarettes, e-cigarettes, and chewing tobacco. If you need help quitting, ask your health care provider.  Do not use street drugs.  Do not share needles.  Ask your health care provider for help if you need support or information about quitting drugs. Alcohol use  Do not drink alcohol if: ? Your health care provider tells you not to drink. ? You are pregnant, may be pregnant, or are planning to become pregnant.  If you drink alcohol: ? Limit how much you use to 0-1 drink a day. ? Limit intake if you are breastfeeding.  Be aware of how much alcohol is in your drink. In the [place] one drink equals one 12 oz  bottle of beer (355 mL), one 5 oz glass of wine (148 mL), or one 1 oz glass of hard liquor (44 mL). General instructions  Schedule regular health, dental, and eye exams.  Stay current with your vaccines.  Tell your health care provider if: ? You often feel depressed. ? You have ever been abused or do not feel safe at home. Summary  Adopting a healthy lifestyle and getting preventive care are important in promoting health and wellness.  Follow your health care provider's instructions about healthy diet, exercising, and getting tested or screened for diseases.  Follow your health care provider's instructions on monitoring your cholesterol and blood pressure. This information is not intended to replace advice given to you by your health care provider. Make sure you discuss any questions you have with your health care provider. Document Revised: 10/08/2018 Document Reviewed: 10/08/2018 Elsevier Patient Education  2020 [employer].

## 2020-01-11 ENCOUNTER — Other Ambulatory Visit: Payer: Self-pay

## 2020-01-11 ENCOUNTER — Ambulatory Visit (INDEPENDENT_AMBULATORY_CARE_PROVIDER_SITE_OTHER): Payer: 59 | Admitting: Internal Medicine

## 2020-01-11 ENCOUNTER — Encounter: Payer: Self-pay | Admitting: Internal Medicine

## 2020-01-11 VITALS — BP 118/72 | HR 62 | Temp 98.2°F | Resp 16 | Ht 63.0 in | Wt 192.0 lb

## 2020-01-11 DIAGNOSIS — M85859 Other specified disorders of bone density and structure, unspecified thigh: Secondary | ICD-10-CM

## 2020-01-11 DIAGNOSIS — C4491 Basal cell carcinoma of skin, unspecified: Secondary | ICD-10-CM

## 2020-01-11 DIAGNOSIS — F419 Anxiety disorder, unspecified: Secondary | ICD-10-CM | POA: Diagnosis not present

## 2020-01-11 DIAGNOSIS — R7303 Prediabetes: Secondary | ICD-10-CM

## 2020-01-11 DIAGNOSIS — I10 Essential (primary) hypertension: Secondary | ICD-10-CM | POA: Diagnosis not present

## 2020-01-11 DIAGNOSIS — Z0000 Encounter for general adult medical examination without abnormal findings: Secondary | ICD-10-CM | POA: Diagnosis not present

## 2020-01-11 DIAGNOSIS — E6609 Other obesity due to excess calories: Secondary | ICD-10-CM

## 2020-01-11 DIAGNOSIS — E66811 Obesity, class 1: Secondary | ICD-10-CM

## 2020-01-11 DIAGNOSIS — Z6834 Body mass index (BMI) 34.0-34.9, adult: Secondary | ICD-10-CM

## 2020-01-11 MED ORDER — LOSARTAN POTASSIUM 50 MG PO TABS: 50.0000 mg | ORAL_TABLET | Freq: Every day | ORAL | 1 refills | Status: DC

## 2020-01-11 MED ORDER — LORAZEPAM 0.5 MG PO TABS: 0.5000 mg | ORAL_TABLET | Freq: Two times a day (BID) | ORAL | 0 refills | Status: DC | PRN

## 2020-01-11 MED ORDER — SPIRONOLACTONE 25 MG PO TABS: 25.0000 mg | ORAL_TABLET | Freq: Every day | ORAL | 1 refills | Status: DC

## 2020-01-11 NOTE — Assessment & Plan Note (Signed)
Chronic Check a1c Low sugar / carb diet Stressed regular exercise  

## 2020-01-11 NOTE — Assessment & Plan Note (Signed)
Chronic With hypertension, prediabetes She is walking-advised increasing exercise Decrease portions, healthy diet She does want to lose weight

## 2020-01-11 NOTE — Assessment & Plan Note (Signed)
Chronic DEXA due-declined for this year, but will get next year Stressed regular exercise-she is walking Taking calcium and vitamin D

## 2020-01-11 NOTE — Assessment & Plan Note (Signed)
Chronic BP well controlled Current regimen effective and well tolerated Continue current medications at current doses cmp  

## 2020-01-11 NOTE — Assessment & Plan Note (Signed)
Chronic, intermittent Takes Ativan only as needed does not take often Medication is effective We will continue

## 2020-01-11 NOTE — Assessment & Plan Note (Signed)
Sees dermatology annually, but did not see them last year She will go ahead and schedule an appointment

## 2020-06-26 NOTE — Progress Notes (Signed)
Subjective:    Patient ID: Angel Williamson, female    DOB: [birth year], 64 y.o.   MRN: [id number]  HPI The patient is here for an acute visit.   Her left hip up to her mid back was hurting three weeks ago after getting up and down out of a high truck.  The pain improved except in her left kidney area.  This pain is persisted.  It is intermittent.  Certain ways she turns it is worse.  Worse in morning.  Right now sitting here she has no pain.  Sometimes sitting for a long time period makes it worse.  It seems to be getting better.  Aspirin helps.    No other symptoms.  She denies any urinary symptoms, except increase in frequency, but she is trying to drink a lot and thinks it could be related to that.  Medications and allergies reviewed with patient and updated if appropriate.  Patient Active Problem List   Diagnosis Date Noted  . Intermittent lightheadedness 10/15/2019  . Dizziness 10/05/2019  . Constipation 10/10/2018  . Right-sided low back pain without sciatica 09/23/2018  . Rectal discharge 09/23/2018  . Anxiety 07/22/2017  . OSA (obstructive sleep apnea), Severe 08/22/2016  . Prediabetes 01/11/2016  . Obesity 01/11/2016  . Skin cancer, basal cell 04/18/2012  . COLONIC POLYPS, HX OF 12/13/2009  . Osteopenia 03/29/2009  . Essential hypertension 11/14/2007    Current Outpatient Medications on File Prior to Visit  Medication Sig Dispense Refill  . bisacodyl (DULCOLAX) 10 MG suppository Place 1 suppository (10 mg total) rectally as needed for moderate constipation. 12 suppository 0  . Calcium Carb-Cholecalciferol (CALCIUM 1000 + D PO) Take by mouth daily.    . cholecalciferol (VITAMIN D) 1000 UNITS tablet Take 1,000 Units by mouth daily.    . fluticasone (FLONASE) 50 MCG/ACT nasal spray USE 1 SPRAY IN EACH NOSTRIL TWICE A DAY AS NEEDED 16 g 5  . LORazepam (ATIVAN) 0.5 MG tablet Take 1 tablet (0.5 mg total) by mouth 2 (two) times daily as needed for anxiety. 30 tablet 0  .  losartan (COZAAR) 50 MG tablet Take 1 tablet (50 mg total) by mouth daily. 90 tablet 1  . spironolactone (ALDACTONE) 25 MG tablet Take 1 tablet (25 mg total) by mouth daily. 90 tablet 1   No current facility-administered medications on file prior to visit.    Past Medical History:  Diagnosis Date  . Breast fibrocystic disorder   . Calcaneus fracture 2010   no surgery  . History of colonic polyps    adenomatous  . Hypertension   . Obesity   . OSA (obstructive sleep apnea)   . Osteopenia     Past Surgical History:  Procedure Laterality Date  . BREAST CYST ASPIRATION    . COLONOSCOPY  2014   negative  . COLONOSCOPY W/ POLYPECTOMY     adenomatous polyps 2003 & 2007 Due 2012. Dr.Kaplan   . G 2 P 2    . no prior surgery      Social History   Socioeconomic History  . Marital status: Married    Spouse name: Not on file  . Number of children: Not on file  . Years of education: Not on file  . Highest education level: Not on file  Occupational History  . Occupation: Surveyor, minerals  Tobacco Use  . Smoking status: Former Smoker    Packs/day: 1.00    Years: 12.00    Pack years: 12.00  Types: Cigarettes    Quit date: 10/29/1986    Years since quitting: 33.6  . Smokeless tobacco: Never Used  Vaping Use  . Vaping Use: Never used  Substance and Sexual Activity  . Alcohol use: No  . Drug use: No  . Sexual activity: Not on file  Other Topics Concern  . Not on file  Social History Narrative  . Not on file   Social Determinants of Health   Financial Resource Strain:   . Difficulty of Paying Living Expenses: Not on file  Food Insecurity:   . Worried About Charity fundraiser in the Last Year: Not on file  . Ran Out of Food in the Last Year: Not on file  Transportation Needs:   . Lack of Transportation (Medical): Not on file  . Lack of Transportation (Non-Medical): Not on file  Physical Activity:   . Days of Exercise per Week: Not on file  . Minutes of Exercise per  Session: Not on file  Stress:   . Feeling of Stress : Not on file  Social Connections:   . Frequency of Communication with Friends and Family: Not on file  . Frequency of Social Gatherings with Friends and Family: Not on file  . Attends Religious Services: Not on file  . Active Member of Clubs or Organizations: Not on file  . Attends Archivist Meetings: Not on file  . Marital Status: Not on file    Family History  Problem Relation Age of Onset  . Cancer Paternal Grandmother        stomach  . Stomach cancer Paternal Grandmother   . Diabetes Maternal Grandfather   . Stroke Paternal Grandfather 9  . Colon polyps Father   . Anemia Mother   . Anemia Other        Cousin   . Colon cancer Neg Hx   . Esophageal cancer Neg Hx   . Rectal cancer Neg Hx     Review of Systems  Constitutional: Negative for chills and fever.  Gastrointestinal: Negative for abdominal pain and nausea.  Genitourinary: Positive for flank pain and frequency (?  related to drinking more water). Negative for difficulty urinating, dysuria and hematuria.       No change in color or smell.        Objective:   Vitals:   06/27/20 1529  BP: 124/80  Pulse: 64  Temp: 98.2 F (36.8 C)  SpO2: 96%   BP Readings from Last 3 Encounters:  06/27/20 124/80  01/11/20 118/72  10/15/19 134/86   Wt Readings from Last 3 Encounters:  06/27/20 189 lb 9.6 oz (86 kg)  01/11/20 192 lb (87.1 kg)  10/15/19 186 lb (84.4 kg)   Body mass index is 33.59 kg/m.   Physical Exam Constitutional:      General: She is not in acute distress.    Appearance: Normal appearance. She is not ill-appearing.  HENT:     Head: Normocephalic and atraumatic.  Cardiovascular:     Rate and Rhythm: Normal rate and regular rhythm.  Pulmonary:     Effort: Pulmonary effort is normal.     Breath sounds: Normal breath sounds.  Musculoskeletal:     Right lower leg: No edema.     Left lower leg: No edema.     Comments: No tenderness  left hip, SI joint, lumbar spine or mid back.  She did have some increased pain with twisting movement  Skin:    General: Skin is  warm and dry.  Neurological:     Mental Status: She is alert.            Assessment & Plan:    See Problem List for Assessment and Plan of chronic medical problems.    This visit occurred during the SARS-CoV-2 public health emergency.  Safety protocols were in place, including screening questions prior to the visit, additional usage of staff PPE, and extensive cleaning of exam room while observing appropriate contact time as indicated for disinfecting solutions.

## 2020-06-27 ENCOUNTER — Other Ambulatory Visit: Payer: Self-pay

## 2020-06-27 ENCOUNTER — Ambulatory Visit: Payer: 59 | Admitting: Internal Medicine

## 2020-06-27 ENCOUNTER — Encounter: Payer: Self-pay | Admitting: Internal Medicine

## 2020-06-27 VITALS — BP 124/80 | HR 64 | Temp 98.2°F | Wt 189.6 lb

## 2020-06-27 DIAGNOSIS — M549 Dorsalgia, unspecified: Secondary | ICD-10-CM | POA: Diagnosis not present

## 2020-06-27 LAB — POCT URINALYSIS DIPSTICK
Bilirubin, UA: NEGATIVE
Glucose, UA: NEGATIVE
Ketones, UA: NEGATIVE
Leukocytes, UA: NEGATIVE
Nitrite, UA: NEGATIVE
Protein, UA: NEGATIVE
Spec Grav, UA: 1.015 (ref 1.010–1.025)
Urobilinogen, UA: 0.2 E.U./dL
pH, UA: 6 (ref 5.0–8.0)

## 2020-06-27 NOTE — Assessment & Plan Note (Signed)
Acute urine dip here negative for infection-there is a trace amount of blood, but this is not new-microscopic evaluation in the past and showed no blood Pain is muscular skeletal in nature.  Worse with certain activities and movements Improved with aspirin-currently taking Tylenol Advised ice, heat Can continue Tylenol or try Advil/Aleve for short duration Call if her symptoms do not completely resolve

## 2020-06-27 NOTE — Patient Instructions (Signed)
Your pain is musculoskeletal in nature.   Take tylenol or advil/aleve.   Please call if there is no improvement in your symptoms.

## 2020-07-18 ENCOUNTER — Other Ambulatory Visit: Payer: Self-pay | Admitting: Internal Medicine

## 2020-07-29 ENCOUNTER — Other Ambulatory Visit: Payer: Self-pay | Admitting: Internal Medicine

## 2020-07-29 DIAGNOSIS — I10 Essential (primary) hypertension: Secondary | ICD-10-CM

## 2020-11-07 ENCOUNTER — Ambulatory Visit: Payer: 59 | Admitting: Pulmonary Disease

## 2020-11-29 ENCOUNTER — Ambulatory Visit: Payer: 59 | Admitting: Adult Health

## 2020-11-29 ENCOUNTER — Ambulatory Visit: Payer: 59 | Admitting: Pulmonary Disease

## 2020-12-01 ENCOUNTER — Encounter: Payer: Self-pay | Admitting: Adult Health

## 2020-12-01 ENCOUNTER — Other Ambulatory Visit: Payer: Self-pay

## 2020-12-01 ENCOUNTER — Ambulatory Visit: Payer: 59 | Admitting: Adult Health

## 2020-12-01 DIAGNOSIS — G4733 Obstructive sleep apnea (adult) (pediatric): Secondary | ICD-10-CM

## 2020-12-01 NOTE — Assessment & Plan Note (Addendum)
Excellent control and compliance on nocturnal CPAP.  Keep up the good work  Plan  Patient Instructions  Keep up the good work Continue on CPAP at bedtime Work on healthy weight loss Do not drive if sleepy Call if you want to change to Dream wear nasal mask .  Follow-up with Dr. Halford Chessman in 1 year and As needed

## 2020-12-01 NOTE — Patient Instructions (Addendum)
Keep up the good work Continue on CPAP at bedtime Work on healthy weight loss Do not drive if sleepy Call if you want to change to Dream wear nasal mask .  Follow-up with Dr. Halford Chessman in 1 year and As needed

## 2020-12-01 NOTE — Progress Notes (Signed)
@Patient  ID: Angel Williamson, female    DOB: [birth year], 65 y.o.   MRN: [id number]  Chief Complaint  Patient presents with  . Follow-up  . Sleep Apnea    Referring provider: Binnie Rail, MD  HPI: 65 year old female former smoker followed for obstructive sleep apnea on nocturnal CPAP Medical history significant for hypertension and prediabetes  TEST/EVENTS : HST 06/04/16 >> AHI 46.8, SaO2 low 72%  11/04/2019-overnight oximetry-overnight oximetry on CPAP, room air-duration of sleep 6 hours and 9 minutes, time spent below 88% 0.8 minutes   12/01/2020 Follow up : OSA  Patient presents for a 1 year follow-up.  Patient has underlying severe obstructive sleep apnea is on nocturnal CPAP.  Patient says she is doing well on CPAP.  She wears her CPAP every night.  She never misses any nights.  She feels rested with no significant daytime sleepiness.  Feels that she benefits from CPAP.  CPAP download shows excellent compliance with 100% usage.  Daily average usage at 6.5 hours.  Patient is on auto CPAP 5 to 15 cm H2O.  AHI 0.7. Using Nasal pillows.   Feels so much better with wearing CPAP . Feels it has really made a difference in her life.   Allergies  Allergen Reactions  . Hydrochlorothiazide     REACTION: low potassium  . Shrimp [Shellfish Allergy]   . Benazepril Hcl     REACTION: cough    Immunization History  Administered Date(s) Administered  . PFIZER(Purple Top)SARS-COV-2 Vaccination 01/18/2020, 02/08/2020, 09/06/2020    Past Medical History:  Diagnosis Date  . Breast fibrocystic disorder   . Calcaneus fracture 2010   no surgery  . History of colonic polyps    adenomatous  . Hypertension   . Obesity   . OSA (obstructive sleep apnea)   . Osteopenia     Tobacco History: Social History   Tobacco Use  Smoking Status Former Smoker  . Packs/day: 1.00  . Years: 12.00  . Pack years: 12.00  . Types: Cigarettes  . Quit date: 10/29/1986  . Years since quitting: 34.1   Smokeless Tobacco Never Used   Counseling given: Not Answered   Outpatient Medications Prior to Visit  Medication Sig Dispense Refill  . bisacodyl (DULCOLAX) 10 MG suppository Place 1 suppository (10 mg total) rectally as needed for moderate constipation. 12 suppository 0  . Calcium Carb-Cholecalciferol (CALCIUM 1000 + D PO) Take by mouth daily.    . cholecalciferol (VITAMIN D) 1000 UNITS tablet Take 1,000 Units by mouth daily.    . fluticasone (FLONASE) 50 MCG/ACT nasal spray USE 1 SPRAY IN EACH NOSTRIL TWICE A DAY AS NEEDED 16 g 5  . LORazepam (ATIVAN) 0.5 MG tablet Take 1 tablet (0.5 mg total) by mouth 2 (two) times daily as needed for anxiety. 30 tablet 0  . losartan (COZAAR) 50 MG tablet TAKE 1 TABLET BY MOUTH EVERY DAY 90 tablet 1  . spironolactone (ALDACTONE) 25 MG tablet TAKE 1 TABLET BY MOUTH EVERY DAY 90 tablet 1   No facility-administered medications prior to visit.     Review of Systems:   Constitutional:   No  weight loss, night sweats,  Fevers, chills, fatigue, or  lassitude.  HEENT:   No headaches,  Difficulty swallowing,  Tooth/dental problems, or  Sore throat,                No sneezing, itching, ear ache, nasal congestion, post nasal drip,   CV:  No chest pain,  Orthopnea,  PND, swelling in lower extremities, anasarca, dizziness, palpitations, syncope.   GI  No heartburn, indigestion, abdominal pain, nausea, vomiting, diarrhea, change in bowel habits, loss of appetite, bloody stools.   Resp: No shortness of breath with exertion or at rest.  No excess mucus, no productive cough,  No non-productive cough,  No coughing up of blood.  No change in color of mucus.  No wheezing.  No chest wall deformity  Skin: no rash or lesions.  GU: no dysuria, change in color of urine, no urgency or frequency.  No flank pain, no hematuria   MS:  No joint pain or swelling.  No decreased range of motion.  No back pain.    Physical Exam  BP 110/70 (BP Location: Left Arm, Patient  Position: Sitting, Cuff Size: Normal)   Pulse 71   Temp (!) 97.5 F (36.4 C) (Temporal)   Ht 5\' 3"  (1.6 m)   Wt 194 lb (88 kg)   SpO2 97%   BMI 34.37 kg/m   GEN: A/Ox3; pleasant , NAD, well nourished    HEENT:  [place]/AT,    NOSE-clear, THROAT-clear, no lesions, no postnasal drip or exudate noted.  Class II-III NP airway   NECK:  Supple w/ fair ROM; no JVD; normal carotid impulses w/o bruits; no thyromegaly or nodules palpated; no lymphadenopathy.    RESP  Clear  P & A; w/o, wheezes/ rales/ or rhonchi. no accessory muscle use, no dullness to percussion  CARD:  RRR, no m/r/g, no peripheral edema, pulses intact, no cyanosis or clubbing.  GI:   Soft & nt; nml bowel sounds; no organomegaly or masses detected.   Musco: Warm bil, no deformities or joint swelling noted.   Neuro: alert, no focal deficits noted.    Skin: Warm, no lesions or rashes    Lab Results:  BNP No results found for: BNP  ProBNP No results found for: PROBNP  Imaging: No results found.    No flowsheet data found.  No results found for: NITRICOXIDE      Assessment & Plan:   OSA (obstructive sleep apnea), Severe Excellent control and compliance on nocturnal CPAP.  Keep up the good work  Plan  Patient Instructions  Keep up the good work Continue on CPAP at bedtime Work on healthy weight loss Do not drive if sleepy Call if you want to change to Dream wear nasal mask .  Follow-up with Dr. Halford Chessman in 1 year and As needed       Obesity Encouraged on healthy weight loss     Rexene Edison, NP 12/01/2020

## 2020-12-01 NOTE — Assessment & Plan Note (Signed)
Encouraged on healthy weight loss 

## 2020-12-01 NOTE — Addendum Note (Signed)
Addended by: Vanessa Barbara on: 12/01/2020 04:04 PM   Modules accepted: Orders

## 2020-12-05 NOTE — Progress Notes (Signed)
Subjective:    Patient ID: Angel Williamson, female    DOB: 04-29-1956, 65 y.o.   MRN: [id number]  HPI The patient is here for an acute visit.   Heavy legs - her legs ( upper leg) feel tight and heavy at times.  Recently it is all the time.  No pain with walking.  By the end of the day her legs getting tighter/heavier. No pain.  They are better first thing in the morning.  She denies N/T in legs.  No lower back pain.  No swelling.  No weakness.    Doing dishes leaning over the sink she will get some lowe back pain.   Wearing loose clothing helps.     Medications and allergies reviewed with patient and updated if appropriate.  Patient Active Problem List   Diagnosis Date Noted  . Varicose veins of both lower extremities 12/06/2020  . Mid back pain on left side 06/27/2020  . Intermittent lightheadedness 10/15/2019  . Dizziness 10/05/2019  . Constipation 10/10/2018  . Right-sided low back pain without sciatica 09/23/2018  . Rectal discharge 09/23/2018  . Anxiety 07/22/2017  . OSA (obstructive sleep apnea), Severe 08/22/2016  . Prediabetes 01/11/2016  . Obesity 01/11/2016  . Skin cancer, basal cell 04/18/2012  . COLONIC POLYPS, HX OF 12/13/2009  . Osteopenia 03/29/2009  . Essential hypertension 11/14/2007    Current Outpatient Medications on File Prior to Visit  Medication Sig Dispense Refill  . bisacodyl (DULCOLAX) 10 MG suppository Place 1 suppository (10 mg total) rectally as needed for moderate constipation. 12 suppository 0  . Calcium Carb-Cholecalciferol (CALCIUM 1000 + D PO) Take by mouth daily.    . cholecalciferol (VITAMIN D) 1000 UNITS tablet Take 1,000 Units by mouth daily.    . fluticasone (FLONASE) 50 MCG/ACT nasal spray USE 1 SPRAY IN EACH NOSTRIL TWICE A DAY AS NEEDED 16 g 5  . LORazepam (ATIVAN) 0.5 MG tablet Take 1 tablet (0.5 mg total) by mouth 2 (two) times daily as needed for anxiety. 30 tablet 0  . losartan (COZAAR) 50 MG tablet TAKE 1 TABLET BY MOUTH  EVERY DAY 90 tablet 1  . spironolactone (ALDACTONE) 25 MG tablet TAKE 1 TABLET BY MOUTH EVERY DAY 90 tablet 1   No current facility-administered medications on file prior to visit.    Past Medical History:  Diagnosis Date  . Breast fibrocystic disorder   . Calcaneus fracture 2010   no surgery  . History of colonic polyps    adenomatous  . Hypertension   . Obesity   . OSA (obstructive sleep apnea)   . Osteopenia     Past Surgical History:  Procedure Laterality Date  . BREAST CYST ASPIRATION    . COLONOSCOPY  2014   negative  . COLONOSCOPY W/ POLYPECTOMY     adenomatous polyps 2003 & 2007 Due 2012. Dr.Kaplan   . G 2 P 2    . no prior surgery      Social History   Socioeconomic History  . Marital status: Married    Spouse name: Not on file  . Number of children: Not on file  . Years of education: Not on file  . Highest education level: Not on file  Occupational History  . Occupation: Surveyor, minerals  Tobacco Use  . Smoking status: Former Smoker    Packs/day: 1.00    Years: 12.00    Pack years: 12.00    Types: Cigarettes    Quit date: 10/29/1986  Years since quitting: 34.1  . Smokeless tobacco: Never Used  Vaping Use  . Vaping Use: Never used  Substance and Sexual Activity  . Alcohol use: No  . Drug use: No  . Sexual activity: Not on file  Other Topics Concern  . Not on file  Social History Narrative  . Not on file   Social Determinants of Health   Financial Resource Strain: Not on file  Food Insecurity: Not on file  Transportation Needs: Not on file  Physical Activity: Not on file  Stress: Not on file  Social Connections: Not on file    Family History  Problem Relation Age of Onset  . Cancer Paternal Grandmother        stomach  . Stomach cancer Paternal Grandmother   . Diabetes Maternal Grandfather   . Stroke Paternal Grandfather 4  . Colon polyps Father   . Anemia Mother   . Anemia Other        Cousin   . Colon cancer Neg Hx   .  Esophageal cancer Neg Hx   . Rectal cancer Neg Hx     Review of Systems  Cardiovascular: Negative for leg swelling.  Musculoskeletal: Positive for back pain (lower back with leaning over sink).  Skin: Negative for color change.  Neurological: Negative for weakness and numbness.       Objective:   Vitals:   12/06/20 1033  BP: 120/84  Pulse: (!) 58  Temp: 98.4 F (36.9 C)  SpO2: 99%   BP Readings from Last 3 Encounters:  12/06/20 120/84  12/01/20 110/70  06/27/20 124/80   Wt Readings from Last 3 Encounters:  12/06/20 191 lb (86.6 kg)  12/01/20 194 lb (88 kg)  06/27/20 189 lb 9.6 oz (86 kg)   Body mass index is 33.83 kg/m.  Depression screen [facility] 2/9 12/06/2020 10/15/2019 07/28/2018 07/28/2018 07/22/2017  Decreased Interest 0 0 0 0 0  Down, Depressed, Hopeless 1 0 0 0 0  PHQ - 2 Score 1 0 0 0 0  Altered sleeping 0 - 0 - -  Tired, decreased energy 0 - 0 - -  Change in appetite 0 - 0 - -  Feeling bad or failure about yourself  0 - 0 - -  Trouble concentrating 0 - 0 - -  Moving slowly or fidgety/restless 0 - 0 - -  Suicidal thoughts 0 - 0 - -  PHQ-9 Score 1 - 0 - -  Difficult doing work/chores Not difficult at all - - - -      Physical Exam Constitutional:      General: She is not in acute distress.    Appearance: Normal appearance. She is not ill-appearing.  HENT:     Head: Normocephalic and atraumatic.  Cardiovascular:     Pulses: Normal pulses.     Comments: Varicose veins b/l LE - nontender to palpation Musculoskeletal:     Right lower leg: No edema.     Left lower leg: No edema.  Skin:    General: Skin is warm.     Findings: No rash.  Neurological:     Mental Status: She is alert.     Sensory: No sensory deficit.     Motor: No weakness.     Gait: Gait normal.            Assessment & Plan:     Screened for depression using the PHQ 9 scale.  No evidence of depression.    See Problem List  for Assessment and Plan of chronic medical problems.     This visit occurred during the SARS-CoV-2 public health emergency.  Safety protocols were in place, including screening questions prior to the visit, additional usage of staff PPE, and extensive cleaning of exam room while observing appropriate contact time as indicated for disinfecting solutions.

## 2020-12-06 ENCOUNTER — Ambulatory Visit: Payer: 59 | Admitting: Internal Medicine

## 2020-12-06 ENCOUNTER — Encounter: Payer: Self-pay | Admitting: Internal Medicine

## 2020-12-06 ENCOUNTER — Other Ambulatory Visit: Payer: Self-pay

## 2020-12-06 VITALS — BP 120/84 | HR 58 | Temp 98.4°F | Ht 63.0 in | Wt 191.0 lb

## 2020-12-06 DIAGNOSIS — I8393 Asymptomatic varicose veins of bilateral lower extremities: Secondary | ICD-10-CM | POA: Diagnosis not present

## 2020-12-06 DIAGNOSIS — Z1331 Encounter for screening for depression: Secondary | ICD-10-CM

## 2020-12-06 NOTE — Patient Instructions (Signed)
   A referral was ordered for vascular surgery.      Someone from their office will call you to schedule an appointment.

## 2020-12-06 NOTE — Assessment & Plan Note (Signed)
Acute Having tightness, heaviness sensation in b/l upper legs only No weakness, swelling Has varicose veins b/l LE - ? Cause Will refer to vascular

## 2020-12-14 NOTE — Progress Notes (Signed)
Reviewed and agree with assessment/plan.   Jadan Rouillard, MD [facility] 12/14/2020, 10:57 AM Pager:  [phone]  

## 2020-12-23 ENCOUNTER — Other Ambulatory Visit: Payer: Self-pay | Admitting: Internal Medicine

## 2020-12-23 DIAGNOSIS — Z1231 Encounter for screening mammogram for malignant neoplasm of breast: Secondary | ICD-10-CM

## 2020-12-27 ENCOUNTER — Ambulatory Visit
Admission: RE | Admit: 2020-12-27 | Discharge: 2020-12-27 | Disposition: A | Discharge status: Home / Self Care | Payer: 59 | Source: Ambulatory Visit | Attending: Internal Medicine | Admitting: Internal Medicine

## 2020-12-27 ENCOUNTER — Other Ambulatory Visit: Payer: Self-pay

## 2020-12-27 DIAGNOSIS — Z1231 Encounter for screening mammogram for malignant neoplasm of breast: Secondary | ICD-10-CM

## 2020-12-29 ENCOUNTER — Encounter: Payer: Self-pay | Admitting: Internal Medicine

## 2020-12-29 ENCOUNTER — Other Ambulatory Visit: Payer: Self-pay

## 2020-12-29 ENCOUNTER — Ambulatory Visit (INDEPENDENT_AMBULATORY_CARE_PROVIDER_SITE_OTHER): Payer: Medicare HMO | Admitting: Internal Medicine

## 2020-12-29 DIAGNOSIS — I10 Essential (primary) hypertension: Secondary | ICD-10-CM | POA: Diagnosis not present

## 2020-12-29 MED ORDER — AMLODIPINE BESYLATE 5 MG PO TABS: 5.0000 mg | ORAL_TABLET | Freq: Every day | ORAL | 3 refills | Status: DC

## 2020-12-29 NOTE — Assessment & Plan Note (Signed)
Chronic Blood pressure very well controlled on current regimen, but she is possibly having side effects to the medications and we will stop 1 and the other Discussed options Stop losartan 50 mg daily Continue spironolactone 25 mg daily for now Start amlodipine 5 mg daily She will monitor her blood pressure and ideally update me.  If her blood pressure is well controlled can consider decreasing spironolactone to 12.5 mg daily and seeing how she does with that.  Ultimate goal would be to discontinue spironolactone completely May need to consider beta-blocker or hydralazine depending on heart rate if additional medication is needed

## 2020-12-29 NOTE — Patient Instructions (Signed)
Stop losartan and start amlodipine 5 mg daily.  Continue the spironolactone for now.     Monitor your BP and HR -- if well controlled try decreasing the spironolactone to 1/2 of a pill.

## 2020-12-29 NOTE — Progress Notes (Signed)
Subjective:    Patient ID: Angel Williamson, female    DOB: [birth year], 65 y.o.   MRN: [id number]  HPI The patient is here for an acute visit.   R Arm pain - tightness and discomfort.  none at rest.  Some discomfort with movement. Feels swollen - no swellling.  No N/T.   No pain with palpation.  She feels this may be related to her BP medication and would like to change it.    She had her mammogram Tuesday.  She has intermittent pressure in breasts and they swell.  Her symptoms do improve after having had the mammogram she wondered if this was related to spironolactone.  She also notices occasional tingling in face  Overall she is concerned that these are all side effects from her blood pressure medications and is interested in switching them.  Her blood pressure has been well controlled on her current regimen.  Amlodipine    Medications and allergies reviewed with patient and updated if appropriate.  Patient Active Problem List   Diagnosis Date Noted  . Varicose veins of both lower extremities 12/06/2020  . Mid back pain on left side 06/27/2020  . Intermittent lightheadedness 10/15/2019  . Dizziness 10/05/2019  . Constipation 10/10/2018  . Right-sided low back pain without sciatica 09/23/2018  . Rectal discharge 09/23/2018  . Anxiety 07/22/2017  . OSA (obstructive sleep apnea), Severe 08/22/2016  . Prediabetes 01/11/2016  . Obesity 01/11/2016  . Skin cancer, basal cell 04/18/2012  . COLONIC POLYPS, HX OF 12/13/2009  . Osteopenia 03/29/2009  . Essential hypertension 11/14/2007    Current Outpatient Medications on File Prior to Visit  Medication Sig Dispense Refill  . bisacodyl (DULCOLAX) 10 MG suppository Place 1 suppository (10 mg total) rectally as needed for moderate constipation. 12 suppository 0  . Calcium Carb-Cholecalciferol (CALCIUM 1000 + D PO) Take by mouth daily.    . cholecalciferol (VITAMIN D) 1000 UNITS tablet Take 1,000 Units by mouth daily.    .  fluticasone (FLONASE) 50 MCG/ACT nasal spray USE 1 SPRAY IN EACH NOSTRIL TWICE A DAY AS NEEDED 16 g 5  . LORazepam (ATIVAN) 0.5 MG tablet Take 1 tablet (0.5 mg total) by mouth 2 (two) times daily as needed for anxiety. 30 tablet 0  . losartan (COZAAR) 50 MG tablet TAKE 1 TABLET BY MOUTH EVERY DAY 90 tablet 1  . spironolactone (ALDACTONE) 25 MG tablet TAKE 1 TABLET BY MOUTH EVERY DAY 90 tablet 1   No current facility-administered medications on file prior to visit.    Past Medical History:  Diagnosis Date  . Breast fibrocystic disorder   . Calcaneus fracture 2010   no surgery  . History of colonic polyps    adenomatous  . Hypertension   . Obesity   . OSA (obstructive sleep apnea)   . Osteopenia     Past Surgical History:  Procedure Laterality Date  . BREAST CYST ASPIRATION    . COLONOSCOPY  2014   negative  . COLONOSCOPY W/ POLYPECTOMY     adenomatous polyps 2003 & 2007 Due 2012. Dr.Kaplan   . G 2 P 2    . no prior surgery      Social History   Socioeconomic History  . Marital status: Married    Spouse name: Not on file  . Number of children: Not on file  . Years of education: Not on file  . Highest education level: Not on file  Occupational History  . Occupation: office  assistant  Tobacco Use  . Smoking status: Former Smoker    Packs/day: 1.00    Years: 12.00    Pack years: 12.00    Types: Cigarettes    Quit date: 10/29/1986    Years since quitting: 34.1  . Smokeless tobacco: Never Used  Vaping Use  . Vaping Use: Never used  Substance and Sexual Activity  . Alcohol use: No  . Drug use: No  . Sexual activity: Not on file  Other Topics Concern  . Not on file  Social History Narrative  . Not on file   Social Determinants of Health   Financial Resource Strain: Not on file  Food Insecurity: Not on file  Transportation Needs: Not on file  Physical Activity: Not on file  Stress: Not on file  Social Connections: Not on file    Family History  Problem  Relation Age of Onset  . Cancer Paternal Grandmother        stomach  . Stomach cancer Paternal Grandmother   . Diabetes Maternal Grandfather   . Stroke Paternal Grandfather 47  . Colon polyps Father   . Anemia Mother   . Anemia Other        Cousin   . Colon cancer Neg Hx   . Esophageal cancer Neg Hx   . Rectal cancer Neg Hx     Review of Systems  Constitutional: Negative for fever.  Respiratory: Negative for shortness of breath.   Cardiovascular: Negative for chest pain, palpitations and leg swelling.  Neurological: Negative for light-headedness and headaches.       Objective:   Vitals:   12/29/20 1022  BP: 120/84  Pulse: 60  Temp: 98.3 F (36.8 C)  SpO2: 99%   BP Readings from Last 3 Encounters:  12/29/20 120/84  12/06/20 120/84  12/01/20 110/70   Wt Readings from Last 3 Encounters:  12/29/20 192 lb (87.1 kg)  12/06/20 191 lb (86.6 kg)  12/01/20 194 lb (88 kg)   Body mass index is 34.01 kg/m.   Physical Exam    Constitutional: Appears well-developed and well-nourished. No distress.  Head: Normocephalic and atraumatic.  Neck: Neck supple. No tracheal deviation present. No thyromegaly present.  No cervical lymphadenopathy Cardiovascular: Normal rate, regular rhythm and normal heart sounds.  No murmur heard. No carotid bruit .  No edema Pulmonary/Chest: Effort normal and breath sounds normal. No respiratory distress. No has no wheezes. No rales.  Skin: Skin is warm and dry. Not diaphoretic.  Psychiatric: Normal mood and affect. Behavior is normal.       Assessment & Plan:    See Problem List for Assessment and Plan of chronic medical problems.    This visit occurred during the SARS-CoV-2 public health emergency.  Safety protocols were in place, including screening questions prior to the visit, additional usage of staff PPE, and extensive cleaning of exam room while observing appropriate contact time as indicated for disinfecting solutions.

## 2021-01-03 NOTE — Progress Notes (Signed)
Subjective:    Patient ID: Angel Williamson, female    DOB: 1956-08-16, 65 y.o.   MRN: [id number]  HPI The patient is here for follow up of their chronic medical problems, including htn   3/3 we stopped her losartan and continued her spironolactone 25 mg daily.  We started amlodipine 5 mg daily.   She has not noticed any difference in her symptoms.  Her blood pressure is higher than it typically is, which concerned her.  She did have a headache on a couple of occasions and was concerned that was related to the higher blood pressure.  She is still concerned some of her symptoms are related to spironolactone and does want to come off of it.  BP 131/83   73 on 3/7    BP  132/88  78  On  3/8     Medications and allergies reviewed with patient and updated if appropriate.  Patient Active Problem List   Diagnosis Date Noted  . Varicose veins of both lower extremities 12/06/2020  . Mid back pain on left side 06/27/2020  . Intermittent lightheadedness 10/15/2019  . Dizziness 10/05/2019  . Constipation 10/10/2018  . Right-sided low back pain without sciatica 09/23/2018  . Rectal discharge 09/23/2018  . Anxiety 07/22/2017  . OSA (obstructive sleep apnea), Severe 08/22/2016  . Prediabetes 01/11/2016  . Obesity 01/11/2016  . Skin cancer, basal cell 04/18/2012  . COLONIC POLYPS, HX OF 12/13/2009  . Osteopenia 03/29/2009  . Essential hypertension 11/14/2007    Current Outpatient Medications on File Prior to Visit  Medication Sig Dispense Refill  . amLODipine (NORVASC) 5 MG tablet Take 1 tablet (5 mg total) by mouth daily. 90 tablet 3  . bisacodyl (DULCOLAX) 10 MG suppository Place 1 suppository (10 mg total) rectally as needed for moderate constipation. 12 suppository 0  . Calcium Carb-Cholecalciferol (CALCIUM 1000 + D PO) Take by mouth daily.    . cholecalciferol (VITAMIN D) 1000 UNITS tablet Take 1,000 Units by mouth daily.    . fluticasone (FLONASE) 50 MCG/ACT nasal spray USE 1  SPRAY IN EACH NOSTRIL TWICE A DAY AS NEEDED 16 g 5  . LORazepam (ATIVAN) 0.5 MG tablet Take 1 tablet (0.5 mg total) by mouth 2 (two) times daily as needed for anxiety. 30 tablet 0  . spironolactone (ALDACTONE) 25 MG tablet TAKE 1 TABLET BY MOUTH EVERY DAY 90 tablet 1   No current facility-administered medications on file prior to visit.    Past Medical History:  Diagnosis Date  . Breast fibrocystic disorder   . Calcaneus fracture 2010   no surgery  . History of colonic polyps    adenomatous  . Hypertension   . Obesity   . OSA (obstructive sleep apnea)   . Osteopenia     Past Surgical History:  Procedure Laterality Date  . BREAST CYST ASPIRATION    . COLONOSCOPY  2014   negative  . COLONOSCOPY W/ POLYPECTOMY     adenomatous polyps 2003 & 2007 Due 2012. Dr.Kaplan   . G 2 P 2    . no prior surgery      Social History   Socioeconomic History  . Marital status: Married    Spouse name: Not on file  . Number of children: Not on file  . Years of education: Not on file  . Highest education level: Not on file  Occupational History  . Occupation: Surveyor, minerals  Tobacco Use  . Smoking status: Former Smoker  Packs/day: 1.00    Years: 12.00    Pack years: 12.00    Types: Cigarettes    Quit date: 10/29/1986    Years since quitting: 34.2  . Smokeless tobacco: Never Used  Vaping Use  . Vaping Use: Never used  Substance and Sexual Activity  . Alcohol use: No  . Drug use: No  . Sexual activity: Not on file  Other Topics Concern  . Not on file  Social History Narrative  . Not on file   Social Determinants of Health   Financial Resource Strain: Not on file  Food Insecurity: Not on file  Transportation Needs: Not on file  Physical Activity: Not on file  Stress: Not on file  Social Connections: Not on file    Family History  Problem Relation Age of Onset  . Cancer Paternal Grandmother        stomach  . Stomach cancer Paternal Grandmother   . Diabetes Maternal  Grandfather   . Stroke Paternal Grandfather 25  . Colon polyps Father   . Anemia Mother   . Anemia Other        Cousin   . Colon cancer Neg Hx   . Esophageal cancer Neg Hx   . Rectal cancer Neg Hx     Review of Systems  Constitutional: Negative for fever.  Cardiovascular: Negative for chest pain, palpitations and leg swelling.  Neurological: Negative for dizziness, light-headedness and headaches.       Objective:   Vitals:   01/04/21 0823  BP: 130/78  Pulse: 78  Temp: 98.6 F (37 C)  SpO2: 98%   BP Readings from Last 3 Encounters:  01/04/21 130/78  12/29/20 120/84  12/06/20 120/84   Wt Readings from Last 3 Encounters:  01/04/21 191 lb (86.6 kg)  12/29/20 192 lb (87.1 kg)  12/06/20 191 lb (86.6 kg)   Body mass index is 33.83 kg/m.   Physical Exam    Constitutional: Appears well-developed and well-nourished. No distress.  HENT:  Head: Normocephalic and atraumatic.  Cardiovascular: Normal rate, regular rhythm and normal heart sounds.   No murmur heard. No carotid bruit .  No edema Pulmonary/Chest: Effort normal and breath sounds normal. No respiratory distress. No has no wheezes. No rales.  Skin: Skin is warm and dry. Not diaphoretic.        Assessment & Plan:    See Problem List for Assessment and Plan of chronic medical problems.    This visit occurred during the SARS-CoV-2 public health emergency.  Safety protocols were in place, including screening questions prior to the visit, additional usage of staff PPE, and extensive cleaning of exam room while observing appropriate contact time as indicated for disinfecting solutions.

## 2021-01-04 ENCOUNTER — Other Ambulatory Visit: Payer: Self-pay

## 2021-01-04 ENCOUNTER — Ambulatory Visit (INDEPENDENT_AMBULATORY_CARE_PROVIDER_SITE_OTHER): Payer: Medicare HMO | Admitting: Internal Medicine

## 2021-01-04 ENCOUNTER — Encounter: Payer: Self-pay | Admitting: Internal Medicine

## 2021-01-04 VITALS — BP 130/78 | HR 78 | Temp 98.6°F | Ht 63.0 in | Wt 191.0 lb

## 2021-01-04 DIAGNOSIS — I10 Essential (primary) hypertension: Secondary | ICD-10-CM | POA: Diagnosis not present

## 2021-01-04 MED ORDER — NEBIVOLOL HCL 5 MG PO TABS: 5.0000 mg | ORAL_TABLET | Freq: Every day | ORAL | 5 refills | Status: DC

## 2021-01-04 NOTE — Assessment & Plan Note (Signed)
Chronic Blood pressure very acceptable here-has been slightly higher at home We are in transition of changing her medications because of concerns of possible side effects from losartan and spironolactone Discontinue spironolactone Continue amlodipine 5 mg daily Start Bystolic 5 mg daily Depending on how she does with these 2 medications we can consider transitioning her to just 1 versus trying something different.  Discussed side effects of higher doses of medications, so 2 medications at low doses may not be better for her-we will see She will monitor her blood pressure at home-discussed goal She is follow-up with me next week

## 2021-01-04 NOTE — Patient Instructions (Signed)
Continue amlodipine 5 mg daily   Stop spironolactone.    Start bystolic 5 mg daily.

## 2021-01-06 ENCOUNTER — Other Ambulatory Visit: Payer: Self-pay | Admitting: *Deleted

## 2021-01-06 DIAGNOSIS — I8393 Asymptomatic varicose veins of bilateral lower extremities: Secondary | ICD-10-CM

## 2021-01-10 DIAGNOSIS — E559 Vitamin D deficiency, unspecified: Secondary | ICD-10-CM | POA: Insufficient documentation

## 2021-01-10 NOTE — Patient Instructions (Signed)
Blood work was ordered.     Medications changes include :   Adjust your BP meds as needed  Your prescription(s) have been submitted to your pharmacy. Please take as directed and contact our office if you believe you are having problem(s) with the medication(s).    Please followup in 1 year   Health Maintenance, Female Adopting a healthy lifestyle and getting preventive care are important in promoting health and wellness. Ask your health care provider about:  The right schedule for you to have regular tests and exams.  Things you can do on your own to prevent diseases and keep yourself healthy. What should I know about diet, weight, and exercise? Eat a healthy diet  Eat a diet that includes plenty of vegetables, fruits, low-fat dairy products, and lean protein.  Do not eat a lot of foods that are high in solid fats, added sugars, or sodium.   Maintain a healthy weight Body mass index (BMI) is used to identify weight problems. It estimates body fat based on height and weight. Your health care provider can help determine your BMI and help you achieve or maintain a healthy weight. Get regular exercise Get regular exercise. This is one of the most important things you can do for your health. Most adults should:  Exercise for at least 150 minutes each week. The exercise should increase your heart rate and make you sweat (moderate-intensity exercise).  Do strengthening exercises at least twice a week. This is in addition to the moderate-intensity exercise.  Spend less time sitting. Even light physical activity can be beneficial. Watch cholesterol and blood lipids Have your blood tested for lipids and cholesterol at 65 years of age, then have this test every 5 years. Have your cholesterol levels checked more often if:  Your lipid or cholesterol levels are high.  You are older than 65 years of age.  You are at high risk for heart disease. What should I know about cancer  screening? Depending on your health history and family history, you may need to have cancer screening at various ages. This may include screening for:  Breast cancer.  Cervical cancer.  Colorectal cancer.  Skin cancer.  Lung cancer. What should I know about heart disease, diabetes, and high blood pressure? Blood pressure and heart disease  High blood pressure causes heart disease and increases the risk of stroke. This is more likely to develop in people who have high blood pressure readings, are of African descent, or are overweight.  Have your blood pressure checked: ? Every 3-5 years if you are 63-23 years of age. ? Every year if you are 10 years old or older. Diabetes Have regular diabetes screenings. This checks your fasting blood sugar level. Have the screening done:  Once every three years after age 42 if you are at a normal weight and have a low risk for diabetes.  More often and at a younger age if you are overweight or have a high risk for diabetes. What should I know about preventing infection? Hepatitis B If you have a higher risk for hepatitis B, you should be screened for this virus. Talk with your health care provider to find out if you are at risk for hepatitis B infection. Hepatitis C Testing is recommended for:  Everyone born from [birth year] through 1965.  Anyone with known risk factors for hepatitis C. Sexually transmitted infections (STIs)  Get screened for STIs, including gonorrhea and chlamydia, if: ? You are sexually active and are younger  than 65 years of age. ? You are older than 65 years of age and your health care provider tells you that you are at risk for this type of infection. ? Your sexual activity has changed since you were last screened, and you are at increased risk for chlamydia or gonorrhea. Ask your health care provider if you are at risk.  Ask your health care provider about whether you are at high risk for HIV. Your health care provider may  recommend a prescription medicine to help prevent HIV infection. If you choose to take medicine to prevent HIV, you should first get tested for HIV. You should then be tested every 3 months for as long as you are taking the medicine. Pregnancy  If you are about to stop having your period (premenopausal) and you may become pregnant, seek counseling before you get pregnant.  Take 400 to 800 micrograms (mcg) of folic acid every day if you become pregnant.  Ask for birth control (contraception) if you want to prevent pregnancy. Osteoporosis and menopause Osteoporosis is a disease in which the bones lose minerals and strength with aging. This can result in bone fractures. If you are 21 years old or older, or if you are at risk for osteoporosis and fractures, ask your health care provider if you should:  Be screened for bone loss.  Take a calcium or vitamin D supplement to lower your risk of fractures.  Be given hormone replacement therapy (HRT) to treat symptoms of menopause. Follow these instructions at home: Lifestyle  Do not use any products that contain nicotine or tobacco, such as cigarettes, e-cigarettes, and chewing tobacco. If you need help quitting, ask your health care provider.  Do not use street drugs.  Do not share needles.  Ask your health care provider for help if you need support or information about quitting drugs. Alcohol use  Do not drink alcohol if: ? Your health care provider tells you not to drink. ? You are pregnant, may be pregnant, or are planning to become pregnant.  If you drink alcohol: ? Limit how much you use to 0-1 drink a day. ? Limit intake if you are breastfeeding.  Be aware of how much alcohol is in your drink. In the [place] one drink equals one 12 oz bottle of beer (355 mL), one 5 oz glass of wine (148 mL), or one 1 oz glass of hard liquor (44 mL). General instructions  Schedule regular health, dental, and eye exams.  Stay current with your  vaccines.  Tell your health care provider if: ? You often feel depressed. ? You have ever been abused or do not feel safe at home. Summary  Adopting a healthy lifestyle and getting preventive care are important in promoting health and wellness.  Follow your health care provider's instructions about healthy diet, exercising, and getting tested or screened for diseases.  Follow your health care provider's instructions on monitoring your cholesterol and blood pressure. This information is not intended to replace advice given to you by your health care provider. Make sure you discuss any questions you have with your health care provider. Document Revised: 10/08/2018 Document Reviewed: 10/08/2018 Elsevier Patient Education  2021 [employer].

## 2021-01-10 NOTE — Progress Notes (Signed)
Subjective:    Patient ID: Angel Williamson, female    DOB: 07-14-1956, 65 y.o.   MRN: [id number]   This visit occurred during the SARS-CoV-2 public health emergency.  Safety protocols were in place, including screening questions prior to the visit, additional usage of staff PPE, and extensive cleaning of exam room while observing appropriate contact time as indicated for disinfecting solutions.    HPI She is here for a physical exam.   She feels better - arms and legs feel better.    Medications and allergies reviewed with patient and updated if appropriate.  Patient Active Problem List   Diagnosis Date Noted  . Vitamin D deficiency 01/10/2021  . Varicose veins of both lower extremities 12/06/2020  . Mid back pain on left side 06/27/2020  . Intermittent lightheadedness 10/15/2019  . Dizziness 10/05/2019  . Constipation 10/10/2018  . Right-sided low back pain without sciatica 09/23/2018  . Rectal discharge 09/23/2018  . Anxiety 07/22/2017  . OSA (obstructive sleep apnea), Severe 08/22/2016  . Prediabetes 01/11/2016  . Obesity 01/11/2016  . Skin cancer, basal cell 04/18/2012  . COLONIC POLYPS, HX OF 12/13/2009  . Osteopenia 03/29/2009  . Essential hypertension 11/14/2007    Current Outpatient Medications on File Prior to Visit  Medication Sig Dispense Refill  . amLODipine (NORVASC) 5 MG tablet Take 1 tablet (5 mg total) by mouth daily. 90 tablet 3  . bisacodyl (DULCOLAX) 10 MG suppository Place 1 suppository (10 mg total) rectally as needed for moderate constipation. 12 suppository 0  . Calcium Carb-Cholecalciferol (CALCIUM 1000 + D PO) Take by mouth daily.    . cholecalciferol (VITAMIN D) 1000 UNITS tablet Take 1,000 Units by mouth daily.    . fluticasone (FLONASE) 50 MCG/ACT nasal spray USE 1 SPRAY IN EACH NOSTRIL TWICE A DAY AS NEEDED 16 g 5  . LORazepam (ATIVAN) 0.5 MG tablet Take 1 tablet (0.5 mg total) by mouth 2 (two) times daily as needed for anxiety. 30 tablet 0   . nebivolol (BYSTOLIC) 5 MG tablet Take 1 tablet (5 mg total) by mouth daily. 30 tablet 5   No current facility-administered medications on file prior to visit.    Past Medical History:  Diagnosis Date  . Breast fibrocystic disorder   . Calcaneus fracture 2010   no surgery  . History of colonic polyps    adenomatous  . Hypertension   . Obesity   . OSA (obstructive sleep apnea)   . Osteopenia     Past Surgical History:  Procedure Laterality Date  . BREAST CYST ASPIRATION    . COLONOSCOPY  2014   negative  . COLONOSCOPY W/ POLYPECTOMY     adenomatous polyps 2003 & 2007 Due 2012. Dr.Kaplan   . G 2 P 2    . no prior surgery      Social History   Socioeconomic History  . Marital status: Married    Spouse name: Not on file  . Number of children: Not on file  . Years of education: Not on file  . Highest education level: Not on file  Occupational History  . Occupation: Surveyor, minerals  Tobacco Use  . Smoking status: Former Smoker    Packs/day: 1.00    Years: 12.00    Pack years: 12.00    Types: Cigarettes    Quit date: 10/29/1986    Years since quitting: 34.2  . Smokeless tobacco: Never Used  Vaping Use  . Vaping Use: Never used  Substance and  Sexual Activity  . Alcohol use: No  . Drug use: No  . Sexual activity: Not on file  Other Topics Concern  . Not on file  Social History Narrative  . Not on file   Social Determinants of Health   Financial Resource Strain: Not on file  Food Insecurity: Not on file  Transportation Needs: Not on file  Physical Activity: Not on file  Stress: Not on file  Social Connections: Not on file    Family History  Problem Relation Age of Onset  . Cancer Paternal Grandmother        stomach  . Stomach cancer Paternal Grandmother   . Diabetes Maternal Grandfather   . Stroke Paternal Grandfather 27  . Colon polyps Father   . Anemia Mother   . Anemia Other        Cousin   . Colon cancer Neg Hx   . Esophageal cancer Neg Hx    . Rectal cancer Neg Hx     Review of Systems  Constitutional: Negative for chills and fever.  Eyes: Negative for visual disturbance.  Respiratory: Negative for cough, shortness of breath and wheezing.   Cardiovascular: Negative for chest pain, palpitations and leg swelling.  Gastrointestinal: Positive for constipation. Negative for abdominal pain, blood in stool, diarrhea and nausea.       No gerd  Genitourinary: Negative for dysuria and hematuria.  Musculoskeletal: Negative for arthralgias and back pain.  Skin: Negative for color change and rash.  Neurological: Negative for light-headedness, numbness and headaches.       Tightness and tightness sensation in arms and legs  Psychiatric/Behavioral: Negative for dysphoric mood. The patient is nervous/anxious.        Objective:   Vitals:   01/11/21 1324  BP: 118/80  Pulse: 66  Temp: 98.1 F (36.7 C)  SpO2: 96%   Filed Weights   01/11/21 1324  Weight: 193 lb (87.5 kg)   Body mass index is 34.19 kg/m.  BP Readings from Last 3 Encounters:  01/11/21 118/80  01/04/21 130/78  12/29/20 120/84    Wt Readings from Last 3 Encounters:  01/11/21 193 lb (87.5 kg)  01/04/21 191 lb (86.6 kg)  12/29/20 192 lb (87.1 kg)     Physical Exam Constitutional: She appears well-developed and well-nourished. No distress.  HENT:  Head: Normocephalic and atraumatic.  Right Ear: External ear normal. Normal ear canal and TM Left Ear: External ear normal.  Normal ear canal and TM Mouth/Throat: Oropharynx is clear and moist.  Eyes: Conjunctivae and EOM are normal.  Neck: Neck supple. No tracheal deviation present. No thyromegaly present.  No carotid bruit  Cardiovascular: Normal rate, regular rhythm and normal heart sounds.   No murmur heard.  No edema. Pulmonary/Chest: Effort normal and breath sounds normal. No respiratory distress. She has no wheezes. She has no rales.  Breast: deferred   Abdominal: Soft. She exhibits no distension.  There is no tenderness.  Lymphadenopathy: She has no cervical adenopathy.  Skin: Skin is warm and dry. She is not diaphoretic.  Psychiatric: She has a normal mood and affect. Her behavior is normal.        Assessment & Plan:   Physical exam: Screening blood work    ordered Immunizations  Deferred tdap, flu, shingrix Colonoscopy   Up to date  Mammogram  Up to date  Gyn   Up to date    dexa  - [facility] - ordered Eye exams  Up to date  Exercise  Not enough Weight   Advised weight loss Substance abuse  none      See Problem List for Assessment and Plan of chronic medical problems.

## 2021-01-11 ENCOUNTER — Encounter: Payer: Self-pay | Admitting: Internal Medicine

## 2021-01-11 ENCOUNTER — Other Ambulatory Visit: Payer: Self-pay

## 2021-01-11 ENCOUNTER — Ambulatory Visit (INDEPENDENT_AMBULATORY_CARE_PROVIDER_SITE_OTHER): Payer: Medicare HMO | Admitting: Internal Medicine

## 2021-01-11 VITALS — BP 118/80 | HR 66 | Temp 98.1°F | Wt 193.0 lb

## 2021-01-11 DIAGNOSIS — F419 Anxiety disorder, unspecified: Secondary | ICD-10-CM | POA: Diagnosis not present

## 2021-01-11 DIAGNOSIS — E559 Vitamin D deficiency, unspecified: Secondary | ICD-10-CM

## 2021-01-11 DIAGNOSIS — M85859 Other specified disorders of bone density and structure, unspecified thigh: Secondary | ICD-10-CM

## 2021-01-11 DIAGNOSIS — Z0000 Encounter for general adult medical examination without abnormal findings: Secondary | ICD-10-CM

## 2021-01-11 DIAGNOSIS — Z6833 Body mass index (BMI) 33.0-33.9, adult: Secondary | ICD-10-CM

## 2021-01-11 DIAGNOSIS — E6609 Other obesity due to excess calories: Secondary | ICD-10-CM | POA: Diagnosis not present

## 2021-01-11 DIAGNOSIS — I10 Essential (primary) hypertension: Secondary | ICD-10-CM | POA: Diagnosis not present

## 2021-01-11 DIAGNOSIS — R7303 Prediabetes: Secondary | ICD-10-CM

## 2021-01-11 LAB — CBC WITH DIFFERENTIAL/PLATELET
Basophils Absolute: 0 10*3/uL (ref 0.0–0.1)
Basophils Relative: 0.6 % (ref 0.0–3.0)
Eosinophils Absolute: 0.2 10*3/uL (ref 0.0–0.7)
Eosinophils Relative: 3.2 % (ref 0.0–5.0)
HCT: 37.3 % (ref 36.0–46.0)
Hemoglobin: 12.6 g/dL (ref 12.0–15.0)
Lymphocytes Relative: 27.4 % (ref 12.0–46.0)
Lymphs Abs: 2.1 10*3/uL (ref 0.7–4.0)
MCHC: 33.7 g/dL (ref 30.0–36.0)
MCV: 90.8 fl (ref 78.0–100.0)
Monocytes Absolute: 0.5 10*3/uL (ref 0.1–1.0)
Monocytes Relative: 6.6 % (ref 3.0–12.0)
Neutro Abs: 4.7 10*3/uL (ref 1.4–7.7)
Neutrophils Relative %: 62.2 % (ref 43.0–77.0)
Platelets: 285 10*3/uL (ref 150.0–400.0)
RBC: 4.11 Mil/uL (ref 3.87–5.11)
RDW: 13 % (ref 11.5–15.5)
WBC: 7.6 10*3/uL (ref 4.0–10.5)

## 2021-01-11 LAB — LIPID PANEL
Cholesterol: 131 mg/dL (ref 0–200)
HDL: 44.6 mg/dL (ref >39.00)
LDL Cholesterol: 62 mg/dL (ref 0–99)
NonHDL: 86.42
Total CHOL/HDL Ratio: 3
Triglycerides: 122 mg/dL (ref 0.0–149.0)
VLDL: 24.4 mg/dL (ref 0.0–40.0)

## 2021-01-11 LAB — COMPREHENSIVE METABOLIC PANEL
ALT: 20 U/L (ref 0–35)
AST: 18 U/L (ref 0–37)
Albumin: 4.4 g/dL (ref 3.5–5.2)
Alkaline Phosphatase: 75 U/L (ref 39–117)
BUN: 19 mg/dL (ref 6–23)
CO2: 30 mEq/L (ref 19–32)
Calcium: 9.8 mg/dL (ref 8.4–10.5)
Chloride: 101 mEq/L (ref 96–112)
Creatinine, Ser: 1.01 mg/dL (ref 0.40–1.20)
GFR: 58.64 mL/min — ABNORMAL LOW (ref >60.00)
Glucose, Bld: 90 mg/dL (ref 70–99)
Potassium: 3.9 mEq/L (ref 3.5–5.1)
Sodium: 139 mEq/L (ref 135–145)
Total Bilirubin: 0.6 mg/dL (ref 0.2–1.2)
Total Protein: 7.6 g/dL (ref 6.0–8.3)

## 2021-01-11 LAB — VITAMIN D 25 HYDROXY (VIT D DEFICIENCY, FRACTURES): VITD: 24.41 ng/mL — ABNORMAL LOW (ref 30.00–100.00)

## 2021-01-11 LAB — TSH: TSH: 1.24 u[IU]/mL (ref 0.35–4.50)

## 2021-01-11 LAB — HEMOGLOBIN A1C: Hgb A1c MFr Bld: 6.1 % (ref 4.6–6.5)

## 2021-01-11 MED ORDER — LORAZEPAM 0.5 MG PO TABS: 0.5000 mg | ORAL_TABLET | Freq: Two times a day (BID) | ORAL | 0 refills | Status: DC | PRN

## 2021-01-11 NOTE — Assessment & Plan Note (Signed)
Chronic Check a1c Low sugar / carb diet Stressed regular exercise  

## 2021-01-11 NOTE — Assessment & Plan Note (Signed)
Chronic Intermittent, not frequent Takes lorazepam 0.5 mg 1/2 pill daily prn - does not take often Will refill today

## 2021-01-11 NOTE — Assessment & Plan Note (Signed)
Chronic Doing better off the losartan and spironolactone BP well controlled - probably too controlled - 104/70 at home Continue amlodipine 5 mg qd and bystolic 5mg  daily -- advised if BP stays low she can experiment with doses to decrease and see what she needs to keep bp controlled, but not too controlled cmp

## 2021-01-11 NOTE — Assessment & Plan Note (Signed)
Chronic Stressed regular exercise Healthy diet

## 2021-01-11 NOTE — Assessment & Plan Note (Signed)
Chronic Taking vitamin D daily Check vitamin D level  

## 2021-01-11 NOTE — Assessment & Plan Note (Signed)
Chronic dexa due  -- ordered Taking calcium and vitamin d daily active

## 2021-01-12 ENCOUNTER — Ambulatory Visit: Payer: Medicare HMO | Admitting: Physician Assistant

## 2021-01-12 ENCOUNTER — Ambulatory Visit (HOSPITAL_COMMUNITY)
Admission: RE | Admit: 2021-01-12 | Discharge: 2021-01-12 | Disposition: A | Discharge status: Home / Self Care | Payer: Medicare HMO | Source: Ambulatory Visit | Attending: Vascular Surgery | Admitting: Vascular Surgery

## 2021-01-12 VITALS — BP 117/80 | HR 49 | Temp 98.2°F | Resp 20 | Ht 63.0 in | Wt 191.8 lb

## 2021-01-12 DIAGNOSIS — I8393 Asymptomatic varicose veins of bilateral lower extremities: Secondary | ICD-10-CM | POA: Diagnosis not present

## 2021-01-12 NOTE — Progress Notes (Signed)
[facility]ASC[facility]           OF [place]  History and Physical   Angel Williamson is a 65 y.o. female who was seen by her PCP in February with c/o heavy legs.  They were feeling tight and heavy at times in her upper legs.  She was not having pain walking and her sx worsened as the day progressed.  She was referred to [facility] for evaluation.    The patient has no history of DVT. Pt does have history of varicose vein.   Pt does not have history of skin changes in lower legs.   There is is family history of venous disorders as her mother had terrible varicose veins. The patient has not used compression stockings in the past.    She states that her legs have felt heavy for the past year or so.  She states that the right one is worse than the left and they feel heavy in the thighs.   She recently stopped her spironolactone and ARB and she states that her legs feel a little bit better.   She is semi retired and works a couple of days a week at home at [employer].  She has hx of having children.  She has not had any abdominal surgeries.  She states she does have some superficial varicosities on the LLE around the lateral ankle.  They have not ever bled.  She states that when the u/s was over the vein in her right let, it was sore.  She states that she doesn't have any significant leg swelling.  She does elevate her legs but not so that her ankles are higher than her heart.   There is no family hx of AAA.  The pt is not on a statin for cholesterol management.  The pt is not on a daily aspirin.   Other AC:  none The pt is on CCB, BB for hypertension.   The pt is not diabetic.   Tobacco hx:  former   Past Medical History:  Diagnosis Date  . Breast fibrocystic disorder   . Calcaneus fracture 2010   no surgery  . History of colonic polyps    adenomatous  . Hypertension   . Obesity   . OSA (obstructive sleep apnea)   . Osteopenia     Past Surgical History:  Procedure  Laterality Date  . BREAST CYST ASPIRATION    . COLONOSCOPY  2014   negative  . COLONOSCOPY W/ POLYPECTOMY     adenomatous polyps 2003 & 2007 Due 2012. Dr.Kaplan   . G 2 P 2    . no prior surgery      Social History   Socioeconomic History  . Marital status: Married    Spouse name: Not on file  . Number of children: Not on file  . Years of education: Not on file  . Highest education level: Not on file  Occupational History  . Occupation: Surveyor, minerals  Tobacco Use  . Smoking status: Former Smoker    Packs/day: 1.00    Years: 12.00    Pack years: 12.00    Types: Cigarettes    Quit date: 10/29/1986    Years since quitting: 34.2  . Smokeless tobacco: Never Used  Vaping Use  . Vaping Use: Never used  Substance and Sexual Activity  . Alcohol use: No  . Drug use: No  . Sexual activity: Not on  file  Other Topics Concern  . Not on file  Social History Narrative  . Not on file   Social Determinants of Health   Financial Resource Strain: Not on file  Food Insecurity: Not on file  Transportation Needs: Not on file  Physical Activity: Not on file  Stress: Not on file  Social Connections: Not on file  Intimate Partner Violence: Not on file   Family History  Problem Relation Age of Onset  . Cancer Paternal Grandmother        stomach  . Stomach cancer Paternal Grandmother   . Diabetes Maternal Grandfather   . Stroke Paternal Grandfather 22  . Colon polyps Father   . Anemia Mother   . Anemia Other        Cousin   . Colon cancer Neg Hx   . Esophageal cancer Neg Hx   . Rectal cancer Neg Hx     Current Outpatient Medications  Medication Sig Dispense Refill  . amLODipine (NORVASC) 5 MG tablet Take 1 tablet (5 mg total) by mouth daily. 90 tablet 3  . bisacodyl (DULCOLAX) 10 MG suppository Place 1 suppository (10 mg total) rectally as needed for moderate constipation. 12 suppository 0  . Calcium Carb-Cholecalciferol (CALCIUM 1000 + D PO) Take by mouth daily.    .  cholecalciferol (VITAMIN D) 1000 UNITS tablet Take 1,000 Units by mouth daily.    . fluticasone (FLONASE) 50 MCG/ACT nasal spray USE 1 SPRAY IN EACH NOSTRIL TWICE A DAY AS NEEDED 16 g 5  . LORazepam (ATIVAN) 0.5 MG tablet Take 1 tablet (0.5 mg total) by mouth 2 (two) times daily as needed for anxiety. 30 tablet 0  . nebivolol (BYSTOLIC) 5 MG tablet Take 1 tablet (5 mg total) by mouth daily. 30 tablet 5   No current facility-administered medications for this visit.    Allergies  Allergen Reactions  . Hydrochlorothiazide     REACTION: low potassium  . Shrimp [Shellfish Allergy]   . Benazepril Hcl     REACTION: cough    REVIEW OF SYSTEMS:   [X]  denotes positive finding, [ ]  denotes negative finding Cardiac  Comments:  Chest pain or chest pressure:    Shortness of breath upon exertion:    Short of breath when lying flat:    Irregular heart rhythm:        Vascular    Pain in calf, thigh, or hip brought on by ambulation:    Pain in feet at night that wakes you up from your sleep:     Blood clot in your veins:    Leg swelling:         Pulmonary    Oxygen at home:    Productive cough:     Wheezing:         Neurologic    Sudden weakness in arms or legs:     Sudden numbness in arms or legs:     Sudden onset of difficulty speaking or slurred speech:    Temporary loss of vision in one eye:     Problems with dizziness:         Gastrointestinal    Blood in stool:     Vomited blood:         Genitourinary    Burning when urinating:     Blood in urine:        Psychiatric    Major depression:         Hematologic    Bleeding problems:  Problems with blood clotting too easily:        Skin    Rashes or ulcers:        Constitutional    Fever or chills:      PHYSICAL EXAMINATION:  Today's Vitals   01/12/21 1352  BP: 117/80  Pulse: (!) 49  Resp: 20  Temp: 98.2 F (36.8 C)  TempSrc: Temporal  SpO2: 100%  Weight: 191 lb 12.8 oz (87 kg)  Height: 5\' 3"  (1.6 m)    Body mass index is 33.98 kg/m.   General:  WDWN in NAD; vital signs documented above Gait: Not observed HENT: WNL, normocephalic Pulmonary: normal non-labored breathing without wheezing Cardiac: regular HR; without carotid bruits Abdomen: obese Skin: without rashes Vascular Exam/Pulses:  Right Left  Radial 2+ (normal) 2+ (normal)  DP 2+ (normal) 2+ (normal)  PT Unable to palpate Unable to palpate   Extremities: without ischemic changes, without cellulitis; without open wounds; she has superficial cluster of spider veins on the lateral left leg above the ankle.  Superficial varicosity right gsv in the thigh Musculoskeletal: no muscle wasting or atrophy  Neurologic: A&O X 3;  moving all extremities equally Psychiatric:  The pt has Normal affect.   Non-Invasive Vascular Imaging:   Venous duplex on 01/12/2021: Venous Reflux Times  +--------------+---------+------+-----------+------------+--------+  RIGHT     Reflux NoRefluxReflux TimeDiameter cmsComments               Yes                   +--------------+---------+------+-----------+------------+--------+  CFV            yes  >1 second             +--------------+---------+------+-----------+------------+--------+  FV prox          yes  >1 second             +--------------+---------+------+-----------+------------+--------+  FV mid          yes  >1 second             +--------------+---------+------+-----------+------------+--------+  FV dist          yes  >1 second             +--------------+---------+------+-----------+------------+--------+  Popliteal   no                         +--------------+---------+------+-----------+------------+--------+  GSV at SFJ        yes  >500 ms   0.896         +--------------+---------+------+-----------+------------+--------+  GSV prox thigh      yes  >500 ms   0.447        +--------------+---------+------+-----------+------------+--------+  GSV mid thigh       yes  >500 ms   0.356        +--------------+---------+------+-----------+------------+--------+  GSV dist thighno               0.334        +--------------+---------+------+-----------+------------+--------+  GSV at knee        yes  >500 ms   0.355        +--------------+---------+------+-----------+------------+--------+  GSV prox calf                 0.488        +--------------+---------+------+-----------+------------+--------+  GSV mid calf                 0.345        +--------------+---------+------+-----------+------------+--------+  SSV Pop Fossa no               0.141        +--------------+---------+------+-----------+------------+--------+  SSV prox calf no               0.182        +--------------+---------+------+-----------+------------+--------+  SSV mid calf                 0.182        +--------------+---------+------+-----------+------------+--------+     +--------------+---------+------+-----------+------------+--------+  LEFT     Reflux NoRefluxReflux TimeDiameter cmsComments               Yes                   +--------------+---------+------+-----------+------------+--------+  CFV            yes  >1 second             +--------------+---------+------+-----------+------------+--------+  FV prox    no                         +--------------+---------+------+-----------+------------+--------+  FV mid    no                          +--------------+---------+------+-----------+------------+--------+  FV dist    no                         +--------------+---------+------+-----------+------------+--------+  Popliteal   no                         +--------------+---------+------+-----------+------------+--------+  GSV at [facility]  no               0.461        +--------------+---------+------+-----------+------------+--------+  GSV prox thighno               0.297        +--------------+---------+------+-----------+------------+--------+  GSV mid thigh no               0.297        +--------------+---------+------+-----------+------------+--------+  GSV dist thighno               0.302        +--------------+---------+------+-----------+------------+--------+  GSV at knee  no               0.244        +--------------+---------+------+-----------+------------+--------+  GSV prox calf                 0.239        +--------------+---------+------+-----------+------------+--------+  GSV mid calf                 0.207        +--------------+---------+------+-----------+------------+--------+  SSV Pop Fossa no               0.280        +--------------+---------+------+-----------+------------+--------+  SSV prox calf       yes  >500 ms   0.236        +--------------+---------+------+-----------+------------+--------+  SSV mid calf                 0.239        +--------------+---------+------+-----------+------------+--------+   Summary:  Bilateral:  - No evidence of deep vein thrombosis seen in the lower extremities,  bilaterally, from the common femoral through the popliteal veins.  -  No evidence of  superficial venous thrombosis in the lower extremities,  bilaterally.    Right:  - No evidence of superficial venous reflux seen in the right short  saphenous vein.  - Venous reflux is noted in the right common femoral vein.  - Venous reflux is noted in the right sapheno-femoral junction.  - Venous reflux is noted in the right greater saphenous vein in the thigh.  - Venous reflux is noted in the right femoral vein.    Left:  - No evidence of superficial venous reflux seen in the left greater  saphenous vein.  - Venous reflux is noted in the left common femoral vein.  - Venous reflux is noted in the left short saphenous vein.    Angel Williamson is a 65 y.o. female who presents with: heaviness of bilateral thighs with right worse than left.  Pt's u/s today reveals no DVT in either leg.  She does have deep vein reflux in the right thigh and GSV and only venous reflux in the left CFV and SSV.  She does not meet qualifications for laser ablation.   She does have palpable DP pulses bilaterally. -discussed with pt about wearing knee high 15-49mmHg compression stockings to see if she gets any relief.   -discussed the importance of leg elevation and how to elevate properly - pt is advised to elevate their legs and a diagram is given to them to demonstrate to lay flat on their back with knees elevated and slightly bent with their feet higher than her knees, which puts their feet higher than their heart for 15 minutes per day.  If they cannot lay flat, advised to lay as flat as possible.  -pt is advised to continue as much walking as possible and avoid sitting or standing for long periods of time.  -discussed importance of weight loss and exercise and that water aerobics would also be beneficial.  -handout with recommendations given -discussed with her that if her spider veins ever bled, to elevate leg and hold gentle pressure for about 15-30 minutes.  Discussed sclerotherapy for spider veins but she  does not want that at this time.  -pt will f/u as needed.     Leontine Locket, [facility] 01/12/2021 1:08 PM  Clinic MD:  Oneida Alar

## 2021-01-13 ENCOUNTER — Other Ambulatory Visit: Payer: Self-pay | Admitting: Internal Medicine

## 2021-01-23 DIAGNOSIS — G4733 Obstructive sleep apnea (adult) (pediatric): Secondary | ICD-10-CM | POA: Diagnosis not present

## 2021-01-24 ENCOUNTER — Other Ambulatory Visit: Payer: Self-pay | Admitting: Internal Medicine

## 2021-01-24 DIAGNOSIS — I10 Essential (primary) hypertension: Secondary | ICD-10-CM

## 2021-01-26 ENCOUNTER — Ambulatory Visit
Admission: RE | Admit: 2021-01-26 | Discharge: 2021-01-26 | Disposition: A | Discharge status: Home / Self Care | Payer: Medicare HMO | Source: Ambulatory Visit | Attending: Internal Medicine | Admitting: Internal Medicine

## 2021-01-26 ENCOUNTER — Other Ambulatory Visit: Payer: Self-pay

## 2021-01-26 DIAGNOSIS — Z780 Asymptomatic menopausal state: Secondary | ICD-10-CM | POA: Diagnosis not present

## 2021-01-26 DIAGNOSIS — M85859 Other specified disorders of bone density and structure, unspecified thigh: Secondary | ICD-10-CM

## 2021-01-26 DIAGNOSIS — M8588 Other specified disorders of bone density and structure, other site: Secondary | ICD-10-CM | POA: Diagnosis not present

## 2021-01-30 ENCOUNTER — Ambulatory Visit (INDEPENDENT_AMBULATORY_CARE_PROVIDER_SITE_OTHER): Payer: Medicare HMO | Admitting: Internal Medicine

## 2021-01-30 ENCOUNTER — Encounter: Payer: Self-pay | Admitting: Internal Medicine

## 2021-01-30 ENCOUNTER — Other Ambulatory Visit: Payer: Self-pay

## 2021-01-30 VITALS — BP 122/72 | HR 65 | Temp 98.6°F | Ht 63.0 in | Wt 192.0 lb

## 2021-01-30 DIAGNOSIS — L239 Allergic contact dermatitis, unspecified cause: Secondary | ICD-10-CM | POA: Insufficient documentation

## 2021-01-30 DIAGNOSIS — I10 Essential (primary) hypertension: Secondary | ICD-10-CM

## 2021-01-30 MED ORDER — TACROLIMUS 0.1 % EX OINT: TOPICAL_OINTMENT | Freq: Two times a day (BID) | CUTANEOUS | 0 refills | Status: DC

## 2021-01-30 MED ORDER — AMLODIPINE BESYLATE 5 MG PO TABS: 5.0000 mg | ORAL_TABLET | Freq: Every day | ORAL | 3 refills | Status: DC

## 2021-01-30 MED ORDER — NEBIVOLOL HCL 5 MG PO TABS: 5.0000 mg | ORAL_TABLET | Freq: Every day | ORAL | 5 refills | Status: DC

## 2021-01-30 NOTE — Progress Notes (Signed)
Subjective:    Patient ID: Angel Williamson, female    DOB: 1955-12-08, 65 y.o.   MRN: [id number]  HPI The patient is here for an acute visit.   Her BP has been too low.  She was taking both but that was too much.  She took just one and it was still too much. She tried a 1/2 of one and then the other - her BP was still too low.  For the past three days she has not taken either one.   She also developed a rash on her face - both checks.  it is a little itchy and she was wondering if that was from the amlodipine- it started when she had restarted this after stopping it.  She did not have it when she first started it last month.  She does wear a cpap, but has never had issues with that.  This is the rash she often gets with eating seafood - she did not eat any seafood.  She has been taking benadryl and it helps, but it has not gone away.    Still has tingling throughout arms and legs.  Had covid booster - not sure if that was the cause.  Better overall.  She thought it was related to her old BP meds, but still there.     Medications and allergies reviewed with patient and updated if appropriate.  Patient Active Problem List   Diagnosis Date Noted  . Vitamin D deficiency 01/10/2021  . Varicose veins of both lower extremities 12/06/2020  . Mid back pain on left side 06/27/2020  . Dizziness 10/05/2019  . Constipation 10/10/2018  . Right-sided low back pain without sciatica 09/23/2018  . Rectal discharge 09/23/2018  . Anxiety 07/22/2017  . OSA (obstructive sleep apnea), Severe 08/22/2016  . Prediabetes 01/11/2016  . Obesity 01/11/2016  . Skin cancer, basal cell 04/18/2012  . COLONIC POLYPS, HX OF 12/13/2009  . Osteopenia 03/29/2009  . Essential hypertension 11/14/2007    Current Outpatient Medications on File Prior to Visit  Medication Sig Dispense Refill  . amLODipine (NORVASC) 5 MG tablet Take 1 tablet (5 mg total) by mouth daily. 90 tablet 3  . bisacodyl (DULCOLAX) 10 MG  suppository Place 1 suppository (10 mg total) rectally as needed for moderate constipation. 12 suppository 0  . Calcium Carb-Cholecalciferol (CALCIUM 1000 + D PO) Take by mouth daily.    . cholecalciferol (VITAMIN D) 1000 UNITS tablet Take 1,000 Units by mouth daily.    . fluticasone (FLONASE) 50 MCG/ACT nasal spray USE 1 SPRAY IN EACH NOSTRIL TWICE A DAY AS NEEDED 16 g 5  . LORazepam (ATIVAN) 0.5 MG tablet Take 1 tablet (0.5 mg total) by mouth 2 (two) times daily as needed for anxiety. 30 tablet 0  . nebivolol (BYSTOLIC) 5 MG tablet Take 1 tablet (5 mg total) by mouth daily. (Patient not taking: Reported on 01/30/2021) 30 tablet 5   No current facility-administered medications on file prior to visit.    Past Medical History:  Diagnosis Date  . Breast fibrocystic disorder   . Calcaneus fracture 2010   no surgery  . History of colonic polyps    adenomatous  . Hypertension   . Obesity   . OSA (obstructive sleep apnea)   . Osteopenia     Past Surgical History:  Procedure Laterality Date  . BREAST CYST ASPIRATION    . COLONOSCOPY  2014   negative  . COLONOSCOPY W/ POLYPECTOMY  adenomatous polyps 2003 & 2007 Due 2012. Dr.Kaplan   . G 2 P 2    . no prior surgery      Social History   Socioeconomic History  . Marital status: Married    Spouse name: Not on file  . Number of children: 2  . Years of education: Not on file  . Highest education level: Not on file  Occupational History  . Occupation: Surveyor, minerals  Tobacco Use  . Smoking status: Former Smoker    Packs/day: 1.00    Years: 12.00    Pack years: 12.00    Types: Cigarettes    Quit date: 10/29/1986    Years since quitting: 34.2  . Smokeless tobacco: Never Used  Vaping Use  . Vaping Use: Never used  Substance and Sexual Activity  . Alcohol use: No  . Drug use: No  . Sexual activity: Not on file  Other Topics Concern  . Not on file  Social History Narrative  . Not on file   Social Determinants of Health    Financial Resource Strain: Not on file  Food Insecurity: Not on file  Transportation Needs: Not on file  Physical Activity: Not on file  Stress: Not on file  Social Connections: Not on file    Family History  Problem Relation Age of Onset  . Cancer Paternal Grandmother        stomach  . Stomach cancer Paternal Grandmother   . Diabetes Maternal Grandfather   . Stroke Paternal Grandfather 32  . Colon polyps Father   . Anemia Mother   . Anemia Other        Cousin   . Colon cancer Neg Hx   . Esophageal cancer Neg Hx   . Rectal cancer Neg Hx     Review of Systems  Constitutional: Negative for fever.  Respiratory: Negative for shortness of breath.   Cardiovascular: Negative for chest pain, palpitations and leg swelling.  Skin: Positive for rash.  Neurological: Negative for light-headedness and headaches.       Objective:   Vitals:   01/30/21 1024  BP: 122/72  Pulse: 65  Temp: 98.6 F (37 C)  SpO2: 96%   BP Readings from Last 3 Encounters:  01/30/21 122/72  01/12/21 117/80  01/11/21 118/80   Wt Readings from Last 3 Encounters:  01/30/21 192 lb (87.1 kg)  01/12/21 191 lb 12.8 oz (87 kg)  01/11/21 193 lb (87.5 kg)   Body mass index is 34.01 kg/m.   Physical Exam Constitutional:      General: She is not in acute distress.    Appearance: Normal appearance. She is not ill-appearing.  HENT:     Head: Normocephalic and atraumatic.  Cardiovascular:     Rate and Rhythm: Normal rate and regular rhythm.     Heart sounds: No murmur heard.   Pulmonary:     Effort: Pulmonary effort is normal. No respiratory distress.     Breath sounds: No wheezing or rales.  Musculoskeletal:     Right lower leg: No edema.  Skin:    General: Skin is warm and dry.     Findings: Rash (b/l cheeks - maculopular rash) present.  Neurological:     Mental Status: She is alert.          Assessment & Plan:    See Problem List for Assessment and Plan of chronic medical  problems.    This visit occurred during the SARS-CoV-2 public health emergency.  Safety  protocols were in place, including screening questions prior to the visit, additional usage of staff PPE, and extensive cleaning of exam room while observing appropriate contact time as indicated for disinfecting solutions.

## 2021-01-30 NOTE — Patient Instructions (Addendum)
Try the protopic cream on your face twice daily for as short of a time period as possible.    Hold your BP medications for now   A referral was ordered for an allergist.

## 2021-01-30 NOTE — Assessment & Plan Note (Signed)
Acute ? Related to food allergy ( has similar rash with seafood), related to cpap strap, related to amlodipine protopic bid for rash Would like to see allergist - would like to know for sure if she is allergic to seafood and if she has other allergies Doubt it is related to amlodipine - may try medication again Call if no improvement

## 2021-01-30 NOTE — Assessment & Plan Note (Signed)
Chronic bp was on low side - off both medications x 3 days and BP looks good today Hold amlodipine and bystolic for now --- monitor BP at home -if BP elevated can try 1/2 of amlodipine or 1/2 of bystolic to see how she does She will let me know questions/concerns

## 2021-02-03 ENCOUNTER — Telehealth: Payer: Self-pay | Admitting: Internal Medicine

## 2021-02-03 MED ORDER — METRONIDAZOLE 0.75 % EX GEL: 1.0000 "application " | Freq: Two times a day (BID) | CUTANEOUS | 0 refills | Status: DC

## 2021-02-03 NOTE — Telephone Encounter (Signed)
Possible rosacea of the face - lets try an antibacterial gel.  This was sent to [facility].  If this does not work she may need to see dermatology.  Have her stop the other ointment for now.

## 2021-02-03 NOTE — Telephone Encounter (Signed)
Patient has called wanting to update Dr.Burns on a few things.  Pt BP is doing well post being off the medications. Her face is still breaking out with redness. Arms & legs still tingling.  She is still going to her visit with the allergy specialist to get a further evaluation.  For any response or advice, patient says she is fine with just getting a MyChart message

## 2021-02-03 NOTE — Telephone Encounter (Signed)
Spoke with patient today. 

## 2021-02-06 ENCOUNTER — Ambulatory Visit: Payer: Medicare HMO | Admitting: Primary Care

## 2021-02-06 ENCOUNTER — Encounter: Payer: Self-pay | Admitting: Primary Care

## 2021-02-06 ENCOUNTER — Other Ambulatory Visit: Payer: Self-pay

## 2021-02-06 DIAGNOSIS — L259 Unspecified contact dermatitis, unspecified cause: Secondary | ICD-10-CM | POA: Insufficient documentation

## 2021-02-06 DIAGNOSIS — G4733 Obstructive sleep apnea (adult) (pediatric): Secondary | ICD-10-CM

## 2021-02-06 DIAGNOSIS — R202 Paresthesia of skin: Secondary | ICD-10-CM | POA: Diagnosis not present

## 2021-02-06 NOTE — Progress Notes (Signed)
Reviewed and agree with assessment/plan.   Chesley Mires, MD [facility] Pulmonary/Critical Care 02/06/2021, 12:20 PM Pager:  [phone]

## 2021-02-06 NOTE — Assessment & Plan Note (Addendum)
-   Patient reports tingling arms/legs since January 2022, worse with CPAP use. Neuro's are intact. She is following with Cardiology for blood pressure management. Continue to encourage CPAP use as her OSA is severe. She may consider inspire device. Recommend adjusting CPAP pressure 5-12cm h20. Due for new machine in August/September.

## 2021-02-06 NOTE — Patient Instructions (Addendum)
You can look into something called inspire device  You are likely due for new CPAP machine in August/September 2022   Referral: [place] [facility] re: paresthesia   Orders: Change CPAP pressure 5-12cm h20 Needs philips Respironics dream wear- nasal sling mask  Follow-up As needed with Dr. Halford Chessman or APP    CPAP and BPAP Information CPAP and BPAP are methods that use air pressure to keep your airways open and to help you breathe well. CPAP and BPAP use different amounts of pressure. Your health care provider will tell you whether CPAP or BPAP would be more helpful for you.  CPAP stands for "continuous positive airway pressure." With CPAP, the amount of pressure stays the same while you breathe in and out.  BPAP stands for "bi-level positive airway pressure." With BPAP, the amount of pressure will be higher when you breathe in (inhale) and lower when you breathe out(exhale). This allows you to take larger breaths. CPAP or BPAP may be used in the hospital, or your health care provider may want you to use it at home. You may need to have a sleep study before your health care provider can order a machine for you to use at home. Why are CPAP and BPAP treatments used? CPAP or BPAP can be helpful if you have:  Sleep apnea.  Chronic obstructive pulmonary disease (COPD).  Heart failure.  Medical conditions that cause muscle weakness, including muscular dystrophy or amyotrophic lateral sclerosis (ALS).  Other problems that cause breathing to be shallow, weak, abnormal, or difficult. CPAP and BPAP are most commonly used for obstructive sleep apnea (OSA) to keep the airways from collapsing when the muscles relax during sleep. How is CPAP or BPAP administered? Both CPAP and BPAP are provided by a small machine with a flexible plastic tube that attaches to a plastic mask that you wear. Air is blown through the mask into your nose or mouth. The amount of pressure that is used to blow the air can be  adjusted on the machine. Your health care provider will set the pressure setting and help you find the best mask for you. When should CPAP or BPAP be used? In most cases, the mask only needs to be worn during sleep. Generally, the mask needs to be worn throughout the night and during any daytime naps. People with certain medical conditions may also need to wear the mask at other times when they are awake. Follow instructions from your health care provider about when to use the machine. What are some tips for using the mask?  Because the mask needs to be snug, some people feel trapped or closed-in (claustrophobic) when first using the mask. If you feel this way, you may need to get used to the mask. One way to do this is to hold the mask loosely over your nose or mouth and then gradually apply the mask more snugly. You can also gradually increase the amount of time that you use the mask.  Masks are available in various types and sizes. If your mask does not fit well, talk with your health care provider about getting a different one. Some common types of masks include: ? Full face masks, which fit over the mouth and nose. ? Nasal masks, which fit over the nose. ? Nasal pillow or prong masks, which fit into the nostrils.  If you are using a mask that fits over your nose and you tend to breathe through your mouth, a chin strap may be applied to  help keep your mouth closed.  Some CPAP and BPAP machines have alarms that may sound if the mask comes off or develops a leak.  If you have trouble with the mask, it is very important that you talk with your health care provider about finding a way to make the mask easier to tolerate. Do not stop using the mask. There could be a negative impact to your health if you stop using the mask.   What are some tips for using the machine?  Place your CPAP or BPAP machine on a secure table or stand near an electrical outlet.  Know where the on/off switch is on the  machine.  Follow instructions from your health care provider about how to set the pressure on your machine and when you should use it.  Do not eat or drink while the CPAP or BPAP machine is on. Food or fluids could get pushed into your lungs by the pressure of the CPAP or BPAP.  For home use, CPAP and BPAP machines can be rented or purchased through home health care companies. Many different brands of machines are available. Renting a machine before purchasing may help you find out which particular machine works well for you. Your insurance may also decide which machine you may get.  Keep the CPAP or BPAP machine and attachments clean. Ask your health care provider for specific instructions. Follow these instructions at home:  Do not use any products that contain nicotine or tobacco, such as cigarettes, e-cigarettes, and chewing tobacco. If you need help quitting, ask your health care provider.  Keep all follow-up visits as told by your health care provider. This is important. Contact a health care provider if:  You have redness or pressure sores on your head, face, mouth, or nose from the mask or head gear.  You have trouble using the CPAP or BPAP machine.  You cannot tolerate wearing the CPAP or BPAP mask.  Someone tells you that you snore even when wearing your CPAP or BPAP. Get help right away if:  You have trouble breathing.  You feel confused. Summary  CPAP and BPAP are methods that use air pressure to keep your airways open and to help you breathe well.  You may need to have a sleep study before your health care provider can order a machine for home use.  If you have trouble with the mask, it is very important that you talk with your health care provider about finding a way to make the mask easier to tolerate. Do not stop using the mask. There could be a negative impact to your health if you stop using the mask.  Follow instructions from your health care provider about when  to use the machine. This information is not intended to replace advice given to you by your health care provider. Make sure you discuss any questions you have with your health care provider. Document Revised: 11/06/2019 Document Reviewed: 11/09/2019 Elsevier Patient Education  2021 [employer].

## 2021-02-06 NOTE — Assessment & Plan Note (Addendum)
-   Non-pruritic facial erythema. Unlikely related to CPAP mask as this has persisted since she stopped wearing her mask. Recommend she look at detergent she is using and type of bedding. She may benefit from silk pillow case. She plans to see allergy. Try philips dream wear nasal sling mask.

## 2021-02-06 NOTE — Assessment & Plan Note (Signed)
-   Refer to neurology re: paresthesia

## 2021-02-06 NOTE — Progress Notes (Signed)
@Patient  ID: Angel Williamson, female    DOB: 09/09/1956, 65 y.o.   MRN: [id number]  Chief Complaint  Patient presents with  . Follow-up    CPAP making tingling in arms and legs worse, redness in face     Referring provider: Binnie Rail, MD  HPI: 65 year old female, never smoker quit 1988 (12-pack-year history).  Past medical history significant for obstructive sleep apnea, hypertension, prediabetes.  Patient of Dr. Halford Chessman, last seen by pulmonary nurse practitioner in February 2022.  During that visit she had excellent CPAP compliance was recommended to follow-up in 1 year.   HST 06/04/16 >> AHI 46.8, SaO2 low 72%  02/06/2021 Patient presents today for 63-month follow-up OSA. She reports developing arm/leg tingling in January 2022, she feels CPAP use has made this worse. She stopped wearing her CPAP 4-5 days ago and reports improvement. She feels a great deal better since being off CPAP and off blood pressure medication. She also reports redness/welts on her face with CPAP mask. She is still having redness in the morning even when not wearing head gear. No known allergy to latex. Redness is not itchy. She was referred to cardiology for blood pressure management. She sleeps on her side. Denies changes in cognition or speech.   Airview download 09/28/2020-10/27/2020: Usage 30/30 days (100%) > 4 hours Average usage 6 hours and 22 minutes Pressure 5-15 cm H2O (10.9 cm H2O- 95%) Air leak 6.3 L/min 995%) AHI 0.7  Allergies  Allergen Reactions  . Hydrochlorothiazide     REACTION: low potassium  . Shrimp [Shellfish Allergy]   . Benazepril Hcl     REACTION: cough    Immunization History  Administered Date(s) Administered  . PFIZER(Purple Top)SARS-COV-2 Vaccination 01/18/2020, 02/08/2020, 09/06/2020    Past Medical History:  Diagnosis Date  . Breast fibrocystic disorder   . Calcaneus fracture 2010   no surgery  . History of colonic polyps    adenomatous  . Hypertension   . Obesity    . OSA (obstructive sleep apnea)   . Osteopenia     Tobacco History: Social History   Tobacco Use  Smoking Status Former Smoker  . Packs/day: 1.00  . Years: 12.00  . Pack years: 12.00  . Types: Cigarettes  . Quit date: 10/29/1986  . Years since quitting: 34.2  Smokeless Tobacco Never Used   Counseling given: Not Answered   Outpatient Medications Prior to Visit  Medication Sig Dispense Refill  . amLODipine (NORVASC) 5 MG tablet Take 1 tablet (5 mg total) by mouth daily. 90 tablet 3  . bisacodyl (DULCOLAX) 10 MG suppository Place 1 suppository (10 mg total) rectally as needed for moderate constipation. 12 suppository 0  . Calcium Carb-Cholecalciferol (CALCIUM 1000 + D PO) Take by mouth daily.    . cholecalciferol (VITAMIN D) 1000 UNITS tablet Take 1,000 Units by mouth daily.    . fluticasone (FLONASE) 50 MCG/ACT nasal spray USE 1 SPRAY IN EACH NOSTRIL TWICE A DAY AS NEEDED 16 g 5  . LORazepam (ATIVAN) 0.5 MG tablet Take 1 tablet (0.5 mg total) by mouth 2 (two) times daily as needed for anxiety. 30 tablet 0  . metroNIDAZOLE (METROGEL) 0.75 % gel Apply 1 application topically 2 (two) times daily. 45 g 0  . nebivolol (BYSTOLIC) 5 MG tablet Take 1 tablet (5 mg total) by mouth daily. 30 tablet 5  . tacrolimus (PROTOPIC) 0.1 % ointment Apply topically 2 (two) times daily. 30 g 0   No facility-administered medications prior to  visit.    Review of Systems  Review of Systems  Constitutional: Negative.   Respiratory: Negative.   Cardiovascular: Negative.   Skin: Negative for color change and rash.  Neurological: Negative for dizziness, speech difficulty, weakness and numbness.       Tingling arms/legs    Physical Exam  BP (!) 144/84 (BP Location: Left Arm, Cuff Size: Normal)   Pulse 69   Temp (!) 97.5 F (36.4 C) (Temporal)   Ht 5\' 2"  (1.575 m)   Wt 190 lb 9.6 oz (86.5 kg)   SpO2 98%   BMI 34.86 kg/m  Physical Exam Constitutional:      Appearance: Normal appearance.   HENT:     Mouth/Throat:     Comments: Deferred d/t masking Cardiovascular:     Rate and Rhythm: Normal rate and regular rhythm.  Pulmonary:     Effort: Pulmonary effort is normal.     Breath sounds: Normal breath sounds.  Musculoskeletal:        General: Normal range of motion.  Skin:    General: Skin is warm and dry.  Neurological:     General: No focal deficit present.     Mental Status: She is alert and oriented to person, place, and time. Mental status is at baseline.     Comments: Neuros intact  Psychiatric:        Mood and Affect: Mood normal.        Behavior: Behavior normal.        Thought Content: Thought content normal.        Judgment: Judgment normal.      Lab Results:  CBC    Component Value Date/Time   WBC 7.6 01/11/2021 1404   RBC 4.11 01/11/2021 1404   HGB 12.6 01/11/2021 1404   HCT 37.3 01/11/2021 1404   PLT 285.0 01/11/2021 1404   MCV 90.8 01/11/2021 1404   MCHC 33.7 01/11/2021 1404   RDW 13.0 01/11/2021 1404   LYMPHSABS 2.1 01/11/2021 1404   MONOABS 0.5 01/11/2021 1404   EOSABS 0.2 01/11/2021 1404   BASOSABS 0.0 01/11/2021 1404    BMET    Component Value Date/Time   NA 139 01/11/2021 1404   K 3.9 01/11/2021 1404   CL 101 01/11/2021 1404   CO2 30 01/11/2021 1404   GLUCOSE 90 01/11/2021 1404   BUN 19 01/11/2021 1404   CREATININE 1.01 01/11/2021 1404   CALCIUM 9.8 01/11/2021 1404   GFRNONAA 69.37 12/21/2009 1057   GFRAA 75 11/24/2007 1045    BNP No results found for: BNP  ProBNP No results found for: PROBNP  Imaging: DG Bone Density  Result Date: 01/26/2021 EXAM: DUAL X-RAY ABSORPTIOMETRY (DXA) FOR BONE MINERAL DENSITY IMPRESSION: Referring Physician:  [place] Your patient completed a BMD test using Lunar IDXA DXA system ( analysis version: 16 ) manufactured by [employer]. Technologist: KT PATIENT: Name: Angel Williamson, Angel Williamson Patient ID:  [id number] Birth Date: 1955-11-25 Height:     61.7 in. Sex:         Female Measured:    01/26/2021 Weight:     189.4 lbs. Indications: Caucasian, Estrogen Deficient, History of Osteopenia, Postmenopausal, Secondary Osteoporosis Fractures: Femur Treatments: Vitamin D (E933.5) ASSESSMENT: The BMD measured at Femur Neck Left is 0.811 g/cm2 with a T-score of -1.6. This patient is considered OSTEOPENIC according to [facility]) criteria. The scan quality is good. Site Region Measured Date Measured Age YA T-score BMD Significant CHANGE DualFemur Neck Left  01/26/2021  65.0         -1.6    0.811 g/cm2 DualFemur Neck Left  05/18/2016    60.3         -1.6    0.818 g/cm2 AP Spine  L1-L4      01/26/2021    65.0         -0.7    1.101 g/cm2 AP Spine  L1-L4      05/18/2016    60.3         -0.7    1.106 g/cm2 DualFemur Total Mean 01/26/2021    65.0         -1.1    0.866 g/cm2 DualFemur Total Mean 05/18/2016    60.3         -1.1    0.867 g/cm2 World Health Organization [facility]) criteria for post-menopausal, Caucasian Women: Normal       T-score at or above -1 SD Osteopenia   T-score between -1 and -2.5 SD Osteoporosis T-score at or below -2.5 SD RECOMMENDATION: 1. All patients should optimize calcium and vitamin D intake. 2. Consider FDA approved medical therapies in postmenopausal women and men aged 40 years and older, based on the following: a. A hip or vertebral (clinical or morphometric) fracture b. T-score < or = -2.5 at the femoral neck or spine after appropriate evaluation to exclude secondary causes c. Low bone mass (T-score between -1.0 and -2.5 at the femoral neck or spine) and a 10 year probability of a hip fracture > or = 3% or a 10 year probability of a major osteoporosis-related fracture > or = 20% based on the [place]-adapted WHO algorithm d. Clinician judgment and/or patient preferences may indicate treatment for people with 10-year fracture probabilities above or below these levels FOLLOW-UP: Patients with diagnosis of osteoporosis or at high risk for fracture should have regular bone  mineral density tests. For patients eligible for Medicare, routine testing is allowed once every 2 years. The testing frequency can be increased to one year for patients who have rapidly progressing disease, those who are receiving or discontinuing medical therapy to restore bone mass, or have additional risk factors. I have reviewed this report and agree with the above findings. Mark A. Thornton Papas, M.D. [facility] FRAX* 10-year Probability of Fracture Based on femoral neck BMD: DualFemur (Left) Major Osteoporotic Fracture: 8.6% Hip Fracture:                1.0% Population:                  [place] (Caucasian) Risk Factors:                Secondary Osteoporosis *FRAX is a Materials engineer of the [employer] of [employer] for Metabolic Bone Disease, a [place] (WHO) [facility]. ASSESSMENT: The probability of a major osteoporotic fracture is 8.6 % within the next ten years. The probability of a hip fracture is 1.0 % within the next ten years. I have reviewed this report and agree with the above findings. Mark A. Thornton Papas, M.D. [facility] Electronically Signed   By: Lavonia Dana M.D.   On: 01/26/2021 15:34   VAS [place] LOWER EXTREMITY VENOUS REFLUX  Result Date: 01/12/2021  Lower Venous Reflux Study Indications: Swelling, Pain, and Edema. Other Indications: Patient complains of bilateral thigh pain and heaviness. Performing Technologist: Delorise Shiner RVT  Examination Guidelines: A complete evaluation includes B-mode imaging, spectral Doppler, color Doppler, and power Doppler as needed of all  accessible portions of each vessel. Bilateral testing is considered an integral part of a complete examination. Limited examinations for reoccurring indications may be performed as noted. The reflux portion of the exam is performed with the patient in reverse Trendelenburg. Significant venous reflux is defined as >500 ms in the superficial venous system, and >1 second in the deep  venous system.  Venous Reflux Times +--------------+---------+------+-----------+------------+--------+ RIGHT         Reflux NoRefluxReflux TimeDiameter cmsComments                         Yes                                  +--------------+---------+------+-----------+------------+--------+ CFV                     yes   >1 second                      +--------------+---------+------+-----------+------------+--------+ FV prox                 yes   >1 second                      +--------------+---------+------+-----------+------------+--------+ FV mid                  yes   >1 second                      +--------------+---------+------+-----------+------------+--------+ FV dist                 yes   >1 second                      +--------------+---------+------+-----------+------------+--------+ Popliteal     no                                             +--------------+---------+------+-----------+------------+--------+ GSV at SFJ              yes    >500 ms     0.896             +--------------+---------+------+-----------+------------+--------+ GSV prox thigh          yes    >500 ms     0.447             +--------------+---------+------+-----------+------------+--------+ GSV mid thigh           yes    >500 ms     0.356             +--------------+---------+------+-----------+------------+--------+ GSV dist thighno                           0.334             +--------------+---------+------+-----------+------------+--------+ GSV at knee             yes    >500 ms     0.355             +--------------+---------+------+-----------+------------+--------+ GSV prox calf  0.488             +--------------+---------+------+-----------+------------+--------+ GSV mid calf                               0.345             +--------------+---------+------+-----------+------------+--------+ SSV Pop  Fossa no                           0.141             +--------------+---------+------+-----------+------------+--------+ SSV prox calf no                           0.182             +--------------+---------+------+-----------+------------+--------+ SSV mid calf                               0.182             +--------------+---------+------+-----------+------------+--------+  +--------------+---------+------+-----------+------------+--------+ LEFT          Reflux NoRefluxReflux TimeDiameter cmsComments                         Yes                                  +--------------+---------+------+-----------+------------+--------+ CFV                     yes   >1 second                      +--------------+---------+------+-----------+------------+--------+ FV prox       no                                             +--------------+---------+------+-----------+------------+--------+ FV mid        no                                             +--------------+---------+------+-----------+------------+--------+ FV dist       no                                             +--------------+---------+------+-----------+------------+--------+ Popliteal     no                                             +--------------+---------+------+-----------+------------+--------+ GSV at SFJ    no                           0.461             +--------------+---------+------+-----------+------------+--------+ GSV prox thighno  0.297             +--------------+---------+------+-----------+------------+--------+ GSV mid thigh no                           0.297             +--------------+---------+------+-----------+------------+--------+ GSV dist thighno                           0.302             +--------------+---------+------+-----------+------------+--------+ GSV at knee   no                           0.244              +--------------+---------+------+-----------+------------+--------+ GSV prox calf                              0.239             +--------------+---------+------+-----------+------------+--------+ GSV mid calf                               0.207             +--------------+---------+------+-----------+------------+--------+ SSV Pop Fossa no                           0.280             +--------------+---------+------+-----------+------------+--------+ SSV prox calf           yes    >500 ms     0.236             +--------------+---------+------+-----------+------------+--------+ SSV mid calf                               0.239             +--------------+---------+------+-----------+------------+--------+   Summary: Bilateral: - No evidence of deep vein thrombosis seen in the lower extremities, bilaterally, from the common femoral through the popliteal veins. - No evidence of superficial venous thrombosis in the lower extremities, bilaterally.  Right: - No evidence of superficial venous reflux seen in the right short saphenous vein. - Venous reflux is noted in the right common femoral vein. - Venous reflux is noted in the right sapheno-femoral junction. - Venous reflux is noted in the right greater saphenous vein in the thigh. - Venous reflux is noted in the right femoral vein.  Left: - No evidence of superficial venous reflux seen in the left greater saphenous vein. - Venous reflux is noted in the left common femoral vein. - Venous reflux is noted in the left short saphenous vein.  *See table(s) above for measurements and observations. Electronically signed by Ruta Hinds MD on 01/12/2021 at 1:57:49 PM.    Final      Assessment & Plan:   OSA (obstructive sleep apnea), Severe - Patient reports tingling arms/legs since January 2022, worse with CPAP use. Neuro's are intact. She is following with Cardiology for blood pressure management. Continue to encourage CPAP  use as her OSA is severe. She may consider inspire device. Recommend adjusting CPAP pressure 5-12cm h20. Due for new machine in August/September.  Paresthesia - Refer to  neurology re: paresthesia   Contact dermatitis - Non-pruritic facial erythema. Unlikely related to CPAP mask as this has persisted since she stopped wearing her mask. Recommend she look at detergent she is using and type of bedding. She may benefit from silk pillow case. She plans to see allergy. Try philips dream wear nasal sling mask.    Martyn Ehrich, NP 02/06/2021

## 2021-02-28 ENCOUNTER — Encounter: Payer: Self-pay | Admitting: Allergy

## 2021-02-28 ENCOUNTER — Ambulatory Visit: Payer: Medicare HMO | Admitting: Allergy

## 2021-02-28 ENCOUNTER — Other Ambulatory Visit: Payer: Self-pay

## 2021-02-28 VITALS — BP 154/90 | HR 76 | Resp 14 | Ht 61.6 in | Wt 188.2 lb

## 2021-02-28 DIAGNOSIS — L2489 Irritant contact dermatitis due to other agents: Secondary | ICD-10-CM | POA: Diagnosis not present

## 2021-02-28 DIAGNOSIS — T50905D Adverse effect of unspecified drugs, medicaments and biological substances, subsequent encounter: Secondary | ICD-10-CM | POA: Diagnosis not present

## 2021-02-28 DIAGNOSIS — T781XXD Other adverse food reactions, not elsewhere classified, subsequent encounter: Secondary | ICD-10-CM | POA: Diagnosis not present

## 2021-02-28 DIAGNOSIS — T7809XD Anaphylactic reaction due to other food products, subsequent encounter: Secondary | ICD-10-CM

## 2021-02-28 MED ORDER — EPINEPHRINE 0.3 MG/0.3ML IJ SOAJ: INTRAMUSCULAR | 3 refills | Status: DC

## 2021-02-28 NOTE — Progress Notes (Signed)
New Patient Note  RE: Angel Williamson MRN: [id number] DOB: 12/04/1955 Date of Office Visit: 02/28/2021  Referring provider: Binnie Rail, MD Primary care provider: Binnie Rail, MD  Chief Complaint: Food allergy   History of present illness: Angel Williamson is a 65 y.o. female presenting today for evaluation of food allergy and adverse vaccine effect.  Low histamine  She reports with shellfish ingestion (shrimp) she has had lip swelling and a rash on her neck.  She has taken benadryl that helps with this reaction.  This occurred several years ago for the first time.  She stopped eating shellfish.  She states even when she has [place] that [place] shellfish she has developed symptoms and was concerned for cross-contamination.  She does not avoid any other foods.   She believes the [employer] booster has caused her trouble.  She got her booster in November 2021 in right arm.  In January 2022 states she started developing tingling around the injection site and went down her arm and right leg then started noticed in left arm and leg.  She has continued to have some degree of tingling since. She states she was on the same BP meds for about 20 years (losartan and spironolactone).  When she developed the tingling in her extremities the BP meds were changed to nebivolol and amlodipine which made her BP drop too low.  She also states that she was starting to develop a rash around her cheeks where her CPAP strap would lay on her face.  She states she would get a red rash on her cheeks.  She states the rash would be gone by the end of the morning.  She did tell her pulmonologist about the CPAP issue and is set to have another CPAP device set up for her later today.  She is not sure if this too may be related to the booster. She did stop these medications and her CPAP for the last month or so and she states she feels better.  The tingling is not completely gone since  stopping cpap and the bp meds but states it is better.  She states she has been referred to neurology but has not heard about scheduling this visit yet.   Review of systems in the past 4 weeks: Review of Systems  Constitutional: Negative.   HENT: Negative.   Eyes: Negative.   Respiratory: Negative.   Cardiovascular: Negative.   Gastrointestinal: Negative.   Musculoskeletal: Negative.   Skin: Negative.   Neurological: Positive for tingling.    All other systems negative unless noted above in HPI  Past medical history: Past Medical History:  Diagnosis Date  . Breast fibrocystic disorder   . Calcaneus fracture 2010   no surgery  . History of colonic polyps    adenomatous  . Hypertension   . Obesity   . OSA (obstructive sleep apnea)   . Osteopenia     Past surgical history: Past Surgical History:  Procedure Laterality Date  . BREAST CYST ASPIRATION    . COLONOSCOPY  2014   negative  . COLONOSCOPY W/ POLYPECTOMY     adenomatous polyps 2003 & 2007 Due 2012. Dr.Kaplan   . G 2 P 2    . no prior surgery      Family history:  Family History  Problem Relation Age of Onset  . Cancer Paternal Grandmother        stomach  . Stomach  cancer Paternal Grandmother   . Diabetes Maternal Grandfather   . Stroke Paternal Grandfather 56  . Colon polyps Father   . Anemia Mother   . Anemia Other        Cousin   . Asthma Son   . Colon cancer Neg Hx   . Esophageal cancer Neg Hx   . Rectal cancer Neg Hx     Social history: Lives in a home with carpeting with heat pump heating and cooling.  No pets in the home.  There are dogs and cats outside the home.  There is no concern for water damage, mildew or roaches in the home.  She has an Surveyor, minerals.  She has a smoking history from 1975-988 1 pack cigarettes/day  Medication List: Current Outpatient Medications  Medication Sig Dispense Refill  . EPINEPHrine 0.3 mg/0.3 mL IJ SOAJ injection Use as directed for life-threatening  allergic reaction. 2 each 3  . amLODipine (NORVASC) 5 MG tablet Take 1 tablet (5 mg total) by mouth daily. (Patient not taking: Reported on 02/28/2021) 90 tablet 3  . nebivolol (BYSTOLIC) 5 MG tablet Take 1 tablet (5 mg total) by mouth daily. (Patient not taking: Reported on 02/28/2021) 30 tablet 5   No current facility-administered medications for this visit.    Known medication allergies: Allergies  Allergen Reactions  . Hydrochlorothiazide     REACTION: low potassium  . Losartan Potassium     Muscle Weakness  . Shrimp [Shellfish Allergy]   . Spironolactone Other (See Comments)    Muscle Weakness, Breast Swelling  . Benazepril Hcl     REACTION: cough     Physical examination: Blood pressure (!) 154/90, pulse 76, resp. rate 14, height 5' 1.6" (1.565 m), weight 188 lb 3.2 oz (85.4 kg), SpO2 98 %.  General: Alert, interactive, in no acute distress. HEENT: PERRLA, TMs pearly gray, turbinates non-edematous without discharge, post-pharynx non erythematous. Neck: Supple without lymphadenopathy. Lungs: Clear to auscultation without wheezing, rhonchi or rales. {no increased work of breathing. CV: Normal S1, S2 without murmurs. Abdomen: Nondistended, nontender. Skin: Warm and dry, without lesions or rashes. Extremities:  No clubbing, cyanosis or edema. Neuro:   Grossly intact.  Diagnositics/Labs: Allergy testing: Skin prick testing to shellfish was positive to crab, lobster and oyster Allergy testing results were read and interpreted by provider, documented by clinical staff.   Assessment and plan: Food allergy  -Skin testing to shellfish panel is positive to crab, lobster and oyster -Will obtain shellfish panel.  This will help determine if you may be able to eat shellfish in the future  -Continue avoidance of shellfish.  Discussed shellfish can be airborne and cause reactions from inhalation -Have access to self-injectable epinephrine (Epipen or AuviQ) 0.3mg  at all times -Follow  emergency action plan in case of allergic reaction  Adverse medication effect ?  Contact dermatitis -Discussed today I do not believe her tingling or facial rash are related to IgE mediated events from the COVID-vaccine booster.  The onset of symptoms is not consistent with an IgE mediated medication reaction.  I also am not sure if the events are related at all to the COVID-vaccine booster.  At this time I do not see any contraindications for her to not receive any further COVID-vaccine boosters or doses in the future however she will need to weigh her benefits versus the risks. -Advised she may have developed a contact dermatitis to the strap of her CPAP device.  She is getting a new device today  and thus this might not relieve any symptoms related to her previous device.  Discussed with contact dermatitis the method of testing is patch testing.  I did recommend that she can always try wearing her old CPAP with strap device to see if she does develop an eczematous like rash that would be confirmatory that she is sensitive to any particular strap on her old device.  Also discussed that if she is reactive to the old strep that she could either apply barrier ointment to her face or to wrap the straps so that she is not coming into contact with the allergenic component. -Agree with neurology evaluation for the tingling -Discussed she should talk with her PCP in regards to the need for blood pressure medications.  I do not have a contraindication for her to not use either of the blood pressure medication she has used in the past whether the losartan, spironolactone, nebivolol or amlodipine from a IgE mediated drug allergy standpoint.  Follow-up in 1 year or sooner if needed  I appreciate the opportunity to take part in Angel Williamson's care. Please do not hesitate to contact me with questions.  Sincerely,   Prudy Feeler, MD Allergy/Immunology [facility]

## 2021-02-28 NOTE — Patient Instructions (Addendum)
-  Skin testing to shellfish panel is positive to crab, lobster and oyster -Will obtain shellfish panel.  This will help determine if you may be able to eat shellfish in the future  -Continue avoidance of shellfish.  Discussed shellfish can be airborne and cause reactions from inhalation -Have access to self-injectable epinephrine (Epipen or AuviQ) 0.3mg  at all times -Follow emergency action plan in case of allergic reaction  -Discussed today I do not believe her tingling or facial rash are related to IgE mediated events from the COVID-vaccine booster.  The onset of symptoms is not consistent with an IgE mediated medication reaction.  I also am not sure if the events are related at all to the COVID-vaccine booster.  At this time I do not see any contraindications for her to not receive any further COVID-vaccine boosters or doses in the future however she will need to weigh her benefits versus the risks. -Advised she may have developed a contact dermatitis to the strap of her CPAP device.  She is getting a new device today and thus this might not relieve any symptoms related to her previous device.  Discussed with contact dermatitis the method of testing is patch testing.  I did recommend that she can always try wearing her old CPAP with strap device to see if she does develop an eczematous like rash that would be confirmatory that she is sensitive to any particular strap on her old device.  Also discussed that if she is reactive to the old strep that she could either apply barrier ointment to her face or to wrap the straps so that she is not coming into contact with the allergenic component. -Agree with neurology evaluation for the tingling -Discussed she should talk with her PCP in regards to the need for blood pressure medications.  I do not have a contraindication for her to not use either of the blood pressure medication she has used in the past whether the losartan, spironolactone, nebivolol or  amlodipine from a IgE mediated drug allergy standpoint.  Follow-up in 1 year or sooner if needed

## 2021-03-06 DIAGNOSIS — G4733 Obstructive sleep apnea (adult) (pediatric): Secondary | ICD-10-CM | POA: Diagnosis not present

## 2021-03-07 ENCOUNTER — Telehealth: Payer: Self-pay | Admitting: Primary Care

## 2021-03-07 DIAGNOSIS — R202 Paresthesia of skin: Secondary | ICD-10-CM

## 2021-03-07 NOTE — Telephone Encounter (Signed)
Called and spoke with pt letting her know what happened during last OV and stated to her that I did place referral to neurology. Pt verbalized understanding. Nothing further needed.

## 2021-03-07 NOTE — Telephone Encounter (Signed)
Pt returning a phone call. Pt can be reached at [phone]

## 2021-03-07 NOTE — Telephone Encounter (Signed)
Patient Instructions by Martyn Ehrich, NP at 02/06/2021 10:00 AM  Author: Martyn Ehrich, NP Author Type: Nurse Practitioner Filed: 02/06/2021 10:25 AM  Note Status: Addendum Cosign: Cosign Not Required Encounter Date: 02/06/2021  Editor: Martyn Ehrich, NP (Nurse Practitioner)      Prior Versions: 1. Martyn Ehrich, NP (Nurse Practitioner) at 02/06/2021 10:17 AM - Addendum   2. Martyn Ehrich, NP (Nurse Practitioner) at 02/06/2021 10:15 AM - Signed     You can look into something called inspire device  You are likely due for new CPAP machine in August/September 2022   Referral: [facility] re: paresthesia   Orders: Change CPAP pressure 5-12cm h20 Needs philips Respironics dream wear- nasal sling mask  Follow-up As needed with Dr. Halford Chessman or APP        No referral order was placed at pt's last visit with [facility]. I have taken care of placing the order for the referral to neurology.  Attempted to call pt to discuss this with pt but unable to reach. Left message for her to return call.

## 2021-03-29 DIAGNOSIS — Z8616 Personal history of COVID-19: Secondary | ICD-10-CM

## 2021-03-29 HISTORY — DX: Personal history of COVID-19: Z86.16

## 2021-04-10 DIAGNOSIS — B349 Viral infection, unspecified: Secondary | ICD-10-CM | POA: Diagnosis not present

## 2021-04-10 DIAGNOSIS — R070 Pain in throat: Secondary | ICD-10-CM | POA: Diagnosis not present

## 2021-04-10 DIAGNOSIS — Z20828 Contact with and (suspected) exposure to other viral communicable diseases: Secondary | ICD-10-CM | POA: Diagnosis not present

## 2021-05-19 DIAGNOSIS — Z01419 Encounter for gynecological examination (general) (routine) without abnormal findings: Secondary | ICD-10-CM | POA: Diagnosis not present

## 2021-05-29 ENCOUNTER — Encounter: Payer: Self-pay | Admitting: Diagnostic Neuroimaging

## 2021-05-29 ENCOUNTER — Ambulatory Visit: Payer: Medicare HMO | Admitting: Diagnostic Neuroimaging

## 2021-05-29 VITALS — BP 136/84 | HR 60 | Ht 63.0 in | Wt 185.0 lb

## 2021-05-29 DIAGNOSIS — R202 Paresthesia of skin: Secondary | ICD-10-CM

## 2021-05-29 NOTE — Progress Notes (Signed)
[facility]  PATIENT: Angel Williamson DOB: 12/04/1955  REFERRING CLINICIAN: Martyn Ehrich, NP HISTORY FROM: patient  REASON FOR VISIT: new consult    HISTORICAL  CHIEF COMPLAINT:  Chief Complaint  Patient presents with   Paresthesia    Rm 7 New Pt     HISTORY OF PRESENT ILLNESS:   65 year old female here for evaluation of numbness and tingling.    Symptoms started in January 2022.  Initially she had pain in her right arm with tingling sensation radiating into her right arm and leg.  Then symptoms spread to her left arm and left leg.  Symptoms worse in the morning.  Symptoms worse when she uses her CPAP.  She stopped CPAP and blood pressure medicine for 2 months and symptoms significant improved.  She restarted these treatments and symptoms have returned.  Nowadays patient having intermittent tingling sensations lasting only a few minutes at a time.  Symptoms are better when she is distracted, taking a hot shower or doing activities.  Symptoms seem to be worse when she is feeling anxious or more focused on the symptoms.  Patient had COVID-vaccine booster shot in November 2021 without any significant immediate side effects.  Patient is wondering if current symptoms could be related to vaccine side effects.   REVIEW OF SYSTEMS: Full 14 system review of systems performed and negative with exception of: as per HPI.  ALLERGIES: Allergies  Allergen Reactions   Hydrochlorothiazide     REACTION: low potassium   Losartan Potassium     Muscle Weakness   Shrimp [Shellfish Allergy]    Spironolactone Other (See Comments)    Muscle Weakness, Breast Swelling   Benazepril Hcl     REACTION: cough    HOME MEDICATIONS: Outpatient Medications Prior to Visit  Medication Sig Dispense Refill   amLODipine (NORVASC) 5 MG tablet Take 1 tablet (5 mg total) by mouth daily. 90 tablet 3   Calcium Carbonate (CALCIUM 500 PO) Take 1,000 mg by mouth daily.     Cholecalciferol  (D3 ADULT PO) Take 2,000 Units by mouth daily.     EPINEPHrine 0.3 mg/0.3 mL IJ SOAJ injection Use as directed for life-threatening allergic reaction. 2 each 3   LORazepam (ATIVAN) 0.5 MG tablet Take 0.5 mg by mouth every 8 (eight) hours as needed for anxiety.     nebivolol (BYSTOLIC) 5 MG tablet Take 1 tablet (5 mg total) by mouth daily. (Patient not taking: Reported on 02/28/2021) 30 tablet 5   No facility-administered medications prior to visit.    PAST MEDICAL HISTORY: Past Medical History:  Diagnosis Date   Breast fibrocystic disorder    Calcaneus fracture 2010   no surgery   History of colonic polyps    adenomatous   Hypertension    Obesity    OSA (obstructive sleep apnea)    Osteopenia     PAST SURGICAL HISTORY: Past Surgical History:  Procedure Laterality Date   BREAST CYST ASPIRATION     COLONOSCOPY  2014   negative   COLONOSCOPY W/ POLYPECTOMY     adenomatous polyps 2003 & 2007 Due 2012. Dr.Kaplan    G 2 P 2     no prior surgery      FAMILY HISTORY: Family History  Problem Relation Age of Onset   Anemia Mother    Colon polyps Father    Diabetes Maternal Grandfather    Cancer Paternal Grandmother        stomach   Stomach cancer Paternal  Grandmother    Stroke Paternal Grandfather 29   Asthma Son    Anemia Other        Cousin    Colon cancer Neg Hx    Esophageal cancer Neg Hx    Rectal cancer Neg Hx     SOCIAL HISTORY: Social History   Socioeconomic History   Marital status: Married    Spouse name: Not on file   Number of children: 2   Years of education: Not on file   Highest education level: Associate degree: academic program  Occupational History   Occupation: Surveyor, minerals    Comment: [employer]  Tobacco Use   Smoking status: Former    Packs/day: 1.00    Years: 12.00    Pack years: 12.00    Types: Cigarettes    Quit date: 10/29/1986    Years since quitting: 34.6   Smokeless tobacco: Never  Vaping Use   Vaping Use: Never used   Substance and Sexual Activity   Alcohol use: Never   Drug use: Never   Sexual activity: Not on file  Other Topics Concern   Not on file  Social History Narrative   Lives with husband   Caffeine none since 2015   Social Determinants of Health   Financial Resource Strain: Not on file  Food Insecurity: Not on file  Transportation Needs: Not on file  Physical Activity: Not on file  Stress: Not on file  Social Connections: Not on file  Intimate Partner Violence: Not on file     PHYSICAL EXAM  GENERAL EXAM/CONSTITUTIONAL: Vitals:  Vitals:   05/29/21 1111  BP: 136/84  Pulse: 60  Weight: 185 lb (83.9 kg)  Height: '5\' 3"'$  (1.6 m)   Body mass index is 32.77 kg/m. Wt Readings from Last 3 Encounters:  05/29/21 185 lb (83.9 kg)  02/28/21 188 lb 3.2 oz (85.4 kg)  02/06/21 190 lb 9.6 oz (86.5 kg)   Patient is in no distress; well developed, nourished and groomed; neck is supple  CARDIOVASCULAR: Examination of carotid arteries is normal; no carotid bruits Regular rate and rhythm, no murmurs Examination of peripheral vascular system by observation and palpation is normal  EYES: Ophthalmoscopic exam of optic discs and posterior segments is normal; no papilledema or hemorrhages No results found.  MUSCULOSKELETAL: Gait, strength, tone, movements noted in Neurologic exam below  NEUROLOGIC: MENTAL STATUS:  No flowsheet data found. awake, alert, oriented to person, place and time recent and remote memory intact normal attention and concentration language fluent, comprehension intact, naming intact fund of knowledge appropriate  CRANIAL NERVE:  2nd - no papilledema on fundoscopic exam 2nd, 3rd, 4th, 6th - pupils equal and reactive to light, visual fields full to confrontation, extraocular muscles intact, no nystagmus 5th - facial sensation symmetric 7th - facial strength symmetric 8th - hearing intact 9th - palate elevates symmetrically, uvula midline 11th - shoulder  shrug symmetric 12th - tongue protrusion midline  MOTOR:  normal bulk and tone, full strength in the BUE, BLE  SENSORY:  normal and symmetric to light touch, pinprick, temperature, vibration  COORDINATION:  finger-nose-finger, fine finger movements normal  REFLEXES:  deep tendon reflexes present and symmetric  GAIT/STATION:  narrow based gait     DIAGNOSTIC DATA (LABS, IMAGING, TESTING) - I reviewed patient records, labs, notes, testing and imaging myself where available.  Lab Results  Component Value Date   WBC 7.6 01/11/2021   HGB 12.6 01/11/2021   HCT 37.3 01/11/2021   MCV 90.8  01/11/2021   PLT 285.0 01/11/2021      Component Value Date/Time   NA 139 01/11/2021 1404   K 3.9 01/11/2021 1404   CL 101 01/11/2021 1404   CO2 30 01/11/2021 1404   GLUCOSE 90 01/11/2021 1404   BUN 19 01/11/2021 1404   CREATININE 1.01 01/11/2021 1404   CALCIUM 9.8 01/11/2021 1404   PROT 7.6 01/11/2021 1404   ALBUMIN 4.4 01/11/2021 1404   AST 18 01/11/2021 1404   ALT 20 01/11/2021 1404   ALKPHOS 75 01/11/2021 1404   BILITOT 0.6 01/11/2021 1404   GFRNONAA 69.37 12/21/2009 1057   GFRAA 75 11/24/2007 1045   Lab Results  Component Value Date   CHOL 131 01/11/2021   HDL 44.60 01/11/2021   LDLCALC 62 01/11/2021   TRIG 122.0 01/11/2021   CHOLHDL 3 01/11/2021   Lab Results  Component Value Date   HGBA1C 6.1 01/11/2021   Lab Results  Component Value Date   VITAMINB12 293 10/15/2019   Lab Results  Component Value Date   TSH 1.24 01/11/2021       ASSESSMENT AND PLAN  65 y.o. year old female here with mild intermittent tingling sensation in arms and legs since January 2022.   Dx:  1. Paresthesias      PLAN:  BENIGN PARESTHESIAS (intermittent; slightly worse with CPAP use and anxiety) - overall improving; neuro exam unremarkable; monitor symptoms for now   Return for return to PCP, pending if symptoms worsen or fail to improve.    Penni Bombard, MD  [id number], [id number] AM Certified in Neurology, Neurophysiology and Neuroimaging  [facility] [address], [place], [place] [address] [phone]

## 2021-05-29 NOTE — Patient Instructions (Signed)
  BENIGN PARESTHESIAS (intermittent; slightly worse with CPAP and anxiety) - improving; monitor; neuro exam unremarkable

## 2021-06-02 ENCOUNTER — Encounter: Payer: Self-pay | Admitting: Internal Medicine

## 2021-06-02 ENCOUNTER — Ambulatory Visit (INDEPENDENT_AMBULATORY_CARE_PROVIDER_SITE_OTHER): Payer: Medicare HMO | Admitting: Internal Medicine

## 2021-06-02 ENCOUNTER — Ambulatory Visit (INDEPENDENT_AMBULATORY_CARE_PROVIDER_SITE_OTHER): Payer: Medicare HMO

## 2021-06-02 ENCOUNTER — Other Ambulatory Visit: Payer: Self-pay

## 2021-06-02 VITALS — BP 150/90 | HR 62 | Temp 98.4°F | Ht 63.0 in | Wt 185.0 lb

## 2021-06-02 DIAGNOSIS — R350 Frequency of micturition: Secondary | ICD-10-CM | POA: Diagnosis not present

## 2021-06-02 DIAGNOSIS — R911 Solitary pulmonary nodule: Secondary | ICD-10-CM

## 2021-06-02 DIAGNOSIS — R310 Gross hematuria: Secondary | ICD-10-CM | POA: Diagnosis not present

## 2021-06-02 DIAGNOSIS — R300 Dysuria: Secondary | ICD-10-CM

## 2021-06-02 DIAGNOSIS — R9341 Abnormal radiologic findings on diagnostic imaging of renal pelvis, ureter, or bladder: Secondary | ICD-10-CM | POA: Diagnosis not present

## 2021-06-02 DIAGNOSIS — N3289 Other specified disorders of bladder: Secondary | ICD-10-CM

## 2021-06-02 DIAGNOSIS — IMO0001 Reserved for inherently not codable concepts without codable children: Secondary | ICD-10-CM

## 2021-06-02 DIAGNOSIS — R109 Unspecified abdominal pain: Secondary | ICD-10-CM

## 2021-06-02 DIAGNOSIS — N281 Cyst of kidney, acquired: Secondary | ICD-10-CM

## 2021-06-02 LAB — POC URINALSYSI DIPSTICK (AUTOMATED)
Bilirubin, UA: NEGATIVE
Glucose, UA: NEGATIVE
Ketones, UA: NEGATIVE
Leukocytes, UA: NEGATIVE
Nitrite, UA: NEGATIVE
Protein, UA: NEGATIVE
Spec Grav, UA: 1.015 (ref 1.010–1.025)
Urobilinogen, UA: 0.2 E.U./dL
pH, UA: 6 (ref 5.0–8.0)

## 2021-06-02 MED ORDER — HYDROCODONE-ACETAMINOPHEN 5-325 MG PO TABS: 1.0000 | ORAL_TABLET | Freq: Four times a day (QID) | ORAL | 0 refills | Status: DC | PRN

## 2021-06-02 MED ORDER — TAMSULOSIN HCL 0.4 MG PO CAPS: 0.4000 mg | ORAL_CAPSULE | Freq: Every day | ORAL | 0 refills | Status: DC

## 2021-06-02 NOTE — Progress Notes (Signed)
Subjective:    Patient ID: Angel Williamson, female    DOB: 1956/06/09, 65 y.o.   MRN: [id number]  HPI The patient is here for an acute visit.  Blood in urine - she saw blood in her urine last Thursday, then saw blood again Wednesday , 2 days ago.  She has not seen blood since then.  She does have some increase in urinary frequency, but no dysuria, abdominal pain, nausea, fevers  Left flank discomfort x 2 weeks.  This is not so much pain, but more of a discomfort.  She did take some Tylenol for it and it may have helped.  It can then comes and goes.  It is a little bit lower than when she first felt it.    No h/o of stones.  No recent UTIs  Medications and allergies reviewed with patient and updated if appropriate.  Patient Active Problem List   Diagnosis Date Noted   Paresthesia 02/06/2021   Contact dermatitis 02/06/2021   Allergic dermatitis 01/30/2021   Vitamin D deficiency 01/10/2021   Varicose veins of both lower extremities 12/06/2020   Mid back pain on left side 06/27/2020   Dizziness 10/05/2019   Constipation 10/10/2018   Right-sided low back pain without sciatica 09/23/2018   Rectal discharge 09/23/2018   Anxiety 07/22/2017   OSA (obstructive sleep apnea), Severe 08/22/2016   Prediabetes 01/11/2016   Obesity 01/11/2016   Skin cancer, basal cell 04/18/2012   COLONIC POLYPS, HX OF 12/13/2009   Osteopenia 03/29/2009   Essential hypertension 11/14/2007    Current Outpatient Medications on File Prior to Visit  Medication Sig Dispense Refill   amLODipine (NORVASC) 5 MG tablet Take 1 tablet (5 mg total) by mouth daily. 90 tablet 3   Calcium Carbonate (CALCIUM 500 PO) Take 1,000 mg by mouth daily.     Cholecalciferol (D3 ADULT PO) Take 2,000 Units by mouth daily.     EPINEPHrine 0.3 mg/0.3 mL IJ SOAJ injection Use as directed for life-threatening allergic reaction. 2 each 3   LORazepam (ATIVAN) 0.5 MG tablet Take 0.5 mg by mouth every 8 (eight) hours as needed for  anxiety.     No current facility-administered medications on file prior to visit.    Past Medical History:  Diagnosis Date   Breast fibrocystic disorder    Calcaneus fracture 2010   no surgery   History of colonic polyps    adenomatous   Hypertension    Obesity    OSA (obstructive sleep apnea)    CPAP   Osteopenia     Past Surgical History:  Procedure Laterality Date   BREAST CYST ASPIRATION     COLONOSCOPY  2014   negative   COLONOSCOPY W/ POLYPECTOMY     adenomatous polyps 2003 & 2007 Due 2012. Dr.Kaplan    G 2 P 2     no prior surgery      Social History   Socioeconomic History   Marital status: Married    Spouse name: Not on file   Number of children: 2   Years of education: Not on file   Highest education level: Associate degree: academic program  Occupational History   Occupation: office assistant    Comment: [employer]  Tobacco Use   Smoking status: Former    Packs/day: 1.00    Years: 12.00    Pack years: 12.00    Types: Cigarettes    Quit date: 10/29/1986    Years since quitting: 34.6  Smokeless tobacco: Never  Vaping Use   Vaping Use: Never used  Substance and Sexual Activity   Alcohol use: Never   Drug use: Never   Sexual activity: Not on file  Other Topics Concern   Not on file  Social History Narrative   Lives with husband   Caffeine none since 2015   Social Determinants of Health   Financial Resource Strain: Not on file  Food Insecurity: Not on file  Transportation Needs: Not on file  Physical Activity: Not on file  Stress: Not on file  Social Connections: Not on file    Family History  Problem Relation Age of Onset   Anemia Mother    Colon polyps Father    Diabetes Maternal Grandfather    Cancer Paternal Grandmother        stomach   Stomach cancer Paternal Grandmother    Stroke Paternal Grandfather 23   Asthma Son    Anemia Other        Cousin    Colon cancer Neg Hx    Esophageal cancer Neg Hx    Rectal cancer Neg Hx      Review of Systems  Constitutional:  Negative for fever.  Gastrointestinal:  Negative for abdominal pain, blood in stool, constipation, diarrhea and nausea.  Genitourinary:  Positive for flank pain (left side), frequency and hematuria. Negative for dysuria and urgency.  Neurological:  Negative for light-headedness and headaches.      Objective:  There were no vitals filed for this visit. BP Readings from Last 3 Encounters:  05/29/21 136/84  02/28/21 (!) 154/90  02/06/21 (!) 144/84   Wt Readings from Last 3 Encounters:  05/29/21 185 lb (83.9 kg)  02/28/21 188 lb 3.2 oz (85.4 kg)  02/06/21 190 lb 9.6 oz (86.5 kg)   There is no height or weight on file to calculate BMI.   Physical Exam    Constitutional:      General: She is not in acute distress.    Appearance: Normal appearance. She is not ill-appearing.  HENT:     Head: Normocephalic and atraumatic.  Abdominal:     General: There is no distension.     Palpations: Abdomen is soft.     Tenderness: There is no abdominal tenderness. There is no right CVA tenderness, left CVA tenderness, guarding or rebound.  Skin:    General: Skin is warm and dry.  Neurological:     Mental Status: She is alert.       Assessment & Plan:    See Problem List for Assessment and Plan of chronic medical problems.    This visit occurred during the SARS-CoV-2 public health emergency.  Safety protocols were in place, including screening questions prior to the visit, additional usage of staff PPE, and extensive cleaning of exam room while observing appropriate contact time as indicated for disinfecting solutions.

## 2021-06-02 NOTE — Assessment & Plan Note (Signed)
Acute Urinary frequency and gross hematuria along with left flank pain Urine dip here shows blood, otherwise normal UTI possible, but stone seems more likely Will send urine for culture KUB stat today CT renal ordered to rule out stone, which unfortunately will probably not be able to get done until Monday Vicodin 5-325 mg 1 every 6 hours as needed for severe pain.  Discussed possible side effects Flomax 0.4 mg daily to help pass stone Increase water intake If symptoms worsen she will go to the emergency room over the weekend

## 2021-06-02 NOTE — Patient Instructions (Addendum)
An Xray was ordered.     Medications changes include :   flomax daily, hydrocodone for pain if needed  Your prescription(s) have been submitted to your pharmacy. Please take as directed and contact our office if you believe you are having problem(s) with the medication(s).   A kidney ct scan was ordered - they will call you to schedule this.     Kidney Stones  Kidney stones are solid, rock-like deposits that form inside of the kidneys. The kidneys are a pair of organs that make urine. A kidney stone may form in a kidney and move into other parts of the urinary tract, including the tubes that connect the kidneys to the bladder (ureters), the bladder, and the tube that carries urine out of the body (urethra). As the stone moves through these areas, it can cause intense pain and blockthe flow of urine. Kidney stones are created when high levels of certain minerals are found in the urine. The stones are usually passed out of the body through urination, but insome cases, medical treatment may be needed to remove them. What are the causes? Kidney stones may be caused by: A condition in which certain glands produce too much parathyroid hormone (primary hyperparathyroidism), which causes too much calcium buildup in the blood. A buildup of uric acid crystals in the bladder (hyperuricosuria). Uric acid is a chemical that the body produces when you eat certain foods. It usually exits the body in the urine. Narrowing (stricture) of one or both of the ureters. A kidney blockage that is present at birth (congenital obstruction). Past surgery on the kidney or the ureters, such as gastric bypass surgery. What increases the risk? The following factors may make you more likely to develop this condition: Having had a kidney stone in the past. Having a family history of kidney stones. Not drinking enough water. Eating a diet that is high in protein, salt (sodium), or sugar. Being overweight or  obese. What are the signs or symptoms? Symptoms of a kidney stone may include: Pain in the side of the abdomen, right below the ribs (flank pain). Pain usually spreads (radiates) to the groin. Needing to urinate frequently or urgently. Painful urination. Blood in the urine (hematuria). Nausea. Vomiting. Fever and chills. How is this diagnosed? This condition may be diagnosed based on: Your symptoms and medical history. A physical exam. Blood tests. Urine tests. These may be done before and after the stone passes out of your body through urination. Imaging tests, such as a CT scan, abdominal X-ray, or ultrasound. A procedure to examine the inside of the bladder (cystoscopy). How is this treated? Treatment for kidney stones depends on the size, location, and makeup of the stones. Kidney stones will often pass out of the body through urination. You may need to: Increase your fluid intake to help pass the stone. In some cases, you may be given fluids through an IV and may need to be monitored at the hospital. Take medicine for pain. Make changes in your diet to help prevent kidney stones from coming back. Sometimes, medical procedures are needed to remove a kidney stone. This may involve: A procedure to break up kidney stones using: A focused beam of light (laser therapy). Shock waves (extracorporeal shock wave lithotripsy). Surgery to remove kidney stones. This may be needed if you have severe pain or have stones that block your urinary tract. Follow these instructions at home: Medicines Take over-the-counter and prescription medicines only as told by your health care  provider. Ask your health care provider if the medicine prescribed to you requires you to avoid driving or using heavy machinery. Eating and drinking Drink enough fluid to keep your urine pale yellow. You may be instructed to drink at least 8-10 glasses of water each day. This will help you pass the kidney stone. If  directed, change your diet. This may include: Limiting how much sodium you eat. Eating more fruits and vegetables. Limiting how much animal protein--such as red meat, poultry, fish, and eggs--you eat. Follow instructions from your health care provider about eating or drinking restrictions. General instructions Collect urine samples as told by your health care provider. You may need to collect a urine sample: 24 hours after you pass the stone. 8-12 weeks after passing the kidney stone, and every 6-12 months after that. Strain your urine every time you urinate, for as long as directed. Use the strainer that your health care provider recommends. Do not throw out the kidney stone after passing it. Keep the stone so it can be tested by your health care provider. Testing the makeup of your kidney stone may help prevent you from getting kidney stones in the future. Keep all follow-up visits as told by your health care provider. This is important. You may need follow-up X-rays or ultrasounds to make sure that your stone has passed. How is this prevented? To prevent another kidney stone: Drink enough fluid to keep your urine pale yellow. This is the best way to prevent kidney stones. Eat a healthy diet and follow recommendations from your health care provider about foods to avoid. You may be instructed to eat a low-protein diet. Recommendations vary depending on the type of kidney stone that you have. Maintain a healthy weight. Where to find more information [facility]): www.kidney.Colon [facility]): www.urologyhealth.org Contact a health care provider if: You have pain that gets worse or does not get better with medicine. Get help right away if: You have a fever or chills. You develop severe pain. You develop new abdominal pain. You faint. You are unable to urinate. Summary Kidney stones are solid, rock-like deposits that form inside of the kidneys. Kidney  stones can cause nausea, vomiting, blood in the urine, abdominal pain, and the urge to urinate frequently. Treatment for kidney stones depends on the size, location, and makeup of the stones. Kidney stones will often pass out of the body through urination. Kidney stones can be prevented by drinking enough fluids, eating a healthy diet, and maintaining a healthy weight. This information is not intended to replace advice given to you by your health care provider. Make sure you discuss any questions you have with your healthcare provider. Document Revised: 02/27/2019 Document Reviewed: 03/03/2019 Elsevier Patient Education  [place].

## 2021-06-05 LAB — URINE CULTURE

## 2021-06-05 MED ORDER — NITROFURANTOIN MONOHYD MACRO 100 MG PO CAPS: 100.0000 mg | ORAL_CAPSULE | Freq: Two times a day (BID) | ORAL | 0 refills | Status: DC

## 2021-06-05 NOTE — Addendum Note (Signed)
Addended by: Binnie Rail on: 06/05/2021 02:11 PM   Modules accepted: Orders

## 2021-06-07 ENCOUNTER — Ambulatory Visit: Payer: Medicare HMO | Admitting: Internal Medicine

## 2021-06-08 ENCOUNTER — Other Ambulatory Visit: Payer: Self-pay

## 2021-06-08 ENCOUNTER — Ambulatory Visit (INDEPENDENT_AMBULATORY_CARE_PROVIDER_SITE_OTHER)
Admission: RE | Admit: 2021-06-08 | Discharge: 2021-06-08 | Disposition: A | Discharge status: Home / Self Care | Payer: Medicare HMO | Source: Ambulatory Visit | Attending: Internal Medicine | Admitting: Internal Medicine

## 2021-06-08 DIAGNOSIS — K8020 Calculus of gallbladder without cholecystitis without obstruction: Secondary | ICD-10-CM | POA: Diagnosis not present

## 2021-06-08 DIAGNOSIS — R310 Gross hematuria: Secondary | ICD-10-CM | POA: Diagnosis not present

## 2021-06-08 DIAGNOSIS — M47816 Spondylosis without myelopathy or radiculopathy, lumbar region: Secondary | ICD-10-CM | POA: Diagnosis not present

## 2021-06-08 DIAGNOSIS — R109 Unspecified abdominal pain: Secondary | ICD-10-CM

## 2021-06-08 DIAGNOSIS — I700 Atherosclerosis of aorta: Secondary | ICD-10-CM | POA: Diagnosis not present

## 2021-06-08 NOTE — Addendum Note (Signed)
Addended by: Binnie Rail on: 06/08/2021 07:55 PM   Modules accepted: Orders

## 2021-06-14 ENCOUNTER — Ambulatory Visit
Admission: RE | Admit: 2021-06-14 | Discharge: 2021-06-14 | Disposition: A | Discharge status: Home / Self Care | Payer: Medicare HMO | Source: Ambulatory Visit | Attending: Internal Medicine | Admitting: Internal Medicine

## 2021-06-14 DIAGNOSIS — R310 Gross hematuria: Secondary | ICD-10-CM

## 2021-06-14 DIAGNOSIS — N281 Cyst of kidney, acquired: Secondary | ICD-10-CM

## 2021-06-15 ENCOUNTER — Telehealth: Payer: Self-pay

## 2021-06-17 DIAGNOSIS — N281 Cyst of kidney, acquired: Secondary | ICD-10-CM | POA: Insufficient documentation

## 2021-06-17 DIAGNOSIS — N3289 Other specified disorders of bladder: Secondary | ICD-10-CM | POA: Insufficient documentation

## 2021-06-17 NOTE — Telephone Encounter (Signed)
Message sent to pt.  Referral ordered for urology

## 2021-06-17 NOTE — Addendum Note (Signed)
Addended by: Binnie Rail on: 06/17/2021 11:36 AM   Modules accepted: Orders

## 2021-06-23 DIAGNOSIS — C672 Malignant neoplasm of lateral wall of bladder: Secondary | ICD-10-CM | POA: Diagnosis not present

## 2021-06-24 ENCOUNTER — Other Ambulatory Visit: Payer: Self-pay | Admitting: Internal Medicine

## 2021-06-26 ENCOUNTER — Ambulatory Visit (INDEPENDENT_AMBULATORY_CARE_PROVIDER_SITE_OTHER)
Admission: RE | Admit: 2021-06-26 | Discharge: 2021-06-26 | Disposition: A | Discharge status: Home / Self Care | Payer: Medicare HMO | Source: Ambulatory Visit | Attending: Internal Medicine | Admitting: Internal Medicine

## 2021-06-26 ENCOUNTER — Other Ambulatory Visit: Payer: Self-pay

## 2021-06-26 DIAGNOSIS — R911 Solitary pulmonary nodule: Secondary | ICD-10-CM | POA: Diagnosis not present

## 2021-06-26 DIAGNOSIS — J9811 Atelectasis: Secondary | ICD-10-CM | POA: Diagnosis not present

## 2021-06-26 DIAGNOSIS — I700 Atherosclerosis of aorta: Secondary | ICD-10-CM | POA: Diagnosis not present

## 2021-06-26 DIAGNOSIS — R918 Other nonspecific abnormal finding of lung field: Secondary | ICD-10-CM | POA: Diagnosis not present

## 2021-06-26 DIAGNOSIS — IMO0001 Reserved for inherently not codable concepts without codable children: Secondary | ICD-10-CM

## 2021-06-28 ENCOUNTER — Encounter: Payer: Self-pay | Admitting: Internal Medicine

## 2021-06-28 DIAGNOSIS — I700 Atherosclerosis of aorta: Secondary | ICD-10-CM | POA: Insufficient documentation

## 2021-06-28 NOTE — Addendum Note (Signed)
Addended by: Binnie Rail on: 06/28/2021 05:09 PM   Modules accepted: Orders

## 2021-06-29 ENCOUNTER — Other Ambulatory Visit: Payer: Self-pay | Admitting: Urology

## 2021-06-30 ENCOUNTER — Encounter (HOSPITAL_BASED_OUTPATIENT_CLINIC_OR_DEPARTMENT_OTHER): Payer: Self-pay | Admitting: Urology

## 2021-06-30 ENCOUNTER — Other Ambulatory Visit: Payer: Self-pay

## 2021-06-30 NOTE — Progress Notes (Signed)
Spoke w/ via phone for pre-op interview--- pt Lab needs dos----  [facility] and ekg             Lab results------ no COVID test -----patient states asymptomatic no test needed Arrive at -------  0530 on  07-06-2021 NPO after MN NO Solid Food.  Clear liquids from MN until--- 0430 Med rec completed Medications to take morning of surgery ----- none Diabetic medication ----- n./a Patient instructed no nail polish to be worn day of surgery Patient instructed to bring photo id and insurance card day of surgery Patient aware to have Driver (ride ) / caregiver for 24 hours after surgery --husband, Angel Williamson Patient Special Instructions ----- n/a Pre-Op special Istructions ----- n/a Patient verbalized understanding of instructions that were given at this phone interview. Patient denies shortness of breath, chest pain, fever, cough at this phone interview.

## 2021-07-01 ENCOUNTER — Encounter: Payer: Self-pay | Admitting: Internal Medicine

## 2021-07-01 DIAGNOSIS — C679 Malignant neoplasm of bladder, unspecified: Secondary | ICD-10-CM | POA: Insufficient documentation

## 2021-07-04 ENCOUNTER — Other Ambulatory Visit: Payer: Self-pay | Admitting: Urology

## 2021-07-06 ENCOUNTER — Encounter (HOSPITAL_BASED_OUTPATIENT_CLINIC_OR_DEPARTMENT_OTHER)
Admission: RE | Disposition: A | Discharge status: Home / Self Care | Payer: Self-pay | Source: Home / Self Care | Attending: Urology

## 2021-07-06 ENCOUNTER — Ambulatory Visit (HOSPITAL_BASED_OUTPATIENT_CLINIC_OR_DEPARTMENT_OTHER)
Admission: RE | Admit: 2021-07-06 | Discharge: 2021-07-06 | Disposition: A | Discharge status: Home / Self Care | Payer: Medicare HMO | Attending: Urology | Admitting: Urology

## 2021-07-06 ENCOUNTER — Ambulatory Visit (HOSPITAL_BASED_OUTPATIENT_CLINIC_OR_DEPARTMENT_OTHER): Payer: Medicare HMO | Admitting: Certified Registered Nurse Anesthetist

## 2021-07-06 ENCOUNTER — Encounter (HOSPITAL_BASED_OUTPATIENT_CLINIC_OR_DEPARTMENT_OTHER): Payer: Self-pay | Admitting: Urology

## 2021-07-06 DIAGNOSIS — G4730 Sleep apnea, unspecified: Secondary | ICD-10-CM | POA: Insufficient documentation

## 2021-07-06 DIAGNOSIS — Z9989 Dependence on other enabling machines and devices: Secondary | ICD-10-CM | POA: Diagnosis not present

## 2021-07-06 DIAGNOSIS — C672 Malignant neoplasm of lateral wall of bladder: Secondary | ICD-10-CM

## 2021-07-06 DIAGNOSIS — G4733 Obstructive sleep apnea (adult) (pediatric): Secondary | ICD-10-CM | POA: Diagnosis not present

## 2021-07-06 DIAGNOSIS — E559 Vitamin D deficiency, unspecified: Secondary | ICD-10-CM | POA: Diagnosis not present

## 2021-07-06 DIAGNOSIS — I10 Essential (primary) hypertension: Secondary | ICD-10-CM | POA: Diagnosis not present

## 2021-07-06 DIAGNOSIS — Z87891 Personal history of nicotine dependence: Secondary | ICD-10-CM | POA: Diagnosis not present

## 2021-07-06 HISTORY — DX: Obstructive sleep apnea (adult) (pediatric): G47.33

## 2021-07-06 HISTORY — DX: Solitary pulmonary nodule: R91.1

## 2021-07-06 HISTORY — PX: TRANSURETHRAL RESECTION OF BLADDER TUMOR WITH MITOMYCIN-C: SHX6459

## 2021-07-06 HISTORY — DX: Malignant neoplasm of bladder, unspecified: C67.9

## 2021-07-06 HISTORY — DX: Asymptomatic varicose veins of bilateral lower extremities: I83.93

## 2021-07-06 HISTORY — DX: Obstructive sleep apnea (adult) (pediatric): Z99.89

## 2021-07-06 HISTORY — DX: Personal history of colonic polyps: Z86.010

## 2021-07-06 HISTORY — DX: Personal history of adenomatous and serrated colon polyps: Z86.0101

## 2021-07-06 HISTORY — DX: Cyst of kidney, acquired: N28.1

## 2021-07-06 LAB — POCT I-STAT, CHEM 8
BUN: 19 mg/dL (ref 8–23)
Calcium, Ion: 1.26 mmol/L (ref 1.15–1.40)
Chloride: 104 mmol/L (ref 98–111)
Creatinine, Ser: 0.8 mg/dL (ref 0.44–1.00)
Glucose, Bld: 105 mg/dL — ABNORMAL HIGH (ref 70–99)
HCT: 37 % (ref 36.0–46.0)
Hemoglobin: 12.6 g/dL (ref 12.0–15.0)
Potassium: 4.1 mmol/L (ref 3.5–5.1)
Sodium: 142 mmol/L (ref 135–145)
TCO2: 29 mmol/L (ref 22–32)

## 2021-07-06 SURGERY — TRANSURETHRAL RESECTION OF BLADDER TUMOR WITH MITOMYCIN-C
Anesthesia: General | Site: Bladder | Laterality: Bilateral

## 2021-07-06 MED ORDER — STERILE WATER FOR IRRIGATION IR SOLN
Status: DC | PRN
  Administered 2021-07-06: 500 mL

## 2021-07-06 MED ORDER — SODIUM CHLORIDE 0.9 % IR SOLN
Status: DC | PRN
  Administered 2021-07-06: 3000 mL via INTRAVESICAL

## 2021-07-06 MED ORDER — ONDANSETRON HCL 4 MG/2ML IJ SOLN
INTRAMUSCULAR | Status: AC
  Filled 2021-07-06: qty 2

## 2021-07-06 MED ORDER — MIDAZOLAM HCL 2 MG/2ML IJ SOLN
INTRAMUSCULAR | Status: AC
  Filled 2021-07-06: qty 2

## 2021-07-06 MED ORDER — GEMCITABINE CHEMO FOR BLADDER INSTILLATION 2000 MG
2000.0000 mg | Freq: Once | INTRAVENOUS | Status: AC
  Administered 2021-07-06: 2000 mg via INTRAVESICAL
  Filled 2021-07-06: qty 2000

## 2021-07-06 MED ORDER — ONDANSETRON HCL 4 MG/2ML IJ SOLN: 4.0000 mg | Freq: Once | INTRAMUSCULAR | Status: DC | PRN

## 2021-07-06 MED ORDER — PROPOFOL 10 MG/ML IV BOLUS
INTRAVENOUS | Status: AC
  Filled 2021-07-06: qty 20

## 2021-07-06 MED ORDER — PROPOFOL 10 MG/ML IV BOLUS
INTRAVENOUS | Status: DC | PRN
  Administered 2021-07-06: 140 mg via INTRAVENOUS

## 2021-07-06 MED ORDER — IOHEXOL 300 MG/ML  SOLN
INTRAMUSCULAR | Status: DC | PRN
  Administered 2021-07-06: 10 mL

## 2021-07-06 MED ORDER — FENTANYL CITRATE (PF) 100 MCG/2ML IJ SOLN
INTRAMUSCULAR | Status: DC | PRN
  Administered 2021-07-06: 100 ug via INTRAVENOUS

## 2021-07-06 MED ORDER — MIDAZOLAM HCL 5 MG/5ML IJ SOLN
INTRAMUSCULAR | Status: DC | PRN
  Administered 2021-07-06: 2 mg via INTRAVENOUS

## 2021-07-06 MED ORDER — ONDANSETRON HCL 4 MG/2ML IJ SOLN
INTRAMUSCULAR | Status: DC | PRN
  Administered 2021-07-06: 4 mg via INTRAVENOUS

## 2021-07-06 MED ORDER — BELLADONNA ALKALOIDS-OPIUM 16.2-60 MG RE SUPP
RECTAL | Status: DC | PRN
  Administered 2021-07-06: 1 via RECTAL

## 2021-07-06 MED ORDER — EPHEDRINE 5 MG/ML INJ
INTRAVENOUS | Status: AC
  Filled 2021-07-06: qty 5

## 2021-07-06 MED ORDER — LIDOCAINE 2% (20 MG/ML) 5 ML SYRINGE
INTRAMUSCULAR | Status: AC
  Filled 2021-07-06: qty 5

## 2021-07-06 MED ORDER — PHENAZOPYRIDINE HCL 200 MG PO TABS: 200.0000 mg | ORAL_TABLET | Freq: Three times a day (TID) | ORAL | 0 refills | Status: DC | PRN

## 2021-07-06 MED ORDER — MEPERIDINE HCL 25 MG/ML IJ SOLN: 6.2500 mg | INTRAMUSCULAR | Status: DC | PRN

## 2021-07-06 MED ORDER — FENTANYL CITRATE (PF) 100 MCG/2ML IJ SOLN
INTRAMUSCULAR | Status: AC
  Filled 2021-07-06: qty 2

## 2021-07-06 MED ORDER — TRAMADOL HCL 50 MG PO TABS: 50.0000 mg | ORAL_TABLET | Freq: Four times a day (QID) | ORAL | 0 refills | Status: DC | PRN

## 2021-07-06 MED ORDER — SUCCINYLCHOLINE CHLORIDE 200 MG/10ML IV SOSY
PREFILLED_SYRINGE | INTRAVENOUS | Status: AC
  Filled 2021-07-06: qty 10

## 2021-07-06 MED ORDER — SUCCINYLCHOLINE CHLORIDE 200 MG/10ML IV SOSY
PREFILLED_SYRINGE | INTRAVENOUS | Status: DC | PRN
  Administered 2021-07-06: 40 mg via INTRAVENOUS

## 2021-07-06 MED ORDER — ACETAMINOPHEN 160 MG/5ML PO SOLN: 325.0000 mg | ORAL | Status: DC | PRN

## 2021-07-06 MED ORDER — FENTANYL CITRATE (PF) 100 MCG/2ML IJ SOLN
25.0000 ug | INTRAMUSCULAR | Status: DC | PRN
  Administered 2021-07-06: 50 ug via INTRAVENOUS

## 2021-07-06 MED ORDER — CEFAZOLIN SODIUM-DEXTROSE 2-4 GM/100ML-% IV SOLN
2.0000 g | Freq: Once | INTRAVENOUS | Status: AC
  Administered 2021-07-06: 2 g via INTRAVENOUS

## 2021-07-06 MED ORDER — LIDOCAINE 2% (20 MG/ML) 5 ML SYRINGE
INTRAMUSCULAR | Status: DC | PRN
  Administered 2021-07-06: 100 mg via INTRAVENOUS

## 2021-07-06 MED ORDER — DEXAMETHASONE SODIUM PHOSPHATE 10 MG/ML IJ SOLN
INTRAMUSCULAR | Status: AC
  Filled 2021-07-06: qty 1

## 2021-07-06 MED ORDER — CEFAZOLIN SODIUM-DEXTROSE 2-4 GM/100ML-% IV SOLN
INTRAVENOUS | Status: AC
  Filled 2021-07-06: qty 100

## 2021-07-06 MED ORDER — OXYCODONE HCL 5 MG PO TABS: 5.0000 mg | ORAL_TABLET | Freq: Once | ORAL | Status: DC | PRN

## 2021-07-06 MED ORDER — BELLADONNA ALKALOIDS-OPIUM 16.2-60 MG RE SUPP
RECTAL | Status: AC
  Filled 2021-07-06: qty 1

## 2021-07-06 MED ORDER — OXYCODONE HCL 5 MG/5ML PO SOLN: 5.0000 mg | Freq: Once | ORAL | Status: DC | PRN

## 2021-07-06 MED ORDER — ACETAMINOPHEN 325 MG PO TABS: 325.0000 mg | ORAL_TABLET | ORAL | Status: DC | PRN

## 2021-07-06 MED ORDER — DEXAMETHASONE SODIUM PHOSPHATE 10 MG/ML IJ SOLN
INTRAMUSCULAR | Status: DC | PRN
  Administered 2021-07-06: 10 mg via INTRAVENOUS

## 2021-07-06 MED ORDER — LACTATED RINGERS IV SOLN: INTRAVENOUS | Status: DC

## 2021-07-06 MED ORDER — EPHEDRINE SULFATE-NACL 50-0.9 MG/10ML-% IV SOSY
PREFILLED_SYRINGE | INTRAVENOUS | Status: DC | PRN
  Administered 2021-07-06: 10 mg via INTRAVENOUS

## 2021-07-06 MED ORDER — 0.9 % SODIUM CHLORIDE (POUR BTL) OPTIME
TOPICAL | Status: DC | PRN
  Administered 2021-07-06: 500 mL

## 2021-07-06 SURGICAL SUPPLY — 22 items
BAG DRAIN URO-CYSTO SKYTR STRL (DRAIN) ×2 IMPLANT
BAG DRN RND TRDRP ANRFLXCHMBR (UROLOGICAL SUPPLIES) ×1
BAG DRN UROCATH (DRAIN) ×1
BAG URINE DRAIN 2000ML AR STRL (UROLOGICAL SUPPLIES) ×2 IMPLANT
CATH FOLEY 2WAY SLVR  5CC 18FR (CATHETERS) ×2
CATH FOLEY 2WAY SLVR 5CC 18FR (CATHETERS) IMPLANT
CATH FOLEY 3WAY 30CC 22FR (CATHETERS) ×1 IMPLANT
CLOTH BEACON ORANGE TIMEOUT ST (SAFETY) ×2 IMPLANT
ELECT REM PT RETURN 9FT ADLT (ELECTROSURGICAL)
ELECTRODE REM PT RTRN 9FT ADLT (ELECTROSURGICAL) IMPLANT
EVACUATOR MICROVAS BLADDER (UROLOGICAL SUPPLIES) IMPLANT
GLOVE SURG ENC MOIS LTX SZ7.5 (GLOVE) ×2 IMPLANT
GOWN STRL REUS W/TWL LRG LVL3 (GOWN DISPOSABLE) ×4 IMPLANT
HOLDER FOLEY CATH W/STRAP (MISCELLANEOUS) IMPLANT
IV NS IRRIG 3000ML ARTHROMATIC (IV SOLUTION) ×6 IMPLANT
KIT TURNOVER CYSTO (KITS) ×2 IMPLANT
MANIFOLD NEPTUNE II (INSTRUMENTS) ×2 IMPLANT
NS IRRIG 500ML POUR BTL (IV SOLUTION) ×1 IMPLANT
PACK CYSTO (CUSTOM PROCEDURE TRAY) ×2 IMPLANT
SYR 30ML LL (SYRINGE) ×1 IMPLANT
TUBE CONNECTING 12X1/4 (SUCTIONS) ×1 IMPLANT
WATER STERILE IRR 500ML POUR (IV SOLUTION) ×2 IMPLANT

## 2021-07-06 NOTE — Anesthesia Procedure Notes (Signed)
Procedure Name: LMA Insertion Date/Time: 07/06/2021 7:33 AM Performed by: Rogers Blocker, CRNA Pre-anesthesia Checklist: Patient identified, Emergency Drugs available, Suction available and Patient being monitored Patient Re-evaluated:Patient Re-evaluated prior to induction Oxygen Delivery Method: Circle System Utilized Preoxygenation: Pre-oxygenation with 100% oxygen Induction Type: IV induction Ventilation: Mask ventilation without difficulty LMA: LMA inserted LMA Size: 4.0 Number of attempts: 1 Placement Confirmation: positive ETCO2 Tube secured with: Tape Dental Injury: Teeth and Oropharynx as per pre-operative assessment

## 2021-07-06 NOTE — Anesthesia Preprocedure Evaluation (Signed)
Anesthesia Evaluation  Patient identified by MRN, date of birth, ID band Patient awake    Reviewed: Allergy & Precautions, NPO status , Patient's Chart, lab work & pertinent test results  Airway Mallampati: I       Dental no notable dental hx.    Pulmonary sleep apnea and Continuous Positive Airway Pressure Ventilation , former smoker,    Pulmonary exam normal        Cardiovascular hypertension, Pt. on medications Normal cardiovascular exam     Neuro/Psych PSYCHIATRIC DISORDERS Anxiety negative neurological ROS     GI/Hepatic negative GI ROS, Neg liver ROS,   Endo/Other  negative endocrine ROS  Renal/GU      Musculoskeletal negative musculoskeletal ROS (+)   Abdominal (+) + obese,   Peds  Hematology negative hematology ROS (+)   Anesthesia Other Findings   Reproductive/Obstetrics                             Anesthesia Physical Anesthesia Plan  ASA: 2  Anesthesia Plan: General   Post-op Pain Management:    Induction:   PONV Risk Score and Plan: 4 or greater and Ondansetron, Dexamethasone, Midazolam and Treatment may vary due to age or medical condition  Airway Management Planned: LMA  Additional Equipment: None  Intra-op Plan:   Post-operative Plan: Extubation in OR  Informed Consent: I have reviewed the patients History and Physical, chart, labs and discussed the procedure including the risks, benefits and alternatives for the proposed anesthesia with the patient or authorized representative who has indicated his/her understanding and acceptance.     Dental advisory given  Plan Discussed with:   Anesthesia Plan Comments:         Anesthesia Quick Evaluation

## 2021-07-06 NOTE — Interval H&P Note (Signed)
History and Physical Interval Note:  07/06/2021 7:16 AM  Angel Williamson  has presented today for surgery, with the diagnosis of BLADDER CANCER.  The various methods of treatment have been discussed with the patient and family. After consideration of risks, benefits and other options for treatment, the patient has consented to  Procedure(s): TRANSURETHRAL RESECTION OF BLADDER TUMOR BILATERAL RETROGRADE PYELOGRAM WITH GEMCITABINE (Bilateral) as a surgical intervention.  The patient's history has been reviewed, patient examined, no change in status, stable for surgery.  I have reviewed the patient's chart and labs.  Questions were answered to the patient's satisfaction.     Ardis Hughs

## 2021-07-06 NOTE — Anesthesia Postprocedure Evaluation (Signed)
Anesthesia Post Note  Patient: Angel Williamson  Procedure(s) Performed: TRANSURETHRAL RESECTION OF BLADDER TUMOR BILATERAL RETROGRADE PYELOGRAM WITH GEMCITABINE INSTILLATION IN PACU (Bilateral: Bladder)     Patient location during evaluation: PACU Anesthesia Type: General Level of consciousness: awake and sedated Pain management: pain level controlled Vital Signs Assessment: post-procedure vital signs reviewed and stable Respiratory status: spontaneous breathing Cardiovascular status: stable Postop Assessment: no apparent nausea or vomiting Anesthetic complications: no   No notable events documented.  Last Vitals:  Vitals:   07/06/21 0607 07/06/21 0818  BP: (!) 162/90   Pulse: 75   Resp: (!) 24   Temp: 37.2 C 36.7 C  SpO2: 100%     Last Pain:  Vitals:   07/06/21 0818  TempSrc: Oral  PainSc:                  Huston Foley

## 2021-07-06 NOTE — Transfer of Care (Signed)
Immediate Anesthesia Transfer of Care Note  Patient: Angel Williamson  Procedure(s) Performed: TRANSURETHRAL RESECTION OF BLADDER TUMOR BILATERAL RETROGRADE PYELOGRAM WITH GEMCITABINE INSTILLATION IN PACU (Bilateral: Bladder)  Patient Location: PACU  Anesthesia Type:General  Level of Consciousness: awake, alert , oriented and patient cooperative  Airway & Oxygen Therapy: Patient Spontanous Breathing  Post-op Assessment: Report given to RN and Post -op Vital signs reviewed and stable  Post vital signs: Reviewed and stable  Last Vitals:  Vitals Value Taken Time  BP    Temp    Pulse    Resp    SpO2      Last Pain:  Vitals:   07/06/21 0607  TempSrc: Oral  PainSc: 0-No pain      Patients Stated Pain Goal: 5 ([id number])  Complications: No notable events documented.

## 2021-07-06 NOTE — Discharge Instructions (Addendum)
Transurethral Resection of Bladder Tumor (TURBT) or Bladder Biopsy ° ° °Definition: ° Transurethral Resection of the Bladder Tumor is a surgical procedure used to diagnose and remove tumors within the bladder. TURBT is the most common treatment for early stage bladder cancer. ° °General instructions: °   ° Your recent bladder surgery requires very little post hospital care but some definite precautions. ° °Despite the fact that no skin incisions were used, the area around the bladder incisions are raw and covered with scabs to promote healing and prevent bleeding. Certain precautions are needed to insure that the scabs are not disturbed over the next 2-4 weeks while the healing proceeds. ° °Because the raw surface inside your bladder and the irritating effects of urine you may expect frequency of urination and/or urgency (a stronger desire to urinate) and perhaps even getting up at night more often. This will usually resolve or improve slowly over the healing period. You may see some blood in your urine over the first 6 weeks. Do not be alarmed, even if the urine was clear for a while. Get off your feet and drink lots of fluids until clearing occurs. If you start to pass clots or don't improve call us. ° °Diet: ° °You may return to your normal diet immediately. Because of the raw surface of your bladder, alcohol, spicy foods, foods high in acid and drinks with caffeine may cause irritation or frequency and should be used in moderation. To keep your urine flowing freely and avoid constipation, drink plenty of fluids during the day (8-10 glasses). Tip: Avoid cranberry juice because it is very acidic. ° °Activity: ° °Your physical activity doesn't need to be restricted. However, if you are very active, you may see some blood in the urine. We suggest that you reduce your activity under the circumstances until the bleeding has stopped. ° °Bowels: ° °It is important to keep your bowels regular during the postoperative  period. Straining with bowel movements can cause bleeding. A bowel movement every other day is reasonable. Use a mild laxative if needed, such as milk of magnesia 2-3 tablespoons, or 2 Dulcolax tablets. Call if you continue to have problems. If you had been taking narcotics for pain, before, during or after your surgery, you may be constipated. Take a laxative if necessary. ° ° ° °Medication: ° °You should resume your pre-surgery medications unless told not to. In addition you may be given an antibiotic to prevent or treat infection. Antibiotics are not always necessary. All medication should be taken as prescribed until the bottles are finished unless you are having an unusual reaction to one of the drugs. ° ° ° ° ° °Post Anesthesia Home Care Instructions ° °Activity: °Get plenty of rest for the remainder of the day. A responsible adult should stay with you for 24 hours following the procedure.  °For the next 24 hours, DO NOT: °-Drive a car °-Operate machinery °-Drink alcoholic beverages °-Take any medication unless instructed by your physician °-Make any legal decisions or sign important papers. ° °Meals: °Start with liquid foods such as gelatin or soup. Progress to regular foods as tolerated. Avoid greasy, spicy, heavy foods. If nausea and/or vomiting occur, drink only clear liquids until the nausea and/or vomiting subsides. Call your physician if vomiting continues. ° °Special Instructions/Symptoms: °Your throat may feel dry or sore from the anesthesia or the breathing tube placed in your throat during surgery. If this causes discomfort, gargle with warm salt water. The discomfort should disappear within 24   hours.     

## 2021-07-06 NOTE — H&P (Signed)
65 year old female presents today for evaluation of bladder mass. The patient with the ER primary care doctor for gross hematuria. The CT scan do treated no significant upper urinary tract abnormality, there was some concern for a bladder mass on the right side of bladder. Patient not having any pain. She has not any ongoing gross hematuria.   The patient is a nonsmoker, her last cigarette was 35 years ago. She has no chemical exposures of concern. She has no family history of GU malignancies.   The patient underwent ultrasound which demonstrates a 1.5 cm simple cyst in the left kidney. Also on this ultrasound the bladder mass was also again noted.     ALLERGIES: None   MEDICATIONS: Amlodipine Besylate 2.5 mg tablet  Calcium 1000 Mg  Lorazepam 0.5 mg tablet     GU PSH: None   NON-GU PSH: None   GU PMH: None   NON-GU PMH: Anxiety Hypertension Sleep Apnea    FAMILY HISTORY: 1 Daughter - Daughter 1 son - Son Diabetes - Grandfather nephrolithiasis - Brother   SOCIAL HISTORY: Marital Status: Married Preferred Language: English; Ethnicity: Not Hispanic Or Latino; Race: White Current Smoking Status: Patient does not smoke anymore. Has not smoked since 05/30/1987. Smoked for 15 years. Smoked 1 pack per day.   Tobacco Use Assessment Completed: Used Tobacco in last 30 days? Has never drank.  Does not drink caffeine. Patient's occupation is/was Retired.    REVIEW OF SYSTEMS:    GU Review Female:   Patient reports frequent urination. Patient denies stream starts and stops, burning /pain with urination, trouble starting your stream, have to strain to urinate, hard to postpone urination, leakage of urine, get up at night to urinate, and being pregnant.  Gastrointestinal (Upper):   Patient denies nausea, vomiting, and indigestion/ heartburn.  Gastrointestinal (Lower):   Patient denies diarrhea and constipation.  Constitutional:   Patient denies fever, night sweats, weight loss, and  fatigue.  Skin:   Patient denies skin rash/ lesion and itching.  Eyes:   Patient denies blurred vision and double vision.  Ears/ Nose/ Throat:   Patient denies sore throat and sinus problems.  Hematologic/Lymphatic:   Patient denies swollen glands and easy bruising.  Cardiovascular:   Patient denies leg swelling and chest pains.  Respiratory:   Patient denies cough and shortness of breath.  Endocrine:   Patient denies excessive thirst.  Musculoskeletal:   Patient reports back pain. Patient denies joint pain.  Neurological:   Patient denies headaches and dizziness.  Psychologic:   Patient reports anxiety. Patient denies depression.   Notes: Blood in urine (microscopically), Hx UTI Seen visible blood in urine once    VITAL SIGNS:      06/23/2021 02:49 PM  Weight 180 lb / 81.65 kg  BP 160/99 mmHg  Pulse 97 /min  Temperature 98.0 F / 36.6 C   GU PHYSICAL EXAMINATION:    External Genitalia: No hirsutism, no rash, no scarring, no cyst, no erythematous lesion, no papular lesion, no blanched lesion, no warty lesion. No edema.  Urethral Meatus: Normal size. Normal position. No discharge.  Urethra: No tenderness, no mass, no scarring. No hypermobility. No leakage.  Bladder: Normal to palpation, no tenderness, no mass, normal size.  Vagina: No atrophy, no stenosis. No rectocele. No cystocele. No enterocele.  Anus and Perineum: No hemorrhoids. No anal stenosis. No rectal fissure, no anal fissure. No edema, no dimple, no perineal tenderness, no anal tenderness.   MULTI-SYSTEM PHYSICAL EXAMINATION:    Constitutional:  Well-nourished. No physical deformities. Normally developed. Good grooming.  Neck: Neck symmetrical, not swollen. Normal tracheal position.  Respiratory: Normal breath sounds. No labored breathing, no use of accessory muscles.   Cardiovascular: Regular rate and rhythm. No murmur, no gallop. Normal temperature, normal extremity pulses, no swelling, no varicosities.   Lymphatic: No  enlargement of neck, axillae, groin.  Skin: No paleness, no jaundice, no cyanosis. No lesion, no ulcer, no rash.  Neurologic / Psychiatric: Oriented to time, oriented to place, oriented to person. No depression, no anxiety, no agitation.  Gastrointestinal: No mass, no tenderness, no rigidity, non obese abdomen.  Eyes: Normal conjunctivae. Normal eyelids.  Ears, Nose, Mouth, and Throat: Left ear no scars, no lesions, no masses. Right ear no scars, no lesions, no masses. Nose no scars, no lesions, no masses. Normal hearing. Normal lips.  Musculoskeletal: Normal gait and station of head and neck.     Complexity of Data:  Source Of History:  Patient  Records Review:   Previous Doctor Records, Previous Patient Records, POC Tool  Urine Test Review:   Urinalysis  X-Ray Review: C.T. Abdomen/Pelvis: Reviewed Films. Discussed With Patient.     PROCEDURES:         Flexible Cystoscopy - 52000  Risks, benefits, and some of the potential complications of the procedure were discussed at length with the patient including infection, bleeding, voiding discomfort, urinary retention, fever, chills, sepsis, and others. All questions were answered. Informed consent was obtained. Antibiotic prophylaxis was given. Sterile technique and intraurethral analgesia were used.  Meatus:  Normal size. Normal location. Normal condition.  Urethra:  No hypermobility. No leakage.  Ureteral Orifices:  Normal location. Normal size. Normal shape. Effluxed clear urine.  Bladder:  3cm low grade appear tumor on right lateral wall      The lower urinary tract was carefully examined. The procedure was well-tolerated and without complications. Antibiotic instructions were given. Instructions were given to call the office immediately for bloody urine, difficulty urinating, urinary retention, painful or frequent urination, fever, chills, nausea, vomiting or other illness. The patient stated that she understood these instructions and would  comply with them.         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 I went through the diagnosis with the patient detail and we discussed treatment options. I recommended proceeding to the operating room for TURBT and postop gemcitabine installation. I went through the rationale for this. After answering all the questions and discussing the surgery  and treatment in detail she opted to proceed. Will get this scheduled as soon as we can.

## 2021-07-07 ENCOUNTER — Encounter (HOSPITAL_BASED_OUTPATIENT_CLINIC_OR_DEPARTMENT_OTHER): Payer: Self-pay | Admitting: Urology

## 2021-07-07 LAB — SURGICAL PATHOLOGY

## 2021-07-10 NOTE — Op Note (Signed)
Preoperative diagnosis:  Low-grade bladder cancer, right lateral wall, 2 cm  Postoperative diagnosis:  Same  Procedure: Cystoscopy, bilateral retrograde pyelogram with interpretation Transurethral section of bladder tumor, 2 cm Instillation of postoperative intravesical gemcitabine  Surgeon: Ardis Hughs, MD  Anesthesia: General  Complications: None  Intraoperative findings:  #1: The patient's right retrograde pyelogram demonstrated normal caliber ureter with no filling defects or abnormalities. #2: The patient's left retrograde pyelogram demonstrated no hydroureteronephrosis or filling defects. #3: The patient's tumor was located on the right lateral wall/trigone region around the obturator.  It was papillary tumor on a narrow stalk.  EBL: Minimal  Specimens: None  Indication: Angel Williamson is a 65 y.o. patient with gross hematuria and findings of low-grade bladder cancer on cystoscopic evaluation.  After reviewing the management options for treatment, he elected to proceed with the above surgical procedure(s). We have discussed the potential benefits and risks of the procedure, side effects of the proposed treatment, the likelihood of the patient achieving the goals of the procedure, and any potential problems that might occur during the procedure or recuperation. Informed consent has been obtained.  Description of procedure:  The patient was taken to the operating room and general anesthesia was induced.  The patient was placed in the dorsal lithotomy position, prepped and draped in the usual sterile fashion, and preoperative antibiotics were administered. A preoperative time-out was performed.   A 21 French 30 degree cystoscope was gently passed through the patient's urethra and the bladder under visual guidance.  The above findings were noted.  Retrograde pyelograms were performed using a 5 [place] open-ended catheter and 10 cc of Omnipaque contrast in each ureter.  The  above findings were noted.  The 21 French sheath was then exchanged for the 26 French resectoscope sheath using the visual obturator.  The obturator was then exchanged for the loop element device and the patient's tumor was systematically resected.  The base of the tumor was then copiously fulgurated.  Hemostasis was noted to be excellent.  The tumor was evacuated and an 10 French Foley catheter was placed in the patient's bladder.  A BNO suppository was placed in her rectum.  In the PACU, ~51m's with 2gm of Gemcitabine was instilled into the patient's bladder through an 185French Foley catheter.  This was allowed to dwell for 60-90 minutes prior to emptying the Foley catheter and removing it.   BArdis Hughs M.D.

## 2021-07-11 ENCOUNTER — Encounter: Payer: Self-pay | Admitting: Internal Medicine

## 2021-07-13 ENCOUNTER — Telehealth: Payer: Self-pay | Admitting: Pulmonary Disease

## 2021-07-13 DIAGNOSIS — G4733 Obstructive sleep apnea (adult) (pediatric): Secondary | ICD-10-CM

## 2021-07-13 NOTE — Telephone Encounter (Signed)
Call returned to patient, confirmed DOB. She states when she wakes up she feels bad in the morning. She reports when she wakes up her legs and arms are tingling. She keeps repeating I just feel bad but is unable to explain what she is feeling. She reports she stopped using the machine for a couple days and she has felt much better. She recently had surgery for low grade bladder cancer. She states she wakes up and her limbs are tingling. Denies any other symptoms. She is wondering if she needs a new machine stating she thinks it has been about 5 years.   VS please advise. Thanks :) I tried to see if I could attach the compliance report but I am not tech enough :)

## 2021-07-13 NOTE — Telephone Encounter (Signed)
Cpap compliance report

## 2021-07-14 NOTE — Telephone Encounter (Signed)
Epic blocked access for me to see attached compliance report.    Please send order for new auto CPAP with pressure range 5 to 15 cm H2O.  Please schedule ROV with me 2 months after she gets new auto CPAP machine.

## 2021-07-14 NOTE — Telephone Encounter (Signed)
Called and spoke with pt letting her know that Dr. Halford Chessman said to send order to [place] to have her CPAP machine replaced and stated to her when she received her new machine to call us so we could schedule her an appt to be seen in 2 months after she received the machine. Pt verbalized understanding. Order has been placed. Nothing further needed.

## 2021-07-20 DIAGNOSIS — R8271 Bacteriuria: Secondary | ICD-10-CM | POA: Diagnosis not present

## 2021-07-20 DIAGNOSIS — M549 Dorsalgia, unspecified: Secondary | ICD-10-CM | POA: Diagnosis not present

## 2021-07-20 DIAGNOSIS — C672 Malignant neoplasm of lateral wall of bladder: Secondary | ICD-10-CM | POA: Diagnosis not present

## 2021-07-30 NOTE — Progress Notes (Signed)
Subjective:    Patient ID: Angel Williamson, female    DOB: Jun 05, 1956, 65 y.o.   MRN: [id number]  This visit occurred during the SARS-CoV-2 public health emergency.  Safety protocols were in place, including screening questions prior to the visit, additional usage of staff PPE, and extensive cleaning of exam room while observing appropriate contact time as indicated for disinfecting solutions.    HPI The patient is here for an acute visit.  Her husband is here with her.    Constant back pain - 2 months ago felt pain in left mid back.  She thought she pulled a muscle.  It only hurts when she presses up on something and it starts burning under her breasts b/l-mostly on the left side.  Leaning over hurts.  Movement does not hurt for the most part.  She typically likes to avoid medication and has not tried much, but Tylenol may help some.  She did see her urologist and mention list and they gave her a Toradol shot that did help for a couple of days.  The urologist did not feel this was urological.     Medications and allergies reviewed with patient and updated if appropriate.  Patient Active Problem List   Diagnosis Date Noted   Bladder cancer ([place]) 07/01/2021   Aortic atherosclerosis ([place]) 06/28/2021   Benign cyst of left kidney 06/17/2021   Urinary frequency 06/02/2021   Gross hematuria 06/02/2021   Left flank pain 06/02/2021   Paresthesia 02/06/2021   Contact dermatitis 02/06/2021   Allergic dermatitis 01/30/2021   Vitamin D deficiency 01/10/2021   Varicose veins of both lower extremities 12/06/2020   Mid back pain on left side 06/27/2020   Dizziness 10/05/2019   Constipation 10/10/2018   Right-sided low back pain without sciatica 09/23/2018   Rectal discharge 09/23/2018   Anxiety 07/22/2017   OSA (obstructive sleep apnea), Severe 08/22/2016   Prediabetes 01/11/2016   Obesity 01/11/2016   Skin cancer, basal cell 04/18/2012   COLONIC POLYPS, HX OF 12/13/2009   Osteopenia  03/29/2009   Essential hypertension 11/14/2007    Current Outpatient Medications on File Prior to Visit  Medication Sig Dispense Refill   Calcium Carbonate (CALCIUM 500 PO) Take 1,000 mg by mouth daily.     Cholecalciferol (D3 ADULT PO) Take 2,000 Units by mouth daily.     EPINEPHrine 0.3 mg/0.3 mL IJ SOAJ injection Use as directed for life-threatening allergic reaction. 2 each 3   HYDROcodone-acetaminophen (NORCO/VICODIN) 5-325 MG tablet Take 1 tablet by mouth every 6 (six) hours as needed for moderate pain. 30 tablet 0   LORazepam (ATIVAN) 0.5 MG tablet Take 0.5 mg by mouth every 8 (eight) hours as needed for anxiety.     No current facility-administered medications on file prior to visit.    Past Medical History:  Diagnosis Date   Bladder cancer [facility])    urologist-- dr Louis Meckel   Breast fibrocystic disorder    History of adenomatous polyp of colon    History of COVID-19 03/2021   per pt positive home test ,  mild symptoms that resolved   Hypertension    followed by pcp    (ETT in epic 07-09-2014 normal)   Lung nodule    followed by dr Halford Chessman   OSA on CPAP    followed by dr Halford Chessman--- study in epic 08-07-20107 severe osa   Osteopenia    Renal cyst, left    Varicose veins of both lower extremities  Past Surgical History:  Procedure Laterality Date   COLONOSCOPY  2013   NO PAST SURGERIES     TRANSURETHRAL RESECTION OF BLADDER TUMOR WITH MITOMYCIN-C Bilateral 07/06/2021   Procedure: TRANSURETHRAL RESECTION OF BLADDER TUMOR BILATERAL RETROGRADE PYELOGRAM WITH GEMCITABINE INSTILLATION IN PACU;  Surgeon: Ardis Hughs, MD;  Location[facility];  Service: Urology;  Laterality: Bilateral;    Social History   Socioeconomic History   Marital status: Married    Spouse name: Not on file   Number of children: 2   Years of education: Not on file   Highest education level: Associate degree: academic program  Occupational History   Occupation: office assistant     Comment: [employer]  Tobacco Use   Smoking status: Former    Packs/day: 1.00    Years: 15.00    Pack years: 15.00    Types: Cigarettes    Quit date: 1988    Years since quitting: 34.7   Smokeless tobacco: Never  Vaping Use   Vaping Use: Never used  Substance and Sexual Activity   Alcohol use: Never   Drug use: Never   Sexual activity: Not on file  Other Topics Concern   Not on file  Social History Narrative   Lives with husband   Caffeine none since 2015   Social Determinants of Health   Financial Resource Strain: Not on file  Food Insecurity: Not on file  Transportation Needs: Not on file  Physical Activity: Not on file  Stress: Not on file  Social Connections: Not on file    Family History  Problem Relation Age of Onset   Anemia Mother    Colon polyps Father    Diabetes Maternal Grandfather    Cancer Paternal Grandmother        stomach   Stomach cancer Paternal Grandmother    Stroke Paternal Grandfather 75   Asthma Son    Anemia Other        Cousin    Colon cancer Neg Hx    Esophageal cancer Neg Hx    Rectal cancer Neg Hx     Review of Systems     Objective:   Vitals:   07/31/21 0753  BP: (!) 148/90  Pulse: 70  Temp: 98.3 F (36.8 C)  SpO2: 97%   BP Readings from Last 3 Encounters:  07/31/21 (!) 148/90  07/06/21 (!) 150/76  06/02/21 (!) 150/90   Wt Readings from Last 3 Encounters:  07/31/21 181 lb (82.1 kg)  07/06/21 185 lb 3.2 oz (84 kg)  06/02/21 185 lb (83.9 kg)   Body mass index is 33.11 kg/m.   Physical Exam Constitutional:      General: She is not in acute distress.    Appearance: Normal appearance. She is not ill-appearing.  Musculoskeletal:        General: Tenderness (Mild tenderness with palpation left mid back and rib cage region.  No thoracic spine tenderness) present. No swelling or deformity.  Skin:    General: Skin is warm and dry.     Findings: No erythema or rash.  Neurological:     Mental Status: She is  alert.  Psychiatric:     Comments: Anxious           Assessment & Plan:    See Problem List for Assessment and Plan of chronic medical problems.

## 2021-07-31 ENCOUNTER — Other Ambulatory Visit: Payer: Self-pay

## 2021-07-31 ENCOUNTER — Ambulatory Visit (INDEPENDENT_AMBULATORY_CARE_PROVIDER_SITE_OTHER): Payer: Medicare HMO

## 2021-07-31 ENCOUNTER — Ambulatory Visit (INDEPENDENT_AMBULATORY_CARE_PROVIDER_SITE_OTHER): Payer: Medicare HMO | Admitting: Internal Medicine

## 2021-07-31 ENCOUNTER — Encounter: Payer: Self-pay | Admitting: Internal Medicine

## 2021-07-31 VITALS — BP 148/90 | HR 70 | Temp 98.3°F | Ht 62.0 in | Wt 181.0 lb

## 2021-07-31 DIAGNOSIS — M549 Dorsalgia, unspecified: Secondary | ICD-10-CM

## 2021-07-31 DIAGNOSIS — R0781 Pleurodynia: Secondary | ICD-10-CM | POA: Diagnosis not present

## 2021-07-31 MED ORDER — LOSARTAN POTASSIUM 25 MG PO TABS: 25.0000 mg | ORAL_TABLET | Freq: Every day | ORAL | 5 refills | Status: DC

## 2021-07-31 MED ORDER — METHOCARBAMOL 500 MG PO TABS: 500.0000 mg | ORAL_TABLET | Freq: Four times a day (QID) | ORAL | 0 refills | Status: DC | PRN

## 2021-07-31 NOTE — Patient Instructions (Addendum)
   Have an xray today downstairs.    Medications changes include :   methocarbamol 4 times a day as needed for muscle tightness.  Losartan 12.5 - 25 mg daily.     Your prescription(s) have been submitted to your pharmacy. Please take as directed and contact our office if you believe you are having problem(s) with the medication(s).   A referral was ordered for sports medicine.       Someone from their office will call you to schedule an appointment.

## 2021-07-31 NOTE — Assessment & Plan Note (Signed)
Acute Likely musculoskeletal in nature She has had something like this in the past and thought at that time it was musculoskeletal as well She just recently had surgery for bladder cancer and he is concerned about the possibility of cancer.  She did just see the urologist and has had imaging of her kidneys-reassured that there is no evidence of cancer in her kidneys are okay Will obtain x-ray of ribs today for reassurance and to make sure that there is good alignment Will start methocarbamol 500 mg every 6 hours as needed Can take Tylenol as needed She does have stronger pain medication at home and she can take if needed Can try ice, heat Will refer to sports medicine for further evaluation and treatment

## 2021-08-07 ENCOUNTER — Ambulatory Visit (INDEPENDENT_AMBULATORY_CARE_PROVIDER_SITE_OTHER): Payer: Medicare HMO | Admitting: Sports Medicine

## 2021-08-07 ENCOUNTER — Ambulatory Visit (HOSPITAL_COMMUNITY)
Admission: RE | Admit: 2021-08-07 | Discharge: 2021-08-07 | Disposition: A | Discharge status: Home / Self Care | Payer: Medicare HMO | Source: Ambulatory Visit | Attending: Sports Medicine | Admitting: Sports Medicine

## 2021-08-07 ENCOUNTER — Other Ambulatory Visit: Payer: Self-pay

## 2021-08-07 ENCOUNTER — Ambulatory Visit (INDEPENDENT_AMBULATORY_CARE_PROVIDER_SITE_OTHER): Payer: Medicare HMO

## 2021-08-07 VITALS — BP 118/82 | HR 67 | Ht 62.0 in | Wt 185.8 lb

## 2021-08-07 DIAGNOSIS — G2589 Other specified extrapyramidal and movement disorders: Secondary | ICD-10-CM

## 2021-08-07 DIAGNOSIS — M545 Low back pain, unspecified: Secondary | ICD-10-CM

## 2021-08-07 DIAGNOSIS — R102 Pelvic and perineal pain: Secondary | ICD-10-CM | POA: Diagnosis not present

## 2021-08-07 MED ORDER — MELOXICAM 7.5 MG PO TABS: 7.5000 mg | ORAL_TABLET | Freq: Every day | ORAL | 0 refills | Status: DC

## 2021-08-07 NOTE — Progress Notes (Signed)
Angel Williamson D.[place] Phone: [phone]   Assessment and Plan:     1. Acute bilateral low/mid back pain, unspecified whether sciatica present 2. 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  Start HEP for scapular dyskinesis - Start meloxicam 7.5 mg daily x2 weeks.  May use remainder as needed for pain control - May continue previously prescribed muscle relaxer as needed for relief - X-rays obtained in clinic.  My interpretation: Grade 1 retrolisthesis of L5 compared to L4 without acute fracture.  Disc space maintained.  Mild cortical irregularities over femoral head and acetabulum indicating mild hip OA bilaterally.  No acute fracture - DG Lumbar Spine 2-3 Views; Future - DG Pelvis 1-2 Views; Future - Ambulatory referral to Physical Therapy    Pertinent previous records reviewed include x-ray chest   Follow Up: In 4 weeks for reevaluation.  Would consider OMT versus trigger point injection at that time   Subjective:   I, Angel Williamson, am serving as a scribe for Dr. Glennon Mac  Chief Complaint: Left sided mid back pain   HPI:   08/07/21 Patient is a 65 year old female presenting with left sided mid back pain. Patient was seen by her PCP 07/31/2021 and stated constant back pain going on for 2 months. Patient states the pain is only with pressure (wearing a bra)and leaning, describes pain as a burning pain. Patient had an Xray of her ribs on 10/3 and given Methocarbamol and instructed to take tylenol as needed for pain. Patient has also tried Ice and heat to help with pain. Patient states today that the muscle relaxer is helping but she still is having the pan, but not as bad as it was. Patient states that the pain is very  aggravating, patient was diagnoses with bladder cancer a while ago so she was worried, but she was reassured it was not because of that.  Relevant Historical Information: History of bladder cancer  Additional pertinent review of systems negative.   Current Outpatient Medications:    Calcium Carbonate (CALCIUM 500 PO), Take 1,000 mg by mouth daily., Disp: , Rfl:    Cholecalciferol (D3 ADULT PO), Take 2,000 Units by mouth daily., Disp: , Rfl:    EPINEPHrine 0.3 mg/0.3 mL IJ SOAJ injection, Use as directed for life-threatening allergic reaction., Disp: 2 each, Rfl: 3   HYDROcodone-acetaminophen (NORCO/VICODIN) 5-325 MG tablet, Take 1 tablet by mouth every 6 (six) hours as needed for moderate pain., Disp: 30 tablet, Rfl: 0   LORazepam (ATIVAN) 0.5 MG tablet, Take 0.5 mg by mouth every 8 (eight) hours as needed for anxiety., Disp: , Rfl:    losartan (COZAAR) 25 MG tablet, Take 1 tablet (25 mg total) by mouth daily., Disp: 30 tablet, Rfl: 5   meloxicam (MOBIC) 7.5 MG tablet, Take 1 tablet (7.5 mg total) by mouth daily., Disp: 30 tablet, Rfl: 0   methocarbamol (ROBAXIN) 500 MG tablet, Take 1 tablet (500 mg total) by mouth every 6 (six) hours as needed for muscle spasms., Disp: 30 tablet, Rfl: 0   Objective:     Vitals:   08/07/21 1334  BP: 118/82  Pulse: 67  SpO2: 99%  Weight: 185 lb 12.8 oz (84.3 kg)  Height: 5\' 2"  (1.575 m)      Body  mass index is 33.98 kg/m.    Physical Exam:    Gen: Appears well, nad, nontoxic and pleasant Psych: Alert and oriented, appropriate mood and affect Neuro: sensation intact, strength is 5/5 in upper and lower extremities, muscle tone wnl Skin: no susupicious lesions or rashes  Back - Normal skin, Spine with normal alignment and no deformity.   No tenderness to vertebral process palpation.   Paraspinous muscles are not tender and without spasm Trendelenberg negative   Left shoulder: no deformity, swelling or muscle wasting scapular winging on left  compared to right FF 180, abd 180, int 0, ext 90 TTP left rhomboids, levator FROM of neck    Electronically signed by:  Angel Williamson D.[facility] 2:04 PM 08/07/21

## 2021-08-07 NOTE — Patient Instructions (Addendum)
Good to see you  Referral to physical therapy for scapular dyskinesis Home exercises for scapular dyskinesis  Meloxicam 7.5mg  daily for 2 weeks remainder as needed  See me again in 4 weeks

## 2021-08-15 DIAGNOSIS — G4733 Obstructive sleep apnea (adult) (pediatric): Secondary | ICD-10-CM | POA: Diagnosis not present

## 2021-08-21 ENCOUNTER — Ambulatory Visit: Payer: Medicare HMO | Attending: Sports Medicine

## 2021-08-21 ENCOUNTER — Other Ambulatory Visit: Payer: Self-pay

## 2021-08-21 DIAGNOSIS — G2589 Other specified extrapyramidal and movement disorders: Secondary | ICD-10-CM | POA: Insufficient documentation

## 2021-08-21 DIAGNOSIS — M546 Pain in thoracic spine: Secondary | ICD-10-CM | POA: Insufficient documentation

## 2021-08-21 DIAGNOSIS — M6281 Muscle weakness (generalized): Secondary | ICD-10-CM | POA: Insufficient documentation

## 2021-08-21 NOTE — Therapy (Signed)
Angel Williamson, [place], [address] Phone: [phone]   Fax:  [phone]  Physical Therapy Evaluation  Patient Details  Name: Angel Williamson MRN: [id number] Date of Birth: [birth year] Referring Provider (PT): Glennon Mac, DO   Encounter Date: 08/21/2021   PT End of Session - 08/21/21 1119     Visit Number 1    Number of Visits 17    Date for PT Re-Evaluation 10/16/21    Authorization Type [employer] MCR    Progress Note Due on Visit 10    PT Start Time 7026    PT Stop Time 1115    PT Time Calculation (min) 43 min    Activity Tolerance Patient tolerated treatment well;No increased pain    Behavior During Therapy WFL for tasks assessed/performed             Past Medical History:  Diagnosis Date   Bladder cancer Angel Williamson)    urologist-- dr Louis Meckel   Breast fibrocystic disorder    History of adenomatous polyp of colon    History of COVID-19 03/2021   per pt positive home test ,  mild symptoms that resolved   Hypertension    followed by pcp    (ETT in epic 07-09-2014 normal)   Lung nodule    followed by dr Halford Chessman   OSA on CPAP    followed by dr Halford Chessman--- study in epic 08-07-20107 severe osa   Osteopenia    Renal cyst, left    Varicose veins of both lower extremities     Past Surgical History:  Procedure Laterality Date   COLONOSCOPY  2013   NO PAST SURGERIES     TRANSURETHRAL RESECTION OF BLADDER TUMOR WITH MITOMYCIN-C Bilateral 07/06/2021   Procedure: TRANSURETHRAL RESECTION OF BLADDER TUMOR BILATERAL RETROGRADE PYELOGRAM WITH GEMCITABINE INSTILLATION IN PACU;  Surgeon: Ardis Hughs, MD;  Location: [facility];  Service: Urology;  Laterality: Bilateral;    There were no vitals filed for this visit.    Subjective Assessment - 08/21/21 1036     Subjective Pt presents to PT with reports of L scapular pain and discomfort for last 3 months. Notes pain is intermittent and worst  in morning. Denies b/b changes or saddle anesthesia. Does note referral of pain into upper thoracic and to the R side as well. No traume, gradual onset noted and worsening posture seems to increase symptoms. Used to be more active and would like to get back to exercising more regularly.    Pertinent History 3 month hx of L periscapular pain or discomfort    Limitations Sitting    How long can you sit comfortably? 10-15 min    How long can you stand comfortably? indefinite    How long can you walk comfortably? indefinite    Patient Stated Goals Pt would like to decrease mid back pain and improve comfort with ADL and yard work    Currently in Pain? Yes    Pain Score 4    6/10 at worst; 0/10 at best   Pain Location Back    Pain Orientation Left;Mid    Pain Descriptors / Indicators Aching;Dull    Pain Type Chronic pain    Pain Onset More than a month ago    Pain Frequency Intermittent    Aggravating Factors  proloonged sitting, posture    Pain Relieving Factors medications           [facility] Adult PT Treatment/Exercise:  Therapeutic Exercise: Row x 10 blue tband Seated horizontal abd x 10 red tband L rhomboid stretch x 30 sec Corner pec stretch x 30 sec     [facility] PT Assessment - 08/21/21 0001       Assessment   Medical Diagnosis G25.89 (ICD-10-CM) - Scapular dyskinesis    Referring Provider (PT) Glennon Mac, DO    Hand Dominance Right      Precautions   Precautions None      Restrictions   Weight Bearing Restrictions No      Balance Screen   Has the patient fallen in the past 6 months No    Has the patient had a decrease in activity level because of a fear of falling?  No    Is the patient reluctant to leave their home because of a fear of falling?  No      Home Environment   Living Environment Private residence    Living Arrangements Spouse/significant other    Type of [place];Independent with basic  ADLs      Cognition   Overall Cognitive Status Within Functional Limits for tasks assessed    Attention Focused      Observation/Other Assessments   Observations smooth scapular tracking during L shoulder abd/flex    Focus on Therapeutic Outcomes (FOTO)  51% function; 69% predicted      Posture/Postural Control   Posture Comments rounded shoulders, fwd head, medium body habitus      Strength   Overall Strength Comments middle/lower trap and rhomboid: 3+/5 on L      Palpation   Palpation comment TTP to L rhomboid major, L levator                        Objective measurements completed on examination: See above findings.                PT Education - 08/21/21 1118     Education Details eval findings, FOTO, HEP, POC    Person(s) Educated Patient    Methods Explanation;Demonstration;Handout    Comprehension Verbalized understanding;Returned demonstration              PT Short Term Goals - 08/21/21 1046       PT SHORT TERM GOAL #1   Title Pt will be compliant with initial HEP for improved comfort and function    Baseline initial HEP given    Time 3    Period Weeks    Status New    Target Date 09/11/21               PT Long Term Goals - 08/21/21 1047       PT LONG TERM GOAL #1   Title Pt will improve FOTO function score to no less than 51% as proxy for functional improvement    Baseline 51% function, 69% predicted    Time 8    Period Weeks    Status New    Target Date 10/16/21      PT LONG TERM GOAL #2   Title Pt will self report periscapular pain no greater than 2/10 at worst for improved comfort and function    Baseline 6/10 at worst    Time 8    Period Weeks    Status New    Target Date 10/16/21      PT LONG TERM GOAL #3  Title Pt will improve L rhomboid and middle/lower trap strength no less than 4/5 for improved functional ability    Baseline see flowsheet    Time 8    Period Weeks    Status New    Target Date  10/16/21                    Plan - 08/21/21 1119     Clinical Impression Statement Pt is a pleasant 65 y/o F who presents to PT with reports of L periscapular pain and discomfort. Physical findings are consistent with MD impression, as pt has thoracic pain and decreased mobility, along with TTP to L periscapular (rhomboid) musculature. Her FOTO score indicates she is operating below PLOF and would benefit from skilled PT services working on improving periscapular strength and thoracic mobility in order to decrease pain and improve function.    Personal Factors and Comorbidities Comorbidity 2;Fitness    Comorbidities PMH: HTN, Bladder Cancer    Examination-Activity Limitations Reach Overhead;Sit    Examination-Participation Restrictions Yard Work;Community Activity    Stability/Clinical Decision Making Stable/Uncomplicated    Clinical Decision Making Low    Rehab Potential Good    PT Frequency 2x / week    PT Duration 8 weeks    PT Treatment/Interventions ADLs/Self Care Home Management;Electrical Stimulation;Moist Heat;Cryotherapy;Gait training;Stair training;Functional mobility training;Therapeutic activities;Therapeutic exercise;Balance training;Neuromuscular re-education;Patient/family education;Manual techniques;Dry needling;Taping;Spinal Manipulations;Joint Manipulations    PT Next Visit Plan assess response to HEP; progress periscapular strength, start thoracic mobility exercises, manual to decrease pain    PT Home Exercise Plan Access Code: [email or web address]    Consulted and Agree with Plan of Care Patient             Patient will benefit from skilled therapeutic intervention in order to improve the following deficits and impairments:  Decreased activity tolerance, Decreased endurance, Decreased mobility, Pain, Decreased strength  Visit Diagnosis: Pain in thoracic spine  Muscle weakness (generalized)     Problem List Patient Active Problem List   Diagnosis Date Noted    Bladder cancer ([place]) 07/01/2021   Aortic atherosclerosis ([place]) 06/28/2021   Benign cyst of left kidney 06/17/2021   Urinary frequency 06/02/2021   Gross hematuria 06/02/2021   Left flank pain 06/02/2021   Paresthesia 02/06/2021   Contact dermatitis 02/06/2021   Allergic dermatitis 01/30/2021   Vitamin D deficiency 01/10/2021   Varicose veins of both lower extremities 12/06/2020   Mid back pain on left side 06/27/2020   Dizziness 10/05/2019   Constipation 10/10/2018   Right-sided low back pain without sciatica 09/23/2018   Rectal discharge 09/23/2018   Anxiety 07/22/2017   OSA (obstructive sleep apnea), Severe 08/22/2016   Prediabetes 01/11/2016   Obesity 01/11/2016   Skin cancer, basal cell 04/18/2012   COLONIC POLYPS, HX OF 12/13/2009   Osteopenia 03/29/2009   Essential hypertension 11/14/2007    Ward Chatters, PT 08/21/2021, 11:25 AM  [facility] [address] [place], [place], [address] Phone: [phone]   Fax:  [phone]  Name: Angel Williamson MRN: [id number] Date of Birth: [birth year]  Referring diagnosis? G25.89 (ICD-10-CM) - Scapular dyskinesis Treatment diagnosis? (if different than referring diagnosis)  Pain in thoracic spine Muscle weakness (generalized) What was this (referring dx) caused by? []  Surgery []  Fall [x]  Ongoing issue []  Arthritis []  Other: ____________  Laterality: []  Rt [x]  Lt []  Both  Check all possible CPT codes:      [x]  97110 (Therapeutic Exercise)  [x]  92507 (SLP  Treatment)  [x]  97112 (Neuro Re-ed)   []  92526 (Swallowing Treatment)   [x]  97116 (Gait Training)   []  [phone] (Cognitive Training, 1st 15 minutes) [x]  97140 (Manual Therapy)   []  97130 (Cognitive Training, each add'l 15 minutes)  [x]  97530 (Therapeutic Activities)  []  Other, List CPT Code ____________    [x]  97535 (Self Care)       []  All codes above (97110 - 97535)  []  97012 (Mechanical Traction)  []  97014 (E-stim  Unattended)  []  97032 (E-stim manual)  []  97033 (Ionto)  []  97035 (Ultrasound)  []  97760 (Orthotic Fit) []  78588 (Physical Performance Training) []  [id number] (Aquatic Therapy) []  [id number] (Contrast Bath) []  [id number] (Paraffin) []  97597 (Wound Care 1st 20 sq cm) []  97598 (Wound Care each add'l 20 sq cm) []  97016 (Vasopneumatic Device) []  [id number] Comptroller) []  [id number] (Prosthetic Training)

## 2021-08-24 ENCOUNTER — Telehealth: Payer: Self-pay | Admitting: Internal Medicine

## 2021-08-24 MED ORDER — LORAZEPAM 0.5 MG PO TABS: 0.5000 mg | ORAL_TABLET | Freq: Three times a day (TID) | ORAL | 0 refills | Status: DC | PRN

## 2021-08-24 NOTE — Telephone Encounter (Signed)
1.Medication Requested: LORazepam (ATIVAN) 0.5 MG tablet  2. Pharmacy (Name, Street, [place]): [id number] - [place], [place]  3. On Med List: yes  4. Last Visit with PCP: 07-31-2021  5. Next visit date with PCP: n/a   Agent: Please be advised that RX refills may take up to 3 business days. We ask that you follow-up with your pharmacy.

## 2021-08-28 ENCOUNTER — Ambulatory Visit: Payer: Medicare HMO | Admitting: Physical Therapy

## 2021-08-28 ENCOUNTER — Encounter: Payer: Self-pay | Admitting: Physical Therapy

## 2021-08-28 ENCOUNTER — Other Ambulatory Visit: Payer: Self-pay

## 2021-08-28 DIAGNOSIS — M546 Pain in thoracic spine: Secondary | ICD-10-CM

## 2021-08-28 DIAGNOSIS — M6281 Muscle weakness (generalized): Secondary | ICD-10-CM | POA: Diagnosis not present

## 2021-08-28 DIAGNOSIS — G2589 Other specified extrapyramidal and movement disorders: Secondary | ICD-10-CM | POA: Diagnosis not present

## 2021-08-28 NOTE — Patient Instructions (Signed)
Access Code: [email or web address] URL: [email or web address] Date: 08/28/2021 Prepared by: Hessie Diener  Exercises Standing Shoulder Row with Anchored Resistance - 1 x daily - 7 x weekly - 3 sets - 10 reps - 3 sec hold Seated Shoulder Horizontal Abduction with Resistance - Palms Down - 1 x daily - 7 x weekly - 3 sets - 10 reps Standing Shoulder Posterior Capsule Stretch - 1 x daily - 7 x weekly - 1 sets - 2 reps - 30 sec hold Corner Pec Major Stretch - 1 x daily - 7 x weekly - 1 sets - 2 reps - 30 sec hold Sidelying Thoracic Rotation with Open Book - 1 x daily - 7 x weekly - 1 sets - 10 reps - 10 hold Thoracic Extension Mobilization with Noodle - 1 x daily - 7 x weekly - 1 sets - 1 reps - 60 hold

## 2021-08-28 NOTE — Therapy (Addendum)
[place], [place], [address] Phone: [phone]   Fax:  [phone]  Physical Therapy Treatment/Discharge  Patient Details  Name: Angel Williamson MRN: [id number] Date of Birth: 09/21/1956 Referring Provider (PT): Glennon Mac, DO   Encounter Date: 08/28/2021   PT End of Session - 08/28/21 0934     Visit Number 2    Number of Visits 17    Date for PT Re-Evaluation 10/16/21    Authorization Type [employer] MCR    Progress Note Due on Visit 10    PT Start Time 0927    PT Stop Time 1005    PT Time Calculation (min) 38 min             Past Medical History:  Diagnosis Date   Bladder cancer [facility])    urologist-- dr Louis Meckel   Breast fibrocystic disorder    History of adenomatous polyp of colon    History of COVID-19 03/2021   per pt positive home test ,  mild symptoms that resolved   Hypertension    followed by pcp    (ETT in epic 07-09-2014 normal)   Lung nodule    followed by dr Halford Chessman   OSA on CPAP    followed by dr Halford Chessman--- study in epic 08-07-20107 severe osa   Osteopenia    Renal cyst, left    Varicose veins of both lower extremities     Past Surgical History:  Procedure Laterality Date   COLONOSCOPY  2013   NO PAST SURGERIES     TRANSURETHRAL RESECTION OF BLADDER TUMOR WITH MITOMYCIN-C Bilateral 07/06/2021   Procedure: TRANSURETHRAL RESECTION OF BLADDER TUMOR BILATERAL RETROGRADE PYELOGRAM WITH GEMCITABINE INSTILLATION IN PACU;  Surgeon: Ardis Hughs, MD;  Location: [facility];  Service: Urology;  Laterality: Bilateral;    There were no vitals filed for this visit.   Subjective Assessment - 08/28/21 0932     Subjective It is a whole lot better than it was.    Currently in Pain? No/denies             [facility] Adult PT Treatment/Exercise:  Therapeutic Exercise: - UBE Level 2 ; 2 minutes each way  -Row blue band 10 x 3  -Standing horizontal abduction Red  band 10 x 3  - standing bilateral shoulder ER red band 10 x 2  -corner stretch  2 x 30 sec  -posterior capsule stretch 2 x 30 sec  -hooklying thoracic extension over rolled yoga mat- with UE flexion x 10  -upper trunk rotation from sidelying (open books) x 10 each way  Manual Therapy: -   Neuromuscular re-ed: -   Therapeutic Activity: -   Self-care/Home Management: -  use of tennis ball for self trigger point release- standing  at wall.      PT Education - 08/28/21 1011     Education Details HEP    Person(s) Educated Patient    Methods Explanation;Handout    Comprehension Verbalized understanding              PT Short Term Goals - 08/21/21 1046       PT SHORT TERM GOAL #1   Title Pt will be compliant with initial HEP for improved comfort and function    Baseline initial HEP given    Time 3    Period Weeks    Status New    Target Date 09/11/21  PT Long Term Goals - 08/21/21 1047       PT LONG TERM GOAL #1   Title Pt will improve FOTO function score to no less than 51% as proxy for functional improvement    Baseline 51% function, 69% predicted    Time 8    Period Weeks    Status New    Target Date 10/16/21      PT LONG TERM GOAL #2   Title Pt will self report periscapular pain no greater than 2/10 at worst for improved comfort and function    Baseline 6/10 at worst    Time 8    Period Weeks    Status New    Target Date 10/16/21      PT LONG TERM GOAL #3   Title Pt will improve L rhomboid and middle/lower trap strength no less than 4/5 for improved functional ability    Baseline see flowsheet    Time 8    Period Weeks    Status New    Target Date 10/16/21                   Plan - 08/28/21 1007     Clinical Impression Statement Pt reports improvement in pain since starting her HEP. She has no pain at start of session. Reviewed HEP and added thoracic mobility. Instructed pt in self trigger point release to  periscapular muscles bilateral. She reported feeling like she had worked out at the end of session. No increased pain. She was given an updated HEP.    PT Treatment/Interventions ADLs/Self Care Home Management;Electrical Stimulation;Moist Heat;Cryotherapy;Gait training;Stair training;Functional mobility training;Therapeutic activities;Therapeutic exercise;Balance training;Neuromuscular re-education;Patient/family education;Manual techniques;Dry needling;Taping;Spinal Manipulations;Joint Manipulations    PT Next Visit Plan assess response to HEP; progress periscapular strength, start thoracic mobility exercises, manual to decrease pain    PT Home Exercise Plan Access Code: [email or web address]    Consulted and Agree with Plan of Care Patient             Patient will benefit from skilled therapeutic intervention in order to improve the following deficits and impairments:  Decreased activity tolerance, Decreased endurance, Decreased mobility, Pain, Decreased strength  Visit Diagnosis: Pain in thoracic spine  Muscle weakness (generalized)     Problem List Patient Active Problem List   Diagnosis Date Noted   Bladder cancer ([place]) 07/01/2021   Aortic atherosclerosis ([place]) 06/28/2021   Benign cyst of left kidney 06/17/2021   Urinary frequency 06/02/2021   Gross hematuria 06/02/2021   Left flank pain 06/02/2021   Paresthesia 02/06/2021   Contact dermatitis 02/06/2021   Allergic dermatitis 01/30/2021   Vitamin D deficiency 01/10/2021   Varicose veins of both lower extremities 12/06/2020   Mid back pain on left side 06/27/2020   Dizziness 10/05/2019   Constipation 10/10/2018   Right-sided low back pain without sciatica 09/23/2018   Rectal discharge 09/23/2018   Anxiety 07/22/2017   OSA (obstructive sleep apnea), Severe 08/22/2016   Prediabetes 01/11/2016   Obesity 01/11/2016   Skin cancer, basal cell 04/18/2012   COLONIC POLYPS, HX OF 12/13/2009   Osteopenia 03/29/2009   Essential  hypertension 11/14/2007    Dorene Ar, PTA 08/28/2021, 10:11 AM  [place] [facility] [address] [place], [place], [address] Phone: [phone]   Fax:  [phone]  Name: Angel Williamson MRN: [id number] Date of Birth: [birth year]   PHYSICAL THERAPY DISCHARGE SUMMARY  Visits from Start of Care: 2  Current functional level related to  goals / functional outcomes: Unable to assess   Remaining deficits: Unable to assess   Education / Equipment: N/A   Patient agrees to discharge. Patient goals were  unable to assess . Patient is being discharged due to not returning since the last visit.

## 2021-09-03 ENCOUNTER — Other Ambulatory Visit: Payer: Self-pay | Admitting: Sports Medicine

## 2021-09-04 ENCOUNTER — Ambulatory Visit: Payer: Medicare HMO | Admitting: Sports Medicine

## 2021-09-04 ENCOUNTER — Other Ambulatory Visit: Payer: Self-pay

## 2021-09-04 VITALS — BP 122/84 | HR 57 | Ht 62.0 in | Wt 185.0 lb

## 2021-09-04 DIAGNOSIS — G2589 Other specified extrapyramidal and movement disorders: Secondary | ICD-10-CM | POA: Diagnosis not present

## 2021-09-04 DIAGNOSIS — M545 Low back pain, unspecified: Secondary | ICD-10-CM | POA: Diagnosis not present

## 2021-09-04 NOTE — Patient Instructions (Signed)
Good to see you  Continue with physical therapy  Use meloxicam and muscle relaxer as needed use tylenol for pain See me again in 2-4 weeks if not better

## 2021-09-04 NOTE — Progress Notes (Signed)
Angel Williamson D.[place] Phone: [phone]   Assessment and Plan:     1. Acute bilateral low back pain, unspecified whether sciatica present -Subacute, improving, subsequent visit - Generalized improvement low back pain - Discontinue regular use of meloxicam and muscle relaxer.  May use it as needed for pain flares - Continue HEP  2. Scapular dyskinesis, left -Subacute, improving, subsequent visit - Mild improvement in coordination of left scapula with decreased level of outflow - Continue PT for scapular dyskinesis -- Discontinue regular use of meloxicam and muscle relaxer.  May use it as needed for pain flares   Pertinent previous records reviewed include PT note x2   Follow Up: 2 to 4 weeks if no improvement or worsening of symptoms.  Could consider OMT versus trigger point injections at that time   Subjective:   I, Angel Williamson, am serving as a scribe for Dr. Glennon Mac  Chief Complaint: Low back pain follow up   HPI:   08/07/21 Patient is a 65 year old female presenting with left sided mid back pain. Patient was seen by her PCP 07/31/2021 and stated constant back pain going on for 2 months. Patient states the pain is only with pressure (wearing a bra)and leaning, describes pain as a burning pain. Patient had an Xray of her ribs on 10/3 and given Methocarbamol and instructed to take tylenol as needed for pain. Patient has also tried Ice and heat to help with pain. Patient states today that the muscle relaxer is helping but she still is having the pan, but not as bad as it was. Patient states that the pain is very aggravating, patient was diagnoses with bladder cancer a while ago so she was worried, but she was reassured it was not because of that.  09/04/21 Patient states that she has been doing better, but then went to PT and the position she was in made it painful. Since then has been  better. Laying or sitting back on something hard is what makes the pain worse. Patient states the meloxicam and muscle relaxer was helpful, but has not had to use it much as of lately. States she can get by on tylenol.    Relevant Historical Information: History of bladder cancer  Additional pertinent review of systems negative.   Current Outpatient Medications:    Calcium Carbonate (CALCIUM 500 PO), Take 1,000 mg by mouth daily., Disp: , Rfl:    Cholecalciferol (D3 ADULT PO), Take 2,000 Units by mouth daily., Disp: , Rfl:    EPINEPHrine 0.3 mg/0.3 mL IJ SOAJ injection, Use as directed for life-threatening allergic reaction., Disp: 2 each, Rfl: 3   LORazepam (ATIVAN) 0.5 MG tablet, Take 1 tablet (0.5 mg total) by mouth every 8 (eight) hours as needed for anxiety., Disp: 30 tablet, Rfl: 0   losartan (COZAAR) 25 MG tablet, Take 1 tablet (25 mg total) by mouth daily., Disp: 30 tablet, Rfl: 5   Objective:     Vitals:   09/04/21 1328  BP: 122/84  Pulse: (!) 57  SpO2: 99%  Weight: 185 lb (83.9 kg)  Height: 5\' 2"  (1.575 m)      Body mass index is 33.84 kg/m.    Physical Exam:    Gen: Appears well, nad, nontoxic and pleasant Psych: Alert and oriented, appropriate mood and affect Neuro: sensation intact, strength is 5/5 in upper and lower extremities, muscle tone wnl Skin: no susupicious lesions or rashes  Back - Normal skin, Spine with normal alignment and no deformity.   No tenderness to vertebral process palpation.   Paraspinous muscles are not tender and without spasm Trendelenberg negative    Left shoulder: no deformity, swelling or muscle wasting scapular winging on left compared to right although decreased compared to last visit FF 180, abd 180, int 0, ext 90 TTP left rhomboid FROM of neck   Electronically signed by:  Angel Williamson D.[facility] 1:53 PM 09/04/21

## 2021-09-07 ENCOUNTER — Other Ambulatory Visit: Payer: Self-pay

## 2021-09-07 ENCOUNTER — Ambulatory Visit: Payer: Medicare HMO | Admitting: Sports Medicine

## 2021-09-07 VITALS — BP 130/86 | Ht 62.0 in | Wt 182.0 lb

## 2021-09-07 DIAGNOSIS — M545 Low back pain, unspecified: Secondary | ICD-10-CM | POA: Diagnosis not present

## 2021-09-07 DIAGNOSIS — G2589 Other specified extrapyramidal and movement disorders: Secondary | ICD-10-CM

## 2021-09-07 NOTE — Progress Notes (Addendum)
Angel Williamson D.[place] Phone: [phone]   Assessment and Plan:     1. Acute bilateral low back pain, unspecified whether sciatica present -Acute, initial sports medicine visit - New right lower back pain in quadratus lumborum region likely strained while doing thoracic exercises at home - Restart meloxicam 7.5 mg daily for 1 week - Restart Robaxin 1 tablet nightly for 1 week -Start low back exercises and continue upper thoracic exercises that do not exacerbate pain  2. Scapular dyskinesis -Acute, improving - Improved coordination of scapular motion with decreased thoracic pain   Pertinent previous records reviewed include none   Follow Up: In 2 weeks for reevaluation of new lower back pain.  Could consider OMT   Subjective:   I, Angel Williamson, am serving as a Education administrator for Dr. Glennon Mac.  Chief Complaint: Lob back pain fllow up   HPI:   08/07/21 Patient is a 65 year old female presenting with left sided mid back pain. Patient was seen by her PCP 07/31/2021 and stated constant back pain going on for 2 months. Patient states the pain is only with pressure (wearing a bra)and leaning, describes pain as a burning pain. Patient had an Xray of her ribs on 10/3 and given Methocarbamol and instructed to take tylenol as needed for pain. Patient has also tried Ice and heat to help with pain. Patient states today that the muscle relaxer is helping but she still is having the pan, but not as bad as it was. Patient states that the pain is very aggravating, patient was diagnoses with bladder cancer a while ago so she was worried, but she was reassured it was not because of that.   09/04/21 Patient states that she has been doing better, but then went to PT and the position she was in made it painful. Since then has been better. Laying or sitting back on something hard is what makes the pain worse. Patient states the  meloxicam and muscle relaxer was helpful, but has not had to use it much as of lately. States she can get by on tylenol.   09/07/21 Today patient states upper back is doing better. Lower back pain started 2 days ago. Constant ache, btu sharp when making certain movements. More on right than left.  Denies urinary symptoms, frequency, discharge, fever, chills.  Relevant Historical Information: History of bladder cancer  Additional pertinent review of systems negative.   Current Outpatient Medications:    Calcium Carbonate (CALCIUM 500 PO), Take 1,000 mg by mouth daily., Disp: , Rfl:    Cholecalciferol (D3 ADULT PO), Take 2,000 Units by mouth daily., Disp: , Rfl:    EPINEPHrine 0.3 mg/0.3 mL IJ SOAJ injection, Use as directed for life-threatening allergic reaction., Disp: 2 each, Rfl: 3   LORazepam (ATIVAN) 0.5 MG tablet, Take 1 tablet (0.5 mg total) by mouth every 8 (eight) hours as needed for anxiety., Disp: 30 tablet, Rfl: 0   losartan (COZAAR) 25 MG tablet, Take 1 tablet (25 mg total) by mouth daily., Disp: 30 tablet, Rfl: 5   Objective:     Vitals:   09/07/21 1502  BP: 130/86  Weight: 182 lb (82.6 kg)  Height: 5\' 2"  (1.575 m)      Body mass index is 33.29 kg/m.    Physical Exam:    Gen: Appears well, nad, nontoxic and pleasant Psych: Alert and oriented, appropriate mood and affect Neuro: sensation intact, strength is  5/5 in upper and lower extremities, muscle tone wnl Skin: no susupicious lesions or rashes  Back - Normal skin, Spine with normal alignment and no deformity.   No tenderness to vertebral process palpation.   Paraspinous muscles are not tender and without spasm Straight leg raise negative Trendelenberg negative  Pain with palpation of right flank  Electronically signed by:  Angel Williamson D.[facility] 3:38 PM 09/07/21

## 2021-09-07 NOTE — Patient Instructions (Signed)
Do prescribed exercises at least 3x a week Take Meloxicam 7.5mg  daily for 1 week Then robaxin nightly for 1 week Follow up in 2 weeks

## 2021-09-07 NOTE — Progress Notes (Signed)
Angel Williamson D.[place] Phone: [phone]   Assessment and Plan:     1. Acute bilateral low back pain, unspecified whether sciatica present -Acute, initial sports medicine visit - New right lower back pain in quadratus lumborum region likely strained while doing thoracic exercises at home - Restart meloxicam 7.5 mg daily for 1 week - Restart Robaxin 1 tablet nightly for 1 week -Start low back exercises and continue upper thoracic exercises that do not exacerbate pain  2. Scapular dyskinesis -Acute, improving - Improved coordination of scapular motion with decreased thoracic pain   Pertinent previous records reviewed include none   Follow Up: In 2 weeks for reevaluation of new lower back pain.  Could consider OMT   Subjective:   I, Angel Williamson, am serving as a Education administrator for Dr. Hulan Saas.  Chief Complaint: Lob back pain fllow up   HPI:   08/07/21 Patient is a 65 year old female presenting with left sided mid back pain. Patient was seen by her PCP 07/31/2021 and stated constant back pain going on for 2 months. Patient states the pain is only with pressure (wearing a bra)and leaning, describes pain as a burning pain. Patient had an Xray of her ribs on 10/3 and given Methocarbamol and instructed to take tylenol as needed for pain. Patient has also tried Ice and heat to help with pain. Patient states today that the muscle relaxer is helping but she still is having the pan, but not as bad as it was. Patient states that the pain is very aggravating, patient was diagnoses with bladder cancer a while ago so she was worried, but she was reassured it was not because of that.   09/04/21 Patient states that she has been doing better, but then went to PT and the position she was in made it painful. Since then has been better. Laying or sitting back on something hard is what makes the pain worse. Patient states the  meloxicam and muscle relaxer was helpful, but has not had to use it much as of lately. States she can get by on tylenol.   09/07/21 Today patient states upper back is doing better. Lower back pain started 2 days ago. Constant ache, btu sharp when making certain movements. More on right than left.  Denies urinary symptoms, frequency, discharge, fever, chills.  Relevant Historical Information: History of bladder cancer  Additional pertinent review of systems negative.   Current Outpatient Medications:    Calcium Carbonate (CALCIUM 500 PO), Take 1,000 mg by mouth daily., Disp: , Rfl:    Cholecalciferol (D3 ADULT PO), Take 2,000 Units by mouth daily., Disp: , Rfl:    EPINEPHrine 0.3 mg/0.3 mL IJ SOAJ injection, Use as directed for life-threatening allergic reaction., Disp: 2 each, Rfl: 3   LORazepam (ATIVAN) 0.5 MG tablet, Take 1 tablet (0.5 mg total) by mouth every 8 (eight) hours as needed for anxiety., Disp: 30 tablet, Rfl: 0   losartan (COZAAR) 25 MG tablet, Take 1 tablet (25 mg total) by mouth daily., Disp: 30 tablet, Rfl: 5   Objective:     Vitals:   09/07/21 1502  BP: 130/86  Weight: 182 lb (82.6 kg)  Height: 5\' 2"  (1.575 m)      Body mass index is 33.29 kg/m.    Physical Exam:    Gen: Appears well, nad, nontoxic and pleasant Psych: Alert and oriented, appropriate mood and affect Neuro: sensation intact, strength is  5/5 in upper and lower extremities, muscle tone wnl Skin: no susupicious lesions or rashes  Back - Normal skin, Spine with normal alignment and no deformity.   No tenderness to vertebral process palpation.   Paraspinous muscles are not tender and without spasm Straight leg raise negative Trendelenberg negative  Pain with palpation of right flank  Electronically signed by:  Angel Williamson D.[facility] 3:20 PM 09/07/21

## 2021-09-11 ENCOUNTER — Ambulatory Visit: Payer: Medicare HMO | Admitting: Physical Therapy

## 2021-09-20 ENCOUNTER — Encounter: Payer: Medicare HMO | Admitting: Physical Therapy

## 2021-09-25 ENCOUNTER — Other Ambulatory Visit: Payer: Self-pay

## 2021-09-25 ENCOUNTER — Ambulatory Visit: Payer: Medicare HMO | Admitting: Sports Medicine

## 2021-09-25 VITALS — BP 132/86 | HR 63 | Resp 97 | Ht 62.0 in | Wt 184.0 lb

## 2021-09-25 DIAGNOSIS — M545 Low back pain, unspecified: Secondary | ICD-10-CM

## 2021-09-25 DIAGNOSIS — G2589 Other specified extrapyramidal and movement disorders: Secondary | ICD-10-CM | POA: Diagnosis not present

## 2021-09-25 NOTE — Patient Instructions (Addendum)
Good to see you   Stop using meloxicam and Robaxin regularly.  May use remaining medication as needed for pain flares.  Recommend using Tylenol as needed for chronic pain management.  Continue home exercise program.  Follow-up in 2 weeks for reevaluation.  At that time we could consider doing osteopathic manipulative treatment (OMT)

## 2021-09-25 NOTE — Progress Notes (Signed)
Angel Williamson D.[place] Phone: [phone]   Assessment and Plan:     1. Acute bilateral low back pain, unspecified whether sciatica present -Acute, resolved - Completely resolved low back pain with rest, meloxicam, Robaxin - Discontinue meloxicam and Robaxin use  2. Scapular dyskinesis -Acute, mild improvement, subsequent visit - Improved coordination of scapular motion with continued thoracic discomfort - Continue HEP for scapular dyskinesis - Tylenol/NSAIDs as needed for pain control   Pertinent previous records reviewed include none   Follow Up: In 2 weeks for reevaluation of thoracic back pain.  Could consider OMT at that time   Subjective:   I, Angel Williamson, am serving as a scribe for Dr. Glennon Williamson  Chief Complaint: Low back pain follow up   HPI:  08/07/21 Patient is a 65 year old female presenting with left sided mid back pain. Patient was seen by her PCP 07/31/2021 and stated constant back pain going on for 2 months. Patient states the pain is only with pressure (wearing a bra)and leaning, describes pain as a burning pain. Patient had an Xray of her ribs on 10/3 and given Methocarbamol and instructed to take tylenol as needed for pain. Patient has also tried Ice and heat to help with pain. Patient states today that the muscle relaxer is helping but she still is having the pan, but not as bad as it was. Patient states that the pain is very aggravating, patient was diagnoses with bladder cancer a while ago so she was worried, but she was reassured it was not because of that.   09/04/21 Patient states that she has been doing better, but then went to PT and the position she was in made it painful. Since then has been better. Laying or sitting back on something hard is what makes the pain worse. Patient states the meloxicam and muscle relaxer was helpful, but has not had to use it much as of  lately. States she can get by on tylenol.    09/07/21 Today patient states upper back is doing better. Lower back pain started 2 days ago. Constant ache, btu sharp when making certain movements. More on right than left.  Denies urinary symptoms, frequency, discharge, fever, chills.  09/25/21 Today patient states the lower back pain is all better. But is still having her uppser back pain. Patent states the upper back pain is not as bad as it was at her first visit but it is still bothering her.   Relevant Historical Information: History of bladder cancer   Additional pertinent review of systems negative.   Current Outpatient Medications:    Calcium Carbonate (CALCIUM 500 PO), Take 1,000 mg by mouth daily., Disp: , Rfl:    Cholecalciferol (D3 ADULT PO), Take 2,000 Units by mouth daily., Disp: , Rfl:    EPINEPHrine 0.3 mg/0.3 mL IJ SOAJ injection, Use as directed for life-threatening allergic reaction., Disp: 2 each, Rfl: 3   LORazepam (ATIVAN) 0.5 MG tablet, Take 1 tablet (0.5 mg total) by mouth every 8 (eight) hours as needed for anxiety., Disp: 30 tablet, Rfl: 0   losartan (COZAAR) 25 MG tablet, Take 1 tablet (25 mg total) by mouth daily., Disp: 30 tablet, Rfl: 5   Objective:     Vitals:   09/25/21 1117  BP: 132/86  Pulse: 63  Resp: (!) 97  Weight: 184 lb (83.5 kg)  Height: 5\' 2"  (1.575 m)      Body  mass index is 33.65 kg/m.    Physical Exam:    Gen: Appears well, nad, nontoxic and pleasant Psych: Alert and oriented, appropriate mood and affect Neuro: sensation intact, strength is 5/5 in upper and lower extremities, muscle tone wnl Skin: no susupicious lesions or rashes  Back - Normal skin, Spine with normal alignment and no deformity.   No tenderness to vertebral process palpation.   Paraspinous muscles are mildly tender in thoracic spine and without spasm.  Nontender in lumbar spine Straight leg raise negative Trendelenberg negative    Electronically signed by:  Angel Williamson D.[facility] 11:30 AM 09/25/21

## 2021-10-06 NOTE — Progress Notes (Signed)
Benito Mccreedy D.[place] Phone: [phone]   Assessment and Plan:     1. Scapular dyskinesis -Chronic with exacerbation, subsequent visit - Moderate improvement in left scapular coordination, with mild improvement in pain - Continue home exercises for scapular dyskinesis - May use Tylenol/NSAIDs as needed for pain - Offered OMT at today's visit, however patient is unfamiliar with these techniques and would like to think about it.  Gave patient information to look up on OMT to see if she would like to try it at follow-up visit   Pertinent previous records reviewed include none   Follow Up: 2 to 4 weeks for reevaluation.  Could consider OMT if patient is agreeable   Subjective:    I, Moenique Parris, am serving as a Education administrator for Doctor Glennon Mac  Chief Complaint: left side back pain   HPI:  08/07/21 Patient is a 65 year old female presenting with left sided mid back pain. Patient was seen by her PCP 07/31/2021 and stated constant back pain going on for 2 months. Patient states the pain is only with pressure (wearing a bra)and leaning, describes pain as a burning pain. Patient had an Xray of her ribs on 10/3 and given Methocarbamol and instructed to take tylenol as needed for pain. Patient has also tried Ice and heat to help with pain. Patient states today that the muscle relaxer is helping but she still is having the pan, but not as bad as it was. Patient states that the pain is very aggravating, patient was diagnoses with bladder cancer a while ago so she was worried, but she was reassured it was not because of that.   09/04/21 Patient states that she has been doing better, but then went to PT and the position she was in made it painful. Since then has been better. Laying or sitting back on something hard is what makes the pain worse. Patient states the meloxicam and muscle relaxer was helpful, but has not had to use  it much as of lately. States she can get by on tylenol.    09/07/21 Today patient states upper back is doing better. Lower back pain started 2 days ago. Constant ache, btu sharp when making certain movements. More on right than left.  Denies urinary symptoms, frequency, discharge, fever, chills.   09/25/21 Today patient states the lower back pain is all better. But is still having her uppser back pain. Patent states the upper back pain is not as bad as it was at her first visit but it is still bothering her.   10/09/2021  Patient states that she is doing better, still having upper back pain but not as much  Relevant Historical Information: History of bladder cancer    Additional pertinent review of systems negative.  Current Outpatient Medications  Medication Sig Dispense Refill   Calcium Carbonate (CALCIUM 500 PO) Take 1,000 mg by mouth daily.     Cholecalciferol (D3 ADULT PO) Take 2,000 Units by mouth daily.     EPINEPHrine 0.3 mg/0.3 mL IJ SOAJ injection Use as directed for life-threatening allergic reaction. 2 each 3   LORazepam (ATIVAN) 0.5 MG tablet Take 1 tablet (0.5 mg total) by mouth every 8 (eight) hours as needed for anxiety. 30 tablet 0   losartan (COZAAR) 25 MG tablet Take 1 tablet (25 mg total) by mouth daily. 30 tablet 5   No current facility-administered medications for this visit.  Objective:     Vitals:   10/09/21 1118  BP: 124/80  Pulse: 71  SpO2: (!) 89%  Weight: 185 lb (83.9 kg)  Height: 5\' 2"  (1.575 m)      Body mass index is 33.84 kg/m.    Physical Exam:     Gen: Appears well, nad, nontoxic and pleasant Neuro:sensation intact, strength is 5/5 with df/pf/inv/ev, muscle tone wnl Skin: no suspicious lesion or defmority Psych: A&O, appropriate mood and affect  Left shoulder: no deformity, swelling or muscle wasting No scapular winging FF 180, abd 180, int 0, ext 90 TTP mildly thoracic paraspinal and rhomboid NTTP over the [place], clavicle, ac,  coracoid, biceps groove, humerus, deltoid, trapezius, cervical spine Negative Spurling's test bilat FROM of neck   Electronically signed by:  Benito Mccreedy D.[facility] 11:36 AM 10/09/21

## 2021-10-09 ENCOUNTER — Other Ambulatory Visit: Payer: Self-pay

## 2021-10-09 ENCOUNTER — Ambulatory Visit (INDEPENDENT_AMBULATORY_CARE_PROVIDER_SITE_OTHER): Payer: Medicare HMO | Admitting: Sports Medicine

## 2021-10-09 VITALS — BP 124/80 | HR 71 | Ht 62.0 in | Wt 185.0 lb

## 2021-10-09 DIAGNOSIS — G2589 Other specified extrapyramidal and movement disorders: Secondary | ICD-10-CM | POA: Diagnosis not present

## 2021-10-09 NOTE — Patient Instructions (Addendum)
Good to see you  Continue HEP  Continue NSAIDs, tylenol as needed for pain  Look up videos of Osteopathic Manipulation Treatment (OMT) on youtube Follow up 2/4 weeks

## 2021-10-10 DIAGNOSIS — C672 Malignant neoplasm of lateral wall of bladder: Secondary | ICD-10-CM | POA: Diagnosis not present

## 2021-10-18 ENCOUNTER — Encounter: Payer: Self-pay | Admitting: Adult Health

## 2021-10-18 ENCOUNTER — Ambulatory Visit: Payer: Medicare HMO | Admitting: Adult Health

## 2021-10-18 ENCOUNTER — Other Ambulatory Visit: Payer: Self-pay

## 2021-10-18 DIAGNOSIS — R918 Other nonspecific abnormal finding of lung field: Secondary | ICD-10-CM | POA: Diagnosis not present

## 2021-10-18 DIAGNOSIS — G4733 Obstructive sleep apnea (adult) (pediatric): Secondary | ICD-10-CM | POA: Diagnosis not present

## 2021-10-18 NOTE — Assessment & Plan Note (Signed)
Lung nodules noted on CT chest June 26 2021 with several scattered nodules and a focus of irregular 14 mm opacity in the right middle lobe.  Patient has a follow-up CT chest on October 20, 2021 we will follow the CT for stability.  If any growth is noted could consider PET scan.  And need for tissue sampling.  Plan  Patient Instructions  Restart CPAP when your machine arrives .  Continue on CPAP at bedtime Work on healthy weight loss Do not drive if sleepy  Will follow up on CT chest on 10/20/21 once results are available and decide on next step .   Follow up with Dr. Halford Chessman  in 3 months and As needed

## 2021-10-18 NOTE — Progress Notes (Signed)
@Patient  ID: Angel Williamson, female    DOB: Sep 30, 1956, 65 y.o.   MRN: [id number]  Chief Complaint  Patient presents with   Follow-up    Referring provider: Binnie Rail, MD  HPI: 65 year old female former smoker (15 py- quit 1982)  followed for obstructive sleep apnea on nocturnal CPAP Medical history significant for hypertension and prediabetes Medical history significant for bladder cancer- September 2022  COVID-19 infection in June 2022  TEST/EVENTS :  HST 06/04/16 >> AHI 46.8, SaO2 low 72%   11/04/2019-overnight oximetry-overnight oximetry on CPAP, room air-duration of sleep 6 hours and 9 minutes, time spent below 88% 0.8 minutes   10/18/2021 Follow up ; OSA and Lung nodule  Patient returns for a follow-up visit.  Patient has underlying severe obstructive sleep apnea is on nocturnal CPAP. Has not been able to wear it, new machine has been ordered in September.  Says something is wrong with machine . Wants to wait till new machine gets here   Diagnosis with Bladder cancer 06/2021 , s/p instillation . Following with [facility]  . Patient was recently had a renal CT.   Incidental finding noted 14 mm focal opacity in the right middle lobe.  Patient was set up for a dedicated CT chest on June 26, 2021 that showed a few calcified left hilar nodes consistent with old granulomatous disease, atelectasis versus scarring bilaterally, some segmental atelectasis in the right upper lobe.  Focus of irregular 14 mm opacity in the right middle lobe favoring atelectasis.  8 mm of solid right lower lobe density and a 7 mm right upper lobe density.  Calcified subpleural granuloma left lower lobe.  Patient was referred to our office by primary care to further evaluate these areas. Patient is a former smoker  She denies any cough, shortness of breath, unintentional weight loss, hemoptysis.  From [employer] , office work . Dog . Lives on [place] . No chickens or birds.  No hx of Pneumonia .  Covid  vaccine x 3 . No flu vaccine .   Allergies  Allergen Reactions   Hydrochlorothiazide     REACTION: low potassium   Amlodipine     Face flushing, body tingling   Losartan Potassium     Muscle Weakness   Shellfish Allergy Hives    All shellfish   Spironolactone Other (See Comments)    Muscle Weakness, Breast Swelling   Benazepril Hcl     REACTION: cough    Immunization History  Administered Date(s) Administered   PFIZER(Purple Top)SARS-COV-2 Vaccination 01/18/2020, 02/08/2020, 09/06/2020    Past Medical History:  Diagnosis Date   Bladder cancer [facility])    urologist-- dr Louis Meckel   Breast fibrocystic disorder    History of adenomatous polyp of colon    History of COVID-19 03/2021   per pt positive home test ,  mild symptoms that resolved   Hypertension    followed by pcp    (ETT in epic 07-09-2014 normal)   Lung nodule    followed by dr Halford Chessman   OSA on CPAP    followed by dr Halford Chessman--- study in epic 08-07-20107 severe osa   Osteopenia    Renal cyst, left    Varicose veins of both lower extremities     Tobacco History: Social History   Tobacco Use  Smoking Status Former   Packs/day: 1.00   Years: 15.00   Pack years: 15.00   Types: Cigarettes   Quit date: 1988   Years since quitting: 34.9  Smokeless Tobacco Never   Counseling given: Not Answered   Outpatient Medications Prior to Visit  Medication Sig Dispense Refill   Calcium Carbonate (CALCIUM 500 PO) Take 1,000 mg by mouth daily.     Cholecalciferol (D3 ADULT PO) Take 2,000 Units by mouth daily.     EPINEPHrine 0.3 mg/0.3 mL IJ SOAJ injection Use as directed for life-threatening allergic reaction. 2 each 3   LORazepam (ATIVAN) 0.5 MG tablet Take 1 tablet (0.5 mg total) by mouth every 8 (eight) hours as needed for anxiety. 30 tablet 0   losartan (COZAAR) 25 MG tablet Take 1 tablet (25 mg total) by mouth daily. 30 tablet 5   No facility-administered medications prior to visit.     Review of Systems:    Constitutional:   No  weight loss, night sweats,  Fevers, chills, fatigue, or  lassitude.  HEENT:   No headaches,  Difficulty swallowing,  Tooth/dental problems, or  Sore throat,                No sneezing, itching, ear ache, nasal congestion, post nasal drip,   CV:  No chest pain,  Orthopnea, PND, swelling in lower extremities, anasarca, dizziness, palpitations, syncope.   GI  No heartburn, indigestion, abdominal pain, nausea, vomiting, diarrhea, change in bowel habits, loss of appetite, bloody stools.   Resp: No shortness of breath with exertion or at rest.  No excess mucus, no productive cough,  No non-productive cough,  No coughing up of blood.  No change in color of mucus.  No wheezing.  No chest wall deformity  Skin: no rash or lesions.  GU: no dysuria, change in color of urine, no urgency or frequency.  No flank pain, no hematuria   MS:  No joint pain or swelling.  No decreased range of motion.  No back pain.    Physical Exam  BP (!) 154/88 (BP Location: Left Arm, Cuff Size: Normal)    Pulse 79    Temp 98.7 F (37.1 C) (Temporal)    Ht 5\' 3"  (1.6 m)    Wt 186 lb (84.4 kg)    SpO2 100%    BMI 32.95 kg/m   GEN: A/Ox3; pleasant , NAD, well nourished    HEENT:  [place]/AT,  EACs-clear, TMs-wnl, NOSE-clear, THROAT-clear, no lesions, no postnasal drip or exudate noted.   NECK:  Supple w/ fair ROM; no JVD; normal carotid impulses w/o bruits; no thyromegaly or nodules palpated; no lymphadenopathy.    RESP  Clear  P & A; w/o, wheezes/ rales/ or rhonchi. no accessory muscle use, no dullness to percussion  CARD:  RRR, no m/r/g, no peripheral edema, pulses intact, no cyanosis or clubbing.  GI:   Soft & nt; nml bowel sounds; no organomegaly or masses detected.   Musco: Warm bil, no deformities or joint swelling noted.   Neuro: alert, no focal deficits noted.    Skin: Warm, no lesions or rashes    Lab Results:  CBC    Component Value Date/Time   WBC 7.6 01/11/2021 1404    RBC 4.11 01/11/2021 1404   HGB 12.6 07/06/2021 0619   HCT 37.0 07/06/2021 0619   PLT 285.0 01/11/2021 1404   MCV 90.8 01/11/2021 1404   MCHC 33.7 01/11/2021 1404   RDW 13.0 01/11/2021 1404   LYMPHSABS 2.1 01/11/2021 1404   MONOABS 0.5 01/11/2021 1404   EOSABS 0.2 01/11/2021 1404   BASOSABS 0.0 01/11/2021 1404    BMET    Component Value Date/Time  NA 142 07/06/2021 0619   K 4.1 07/06/2021 0619   CL 104 07/06/2021 0619   CO2 30 01/11/2021 1404   GLUCOSE 105 (H) 07/06/2021 0619   BUN 19 07/06/2021 0619   CREATININE 0.80 07/06/2021 0619   CALCIUM 9.8 01/11/2021 1404   GFRNONAA 69.37 12/21/2009 1057   GFRAA 75 11/24/2007 1045    BNP No results found for: BNP  ProBNP No results found for: PROBNP  Imaging: No results found.    No flowsheet data found.  No results found for: NITRICOXIDE      Assessment & Plan:   OSA (obstructive sleep apnea), Severe Severe obstructive sleep apnea-patient has not been able to use her CPAP is waiting for new CPAP.  Patient is advised to begin CPAP as soon as she gets this.  Patient education on sleep apnea given. We will check with DME on status of her CPAP  Plan  Patient Instructions  Restart CPAP when your machine arrives .  Continue on CPAP at bedtime Work on healthy weight loss Do not drive if sleepy  Will follow up on CT chest on 10/20/21 once results are available and decide on next step .   Follow up with Dr. Halford Chessman  in 3 months and As needed        Lung nodules Lung nodules noted on CT chest June 26 2021 with several scattered nodules and a focus of irregular 14 mm opacity in the right middle lobe.  Patient has a follow-up CT chest on October 20, 2021 we will follow the CT for stability.  If any growth is noted could consider PET scan.  And need for tissue sampling.  Plan  Patient Instructions  Restart CPAP when your machine arrives .  Continue on CPAP at bedtime Work on healthy weight loss Do not drive if  sleepy  Will follow up on CT chest on 10/20/21 once results are available and decide on next step .   Follow up with Dr. Halford Chessman  in 3 months and As needed          Rexene Edison, NP 10/18/2021

## 2021-10-18 NOTE — Assessment & Plan Note (Signed)
Severe obstructive sleep apnea-patient has not been able to use her CPAP is waiting for new CPAP.  Patient is advised to begin CPAP as soon as she gets this.  Patient education on sleep apnea given. We will check with DME on status of her CPAP  Plan  Patient Instructions  Restart CPAP when your machine arrives .  Continue on CPAP at bedtime Work on healthy weight loss Do not drive if sleepy  Will follow up on CT chest on 10/20/21 once results are available and decide on next step .   Follow up with Dr. Halford Chessman  in 3 months and As needed

## 2021-10-18 NOTE — Patient Instructions (Signed)
Restart CPAP when your machine arrives .  Continue on CPAP at bedtime Work on healthy weight loss Do not drive if sleepy  Will follow up on CT chest on 10/20/21 once results are available and decide on next step .   Follow up with Dr. Halford Chessman  in 3 months and As needed

## 2021-10-18 NOTE — Progress Notes (Signed)
Reviewed and agree with assessment/plan.   Chesley Mires, MD [facility] Pulmonary/Critical Care 10/18/2021, 1:07 PM Pager:  [phone]

## 2021-10-20 ENCOUNTER — Ambulatory Visit (INDEPENDENT_AMBULATORY_CARE_PROVIDER_SITE_OTHER)
Admission: RE | Admit: 2021-10-20 | Discharge: 2021-10-20 | Disposition: A | Discharge status: Home / Self Care | Payer: Medicare HMO | Source: Ambulatory Visit | Attending: Internal Medicine | Admitting: Internal Medicine

## 2021-10-20 ENCOUNTER — Other Ambulatory Visit: Payer: Self-pay

## 2021-10-20 DIAGNOSIS — R911 Solitary pulmonary nodule: Secondary | ICD-10-CM | POA: Diagnosis not present

## 2021-10-20 DIAGNOSIS — IMO0001 Reserved for inherently not codable concepts without codable children: Secondary | ICD-10-CM

## 2021-10-26 ENCOUNTER — Encounter: Payer: Self-pay | Admitting: Internal Medicine

## 2021-10-26 DIAGNOSIS — K8020 Calculus of gallbladder without cholecystitis without obstruction: Secondary | ICD-10-CM | POA: Insufficient documentation

## 2021-10-31 ENCOUNTER — Ambulatory Visit: Payer: Medicare HMO | Admitting: Sports Medicine

## 2021-10-31 ENCOUNTER — Other Ambulatory Visit: Payer: Self-pay

## 2021-10-31 VITALS — BP 120/70 | HR 59 | Ht 63.0 in | Wt 184.0 lb

## 2021-10-31 DIAGNOSIS — G2589 Other specified extrapyramidal and movement disorders: Secondary | ICD-10-CM

## 2021-10-31 NOTE — Progress Notes (Signed)
Angel Williamson D.[place] Phone: [phone]   Assessment and Plan:    1. Scapular dyskinesis -Chronic with exacerbation, subsequent visit - Significant improvement in symptoms and scapular coordination with HEP and Tylenol/NSAIDs as needed - Continue Tylenol/NSAIDs as needed - Continue HEP   Pertinent previous records reviewed include none   Follow Up: As needed if no improvement or worsening of symptoms   Subjective:   I, Angel Williamson, am serving as a Education administrator for [employer]  Chief Complaint: left sided back pain   HPI:  08/07/21 Patient is a 66 year old female presenting with left sided mid back pain. Patient was seen by her PCP 07/31/2021 and stated constant back pain going on for 2 months. Patient states the pain is only with pressure (wearing a bra)and leaning, describes pain as a burning pain. Patient had an Xray of her ribs on 10/3 and given Methocarbamol and instructed to take tylenol as needed for pain. Patient has also tried Ice and heat to help with pain. Patient states today that the muscle relaxer is helping but she still is having the pan, but not as bad as it was. Patient states that the pain is very aggravating, patient was diagnoses with bladder cancer a while ago so she was worried, but she was reassured it was not because of that.   09/04/21 Patient states that she has been doing better, but then went to PT and the position she was in made it painful. Since then has been better. Laying or sitting back on something hard is what makes the pain worse. Patient states the meloxicam and muscle relaxer was helpful, but has not had to use it much as of lately. States she can get by on tylenol.    09/07/21 Today patient states upper back is doing better. Lower back pain started 2 days ago. Constant ache, btu sharp when making certain movements. More on right than left.  Denies urinary symptoms,  frequency, discharge, fever, chills.   09/25/21 Today patient states the lower back pain is all better. But is still having her uppser back pain. Patent states the upper back pain is not as bad as it was at her first visit but it is still bothering her.    10/09/2021  Patient states that she is doing better, still having upper back pain but not as much  10/31/2021 Patient states that she is doing good upper back pain is not as bad as it has been    Relevant Historical Information: History of bladder cancer    Additional pertinent review of systems negative.  Current Outpatient Medications  Medication Sig Dispense Refill   Calcium Carbonate (CALCIUM 500 PO) Take 1,000 mg by mouth daily.     Cholecalciferol (D3 ADULT PO) Take 2,000 Units by mouth daily.     EPINEPHrine 0.3 mg/0.3 mL IJ SOAJ injection Use as directed for life-threatening allergic reaction. 2 each 3   LORazepam (ATIVAN) 0.5 MG tablet Take 1 tablet (0.5 mg total) by mouth every 8 (eight) hours as needed for anxiety. 30 tablet 0   losartan (COZAAR) 25 MG tablet Take 1 tablet (25 mg total) by mouth daily. 30 tablet 5   No current facility-administered medications for this visit.      Objective:     Vitals:   10/31/21 1126  BP: 120/70  Pulse: (!) 59  SpO2: 95%  Weight: 184 lb (83.5 kg)  Height: 5'  3" (1.6 m)      Body mass index is 32.59 kg/m.    Physical Exam:     Gen: Appears well, nad, nontoxic and pleasant Neuro:sensation intact, strength is 5/5 with df/pf/inv/ev, muscle tone wnl Skin: no suspicious lesion or defmority Psych: A&O, appropriate mood and affect   Left shoulder: no deformity, swelling or muscle wasting No scapular winging FF 180, abd 180, int 0, ext 90 NTTP mildly thoracic paraspinal and rhomboid NTTP over the [place], clavicle, ac, coracoid, biceps groove, humerus, deltoid, trapezius, cervical spine Negative Spurling's test bilat FROM of neck   Electronically signed by:  Angel Williamson D.[facility] 11:39 AM 10/31/21

## 2021-10-31 NOTE — Patient Instructions (Addendum)
Good to see you  ?As needed follow up  ?

## 2021-11-09 DIAGNOSIS — D494 Neoplasm of unspecified behavior of bladder: Secondary | ICD-10-CM | POA: Diagnosis not present

## 2021-11-09 DIAGNOSIS — C672 Malignant neoplasm of lateral wall of bladder: Secondary | ICD-10-CM | POA: Diagnosis not present

## 2021-11-24 ENCOUNTER — Other Ambulatory Visit: Payer: Self-pay | Admitting: *Deleted

## 2021-11-24 DIAGNOSIS — R918 Other nonspecific abnormal finding of lung field: Secondary | ICD-10-CM

## 2021-11-28 NOTE — Progress Notes (Signed)
Subjective:    Patient ID: Angel Williamson, female    DOB: 11/22/1955, 66 y.o.   MRN: [id number]  This visit occurred during the SARS-CoV-2 public health emergency.  Safety protocols were in place, including screening questions prior to the visit, additional usage of staff PPE, and extensive cleaning of exam room while observing appropriate contact time as indicated for disinfecting solutions.    HPI The patient is here for an acute visit.   Left sided mid back pain - it started a couple of weeks ago and is intermittent.  It is slightly better than when it first started.  It is worse with sitting straight up or moving in certain positions such as bending over or twisting.  The pain is better laying down or sitting still.  She has been taking Tylenol and that does help some.  She is unsure if it was related to something inside or her scapular dyskinesia that she has been dealing with.  The left side was worse than the right side and she is still doing exercises for that which do help.  She did see urology recently and she discussed with him and he said it was not her kidney.  Urine testing at that time was normal.  She denies any GU or GI symptoms.    Medications and allergies reviewed with patient and updated if appropriate.  Patient Active Problem List   Diagnosis Date Noted   Cholelithiasis 10/26/2021   Lung nodules 10/18/2021   Bladder cancer ([place]) 07/01/2021   Aortic atherosclerosis ([place]) 06/28/2021   Benign cyst of left kidney 06/17/2021   Urinary frequency 06/02/2021   Gross hematuria 06/02/2021   Left flank pain 06/02/2021   Paresthesia 02/06/2021   Contact dermatitis 02/06/2021   Allergic dermatitis 01/30/2021   Vitamin D deficiency 01/10/2021   Varicose veins of both lower extremities 12/06/2020   Mid back pain on left side 06/27/2020   Dizziness 10/05/2019   Constipation 10/10/2018   Right-sided low back pain without sciatica 09/23/2018   Rectal discharge  09/23/2018   Anxiety 07/22/2017   OSA (obstructive sleep apnea), Severe 08/22/2016   Prediabetes 01/11/2016   Obesity 01/11/2016   Skin cancer, basal cell 04/18/2012   COLONIC POLYPS, HX OF 12/13/2009   Osteopenia 03/29/2009   Essential hypertension 11/14/2007    Current Outpatient Medications on File Prior to Visit  Medication Sig Dispense Refill   Calcium Carbonate (CALCIUM 500 PO) Take 1,000 mg by mouth daily.     Cholecalciferol (D3 ADULT PO) Take 2,000 Units by mouth daily.     EPINEPHrine 0.3 mg/0.3 mL IJ SOAJ injection Use as directed for life-threatening allergic reaction. 2 each 3   LORazepam (ATIVAN) 0.5 MG tablet Take 1 tablet (0.5 mg total) by mouth every 8 (eight) hours as needed for anxiety. 30 tablet 0   losartan (COZAAR) 25 MG tablet Take 1 tablet (25 mg total) by mouth daily. 30 tablet 5   No current facility-administered medications on file prior to visit.    Past Medical History:  Diagnosis Date   Bladder cancer [facility])    urologist-- dr Louis Meckel   Breast fibrocystic disorder    History of adenomatous polyp of colon    History of COVID-19 03/2021   per pt positive home test ,  mild symptoms that resolved   Hypertension    followed by pcp    (ETT in epic 07-09-2014 normal)   Lung nodule    followed by dr Halford Chessman  OSA on CPAP    followed by dr Halford Chessman--- study in epic 08-07-20107 severe osa   Osteopenia    Renal cyst, left    Varicose veins of both lower extremities     Past Surgical History:  Procedure Laterality Date   COLONOSCOPY  2013   NO PAST SURGERIES     TRANSURETHRAL RESECTION OF BLADDER TUMOR WITH MITOMYCIN-C Bilateral 07/06/2021   Procedure: TRANSURETHRAL RESECTION OF BLADDER TUMOR BILATERAL RETROGRADE PYELOGRAM WITH GEMCITABINE INSTILLATION IN PACU;  Surgeon: Ardis Hughs, MD;  Location: [facility];  Service: Urology;  Laterality: Bilateral;    Social History   Socioeconomic History   Marital status: Married    Spouse  name: Not on file   Number of children: 2   Years of education: Not on file   Highest education level: Associate degree: academic program  Occupational History   Occupation: office assistant    Comment: [employer]  Tobacco Use   Smoking status: Former    Packs/day: 1.00    Years: 15.00    Pack years: 15.00    Types: Cigarettes    Quit date: 1988    Years since quitting: 35.1   Smokeless tobacco: Never  Vaping Use   Vaping Use: Never used  Substance and Sexual Activity   Alcohol use: Never   Drug use: Never   Sexual activity: Not on file  Other Topics Concern   Not on file  Social History Narrative   Lives with husband   Caffeine none since 2015   Social Determinants of Health   Financial Resource Strain: Not on file  Food Insecurity: Not on file  Transportation Needs: Not on file  Physical Activity: Not on file  Stress: Not on file  Social Connections: Not on file    Family History  Problem Relation Age of Onset   Anemia Mother    Colon polyps Father    Diabetes Maternal Grandfather    Cancer Paternal Grandmother        stomach   Stomach cancer Paternal Grandmother    Stroke Paternal Grandfather 70   Asthma Son    Anemia Other        Cousin    Colon cancer Neg Hx    Esophageal cancer Neg Hx    Rectal cancer Neg Hx     Review of Systems  Constitutional:  Negative for chills and fever.  Gastrointestinal:  Negative for abdominal pain, blood in stool, constipation, diarrhea and nausea.  Genitourinary:  Negative for difficulty urinating, dysuria, frequency, hematuria and urgency.      Objective:   Vitals:   11/29/21 1026  BP: 136/84  Pulse: 68  Temp: 98.6 F (37 C)  SpO2: 99%   BP Readings from Last 3 Encounters:  11/29/21 136/84  10/31/21 120/70  10/18/21 (!) 154/88   Wt Readings from Last 3 Encounters:  11/29/21 186 lb (84.4 kg)  10/31/21 184 lb (83.5 kg)  10/18/21 186 lb (84.4 kg)   Body mass index is 32.95 kg/m.   Physical  Exam Constitutional:      General: She is not in acute distress.    Appearance: Normal appearance. She is not ill-appearing.  Cardiovascular:     Rate and Rhythm: Normal rate and regular rhythm.  Pulmonary:     Effort: Pulmonary effort is normal. No respiratory distress.     Breath sounds: No wheezing or rales.  Musculoskeletal:        General: No tenderness (No significant  left-sided back pain with palpation.  No thoracic spine tenderness.  Some pain in the knee left mid back with certain movements.).     Right lower leg: No edema.     Left lower leg: No edema.  Skin:    General: Skin is warm and dry.     Findings: No bruising, erythema or rash.  Neurological:     Mental Status: She is alert.           Assessment & Plan:    See Problem List for Assessment and Plan of chronic medical problems.

## 2021-11-29 ENCOUNTER — Ambulatory Visit (INDEPENDENT_AMBULATORY_CARE_PROVIDER_SITE_OTHER): Payer: Medicare HMO | Admitting: Internal Medicine

## 2021-11-29 ENCOUNTER — Other Ambulatory Visit: Payer: Self-pay

## 2021-11-29 ENCOUNTER — Encounter: Payer: Self-pay | Admitting: Internal Medicine

## 2021-11-29 VITALS — BP 136/84 | HR 68 | Temp 98.6°F | Ht 63.0 in | Wt 186.0 lb

## 2021-11-29 DIAGNOSIS — M549 Dorsalgia, unspecified: Secondary | ICD-10-CM | POA: Diagnosis not present

## 2021-11-29 NOTE — Assessment & Plan Note (Signed)
Acute Going on for 2 weeks-slowly getting better Pain is likely musculoskeletal in nature-worse with certain movements and positions, no GU or GI symptoms Pain improved with Tylenol Can continue Tylenol, but encouraged her to take small dose of ibuprofen for several days which will likely help more Can try heat and topical pain medications If no improvement can see sports medicine, but most likely this will improve with more time May be related to scapular dyskinesia-continue exercises

## 2021-11-29 NOTE — Patient Instructions (Addendum)
° ° °  Your pain is likely muscular.   Try taking advil for a few days to see if that helps.  Apply heat.

## 2022-01-02 DIAGNOSIS — M542 Cervicalgia: Secondary | ICD-10-CM | POA: Diagnosis not present

## 2022-01-02 DIAGNOSIS — M546 Pain in thoracic spine: Secondary | ICD-10-CM | POA: Diagnosis not present

## 2022-01-09 DIAGNOSIS — M546 Pain in thoracic spine: Secondary | ICD-10-CM | POA: Diagnosis not present

## 2022-01-16 ENCOUNTER — Other Ambulatory Visit: Payer: Self-pay | Admitting: Internal Medicine

## 2022-01-16 ENCOUNTER — Encounter: Payer: Self-pay | Admitting: Pulmonary Disease

## 2022-01-16 ENCOUNTER — Ambulatory Visit: Payer: Medicare HMO | Admitting: Pulmonary Disease

## 2022-01-16 ENCOUNTER — Other Ambulatory Visit: Payer: Self-pay

## 2022-01-16 VITALS — BP 130/74 | HR 71 | Temp 98.4°F | Ht 63.0 in | Wt 187.4 lb

## 2022-01-16 DIAGNOSIS — G4733 Obstructive sleep apnea (adult) (pediatric): Secondary | ICD-10-CM | POA: Diagnosis not present

## 2022-01-16 DIAGNOSIS — J310 Chronic rhinitis: Secondary | ICD-10-CM

## 2022-01-16 DIAGNOSIS — R918 Other nonspecific abnormal finding of lung field: Secondary | ICD-10-CM | POA: Diagnosis not present

## 2022-01-16 DIAGNOSIS — M546 Pain in thoracic spine: Secondary | ICD-10-CM | POA: Diagnosis not present

## 2022-01-16 MED ORDER — FLUTICASONE PROPIONATE 50 MCG/ACT NA SUSP: 1.0000 | Freq: Every evening | NASAL | 2 refills | Status: DC

## 2022-01-16 NOTE — Progress Notes (Signed)
? ?[facility] ? ?Chief Complaint  ?Patient presents with  ? Follow-up  ?  Wearing CPAP-has  a new machine, doing good  ? ? ?Past Surgical History:  ?She  has a past surgical history that includes Colonoscopy (2013); No past surgeries; and Transurethral resection of bladder tumor with mitomycin-c (Bilateral, 07/06/2021). ? ?Past Medical History:  ?HTN, Bladder cancer, Colon polyp, COVID 16 April 2021, Osteopenia, Varicose veins ? ?Constitutional:  ?BP 130/74 (BP Location: Right Arm, Cuff Size: Normal)   Pulse 71   Temp 98.4 ?F (36.9 ?C) (Temporal)   Ht '5\' 3"'$  (1.6 m)   Wt 187 lb 6.4 oz (85 kg)   SpO2 99%   BMI 33.20 kg/m?  ? ?Brief Summary:  ?Angel Williamson is a 66 y.o. female former smoker with obstructive sleep apnea and lung nodules. ?  ? ? ? ?Subjective:  ? ?She was having trouble with paresthesias.  She isn't sure what caused this, but improved.  She saw orthopedics for back pain.  She is to start gabapentin. ? ?She got her new CPAP.  Machine working better.  She is still trying to adjust to using CPAP again, but feels it helps.  She has nasal cushion mask. ? ?She has been getting sinus congestion and hears her heart beating when using new CPAP machine. ? ?Download shows average AHI 1.7 with median CPAP 9 cm H2O. ? ?Physical Exam:  ? ?Appearance - well kempt  ? ?ENMT - no sinus tenderness, no oral exudate, no LAN, Mallampati 3 airway, no stridor ? ?Respiratory - equal breath sounds bilaterally, no wheezing or rales ? ?CV - s1s2 regular rate and rhythm, no murmurs ? ?Ext - no clubbing, no edema ? ?Skin - no rashes ? ?Psych - normal mood and affect ?  ?Chest Imaging:  ?CT chest 10/21/21 >> calcified granuloma LLL, 8 mm RLL GGO stable ? ?Sleep Tests:  ?HST 06/04/16 >> AHI 46.8, SaO2 low 72% ?Auto CPAP 08/21/17 to 09/19/17 >> used on 30 of 30 nights with average 5 hrs 40 min.  Average AHI 0.4 with median CPAP 7 and 95 th percentile CPAP 10 cm H2O ? ?Social History:  ?She   reports that she quit smoking about 35 years ago. Her smoking use included cigarettes. She has a 15.00 pack-year smoking history. She has never used smokeless tobacco. She reports that she does not drink alcohol and does not use drugs. ? ?Family History:  ?Her family history includes Anemia in her mother and another family member; Asthma in her son; Cancer in her paternal grandmother; Colon polyps in her father; Diabetes in her maternal grandfather; Stomach cancer in her paternal grandmother; Stroke (age of onset: 47) in her paternal grandfather. ?  ? ? ?Assessment/Plan:  ? ?Obstructive sleep apnea. ?- she is compliant with CPAP and reports benefit from therapy ?- she uses [employer] for her DME ?- continue auto CPAP 5 to 12 cm H2O ?- discussed techniques to help adjust to using CPAP again ? ?CPAP rhinitis. ?- will have her use nasal irrigation and flonase at night ? ?Lung nodule with history of tobacco abuse and history of bladder cancer. ?- will need follow up CT chest without contrast in December 2023 ? ?Time Spent Involved in Patient Care on Day of Examination:  ?35 minutes ? ?Follow up:  ? ?Patient Instructions  ?Try using saline nasal rinse and flonase at night ? ?Follow up in 10 months ? ?Medication List:  ? ?Allergies as  of 01/16/2022   ? ?   Reactions  ? Hydrochlorothiazide   ? REACTION: low potassium  ? Amlodipine   ? Face flushing, body tingling  ? Losartan Potassium   ? Muscle Weakness  ? Shellfish Allergy Hives  ? All shellfish  ? Spironolactone Other (See Comments)  ? Muscle Weakness, Breast Swelling  ? Benazepril Hcl   ? REACTION: cough  ? ?  ? ?  ?Medication List  ?  ? ?  ? Accurate as of January 16, 2022 12:05 PM. If you have any questions, ask your nurse or doctor.  ?  ?  ? ?  ? ?CALCIUM 500 PO ?Take 1,000 mg by mouth daily. ?  ?D3 ADULT PO ?Take 2,000 Units by mouth daily. ?  ?EPINEPHrine 0.3 mg/0.3 mL Soaj injection ?Commonly known as: EPI-PEN ?Use as directed for life-threatening allergic reaction. ?   ?fluticasone 50 MCG/ACT nasal spray ?Commonly known as: FLONASE ?Place 1 spray into both nostrils at bedtime. ?Started by: Chesley Mires, MD ?  ?LORazepam 0.5 MG tablet ?Commonly known as: ATIVAN ?Take 1 tablet (0.5 mg total) by mouth every 8 (eight) hours as needed for anxiety. ?  ?losartan 25 MG tablet ?Commonly known as: COZAAR ?Take 1 tablet (25 mg total) by mouth daily. ?  ? ?  ? ? ?Signature:  ?Chesley Mires, MD ?[place] ?Pager - [phone] - [phone] ?01/16/2022, 12:05 PM ?  ? ? ? ? ? ? ? ? ?

## 2022-01-16 NOTE — Patient Instructions (Signed)
Try using saline nasal rinse and flonase at night ? ?Follow up in 10 months ?

## 2022-01-24 ENCOUNTER — Ambulatory Visit: Payer: Medicare HMO | Admitting: Internal Medicine

## 2022-02-04 ENCOUNTER — Encounter: Payer: Self-pay | Admitting: Internal Medicine

## 2022-02-04 NOTE — Progress Notes (Signed)
? ? ?Subjective:  ? ? Patient ID: Angel Williamson, female    DOB: 07-Jun-1956, 66 y.o.   MRN: [id number] ? ? ?This visit occurred during the SARS-CoV-2 public health emergency.  Safety protocols were in place, including screening questions prior to the visit, additional usage of staff PPE, and extensive cleaning of exam room while observing appropriate contact time as indicated for disinfecting solutions. ? ? ? ?HPI ?Angel Williamson is here for  ?Chief Complaint  ?Patient presents with  ? Annual Exam  ? Back Pain  ?  Left side back pain, OTC tylenol   ? Dizziness  ?  Feeling dizziness in the am, Not every day   ? ? ?Cystoscopy in Jan '23- lesion cauterized.  Cystoscopy this month.   ? ? ?Aortic atherosclerosis - ct 8/22.   ? ? ?Ct chest due 09/2022 - chest ? ? ?Still has pain in left flank - not as bad as it was - some days she does not feel it.  Tylenol helps.  It started near her shoulder blade. Worse with sitting.  Does not feel it with standing or laying down.  Mild pain.  No pain with pushing on that area.   Occasionally comes around to the front.   ? ?Some days gets hot, lightheaded and anxious and feels like she may pass out - usually in the late morning, not daily.   ? ? ? ?Medications and allergies reviewed with patient and updated if appropriate. ? ? ?Current Outpatient Medications on File Prior to Visit  ?Medication Sig Dispense Refill  ? Calcium Carbonate (CALCIUM 500 PO) Take 1,000 mg by mouth daily.    ? Cholecalciferol (D3 ADULT PO) Take 2,000 Units by mouth daily.    ? EPINEPHrine 0.3 mg/0.3 mL IJ SOAJ injection Use as directed for life-threatening allergic reaction. 2 each 3  ? fluticasone (FLONASE) 50 MCG/ACT nasal spray Place 1 spray into both nostrils at bedtime. 16 g 2  ? LORazepam (ATIVAN) 0.5 MG tablet Take 1 tablet (0.5 mg total) by mouth every 8 (eight) hours as needed for anxiety. 30 tablet 0  ? losartan (COZAAR) 25 MG tablet TAKE 1 TABLET (25 MG TOTAL) BY MOUTH DAILY. 90 tablet 1  ? ?No current  facility-administered medications on file prior to visit.  ? ? ?Review of Systems  ?Constitutional:  Negative for fever.  ?Eyes:  Negative for visual disturbance.  ?Respiratory:  Negative for cough, shortness of breath and wheezing.   ?Cardiovascular:  Negative for chest pain, palpitations and leg swelling.  ?Gastrointestinal:  Negative for abdominal pain, blood in stool, constipation, diarrhea and nausea.  ?Genitourinary:  Negative for dysuria.  ?Musculoskeletal:  Positive for back pain (left mid back). Negative for arthralgias.  ?Skin:  Negative for rash.  ?Neurological:  Positive for dizziness (occ - when she gets too hot - then gets anxious). Negative for light-headedness and headaches.  ?Psychiatric/Behavioral:  Negative for dysphoric mood. The patient is nervous/anxious.   ? ?   ?Objective:  ? ?Vitals:  ? 02/05/22 1021  ?BP: (!) 142/76  ?Pulse: 75  ?Temp: 98.2 ?F (36.8 ?C)  ?SpO2: 99%  ? ?Filed Weights  ? 02/05/22 1021  ?Weight: 187 lb 8 oz (85 kg)  ? ?Body mass index is 33.21 kg/m?. ? ?BP Readings from Last 3 Encounters:  ?02/05/22 (!) 142/76  ?01/16/22 130/74  ?11/29/21 136/84  ? ? ?Wt Readings from Last 3 Encounters:  ?02/05/22 187 lb 8 oz (85 kg)  ?01/16/22 187 lb 6.4 oz (  85 kg)  ?11/29/21 186 lb (84.4 kg)  ? ? ? ?  02/05/2022  ? 10:25 AM 12/06/2020  ? 11:00 AM 10/15/2019  ?  2:37 PM 07/28/2018  ?  8:59 AM 07/28/2018  ?  8:58 AM  ?Depression screen PHQ 2/9  ?Decreased Interest 1 0 0 0 0  ?Down, Depressed, Hopeless 0 1 0 0 0  ?PHQ - 2 Score 1 1 0 0 0  ?Altered sleeping  0  0   ?Tired, decreased energy  0  0   ?Change in appetite  0  0   ?Feeling bad or failure about yourself   0  0   ?Trouble concentrating  0  0   ?Moving slowly or fidgety/restless  0  0   ?Suicidal thoughts  0  0   ?PHQ-9 Score  1  0   ?Difficult doing work/chores  Not difficult at all     ? ? ? ?   ? View : No data to display.  ?  ?  ?  ? ? ? ? ?  ?Physical Exam ?Constitutional: She appears well-developed and well-nourished. No distress.   ?HENT:  ?Head: Normocephalic and atraumatic.  ?Right Ear: External ear normal. Normal ear canal and TM ?Left Ear: External ear normal.  Normal ear canal and TM ?Mouth/Throat: Oropharynx is clear and moist.  ?Eyes: Conjunctivae and EOM are normal.  ?Neck: Neck supple. No tracheal deviation present. No thyromegaly present.  ?No carotid bruit  ?Cardiovascular: Normal rate, regular rhythm and normal heart sounds.   ?No murmur heard.  No edema. ?Pulmonary/Chest: Effort normal and breath sounds normal. No respiratory distress. She has no wheezes. She has no rales.  ?Breast: deferred   ?Abdominal: Soft. She exhibits no distension. There is no tenderness.  ?Lymphadenopathy: She has no cervical adenopathy.  ?Skin: Skin is warm and dry. She is not diaphoretic.  ?Psychiatric: She has a normal mood and affect. Her behavior is normal.  ? ? ? ?Lab Results  ?Component Value Date  ? WBC 7.6 01/11/2021  ? HGB 12.6 07/06/2021  ? HCT 37.0 07/06/2021  ? PLT 285.0 01/11/2021  ? GLUCOSE 105 (H) 07/06/2021  ? CHOL 131 01/11/2021  ? TRIG 122.0 01/11/2021  ? HDL 44.60 01/11/2021  ? [place] 62 01/11/2021  ? ALT 20 01/11/2021  ? AST 18 01/11/2021  ? NA 142 07/06/2021  ? K 4.1 07/06/2021  ? CL 104 07/06/2021  ? CREATININE 0.80 07/06/2021  ? BUN 19 07/06/2021  ? CO2 30 01/11/2021  ? TSH 1.24 01/11/2021  ? HGBA1C 6.1 01/11/2021  ? ? ? ? ?   ?Assessment & Plan:  ? ?Physical exam: ?Screening blood work  ordered ?Exercise  walking, some bands ?Weight  recommend weight loss ?Substance abuse  none ? ? ??  Episodes of feeling hot, lightheaded and anxious may be hypoglycemia-advised to try eating between breakfast and lunch since these episodes tend to happen in the morning ? ?Reviewed recommended immunizations. ? ? ?Health Maintenance  ?Topic Date Due  ? COVID-19 Vaccine (4 - Booster for Pfizer series) 02/21/2022 (Originally 11/01/2020)  ? Zoster Vaccines- Shingrix (1 of 2) 05/07/2022 (Originally 01/26/1975)  ? Pneumonia Vaccine 42+ Years old (1 - PCV)  02/06/2023 (Originally 01/25/2021)  ? TETANUS/TDAP  02/06/2023 (Originally 01/26/1975)  ? INFLUENZA VACCINE  05/29/2022  ? MAMMOGRAM  12/28/2022  ? COLONOSCOPY (Pts 45-77yr Insurance coverage will need to be confirmed)  05/09/2023  ? DEXA SCAN  01/27/2024  ? Hepatitis C Screening  Completed  ? HPV VACCINES  Aged Out  ?  ? ? ? ? ? ? ?See Problem List for Assessment and Plan of chronic medical problems. ? ? ? ? ?

## 2022-02-04 NOTE — Patient Instructions (Addendum)
? ? ? ?Blood work was ordered.   ? ? ?Medications changes include :   none ? ? ?Your prescription(s) have been sent to your pharmacy.  ? ? ? ?Return in about 6 months (around 08/07/2022) for follow up. ? ? ? ?Health Maintenance, Female ?Adopting a healthy lifestyle and getting preventive care are important in promoting health and wellness. Ask your health care provider about: ?The right schedule for you to have regular tests and exams. ?Things you can do on your own to prevent diseases and keep yourself healthy. ?What should I know about diet, weight, and exercise? ?Eat a healthy diet ? ?Eat a diet that includes plenty of vegetables, fruits, low-fat dairy products, and lean protein. ?Do not eat a lot of foods that are high in solid fats, added sugars, or sodium. ?Maintain a healthy weight ?Body mass index (BMI) is used to identify weight problems. It estimates body fat based on height and weight. Your health care provider can help determine your BMI and help you achieve or maintain a healthy weight. ?Get regular exercise ?Get regular exercise. This is one of the most important things you can do for your health. Most adults should: ?Exercise for at least 150 minutes each week. The exercise should increase your heart rate and make you sweat (moderate-intensity exercise). ?Do strengthening exercises at least twice a week. This is in addition to the moderate-intensity exercise. ?Spend less time sitting. Even light physical activity can be beneficial. ?Watch cholesterol and blood lipids ?Have your blood tested for lipids and cholesterol at 66 years of age, then have this test every 5 years. ?Have your cholesterol levels checked more often if: ?Your lipid or cholesterol levels are high. ?You are older than 66 years of age. ?You are at high risk for heart disease. ?What should I know about cancer screening? ?Depending on your health history and family history, you may need to have cancer screening at various ages. This  may include screening for: ?Breast cancer. ?Cervical cancer. ?Colorectal cancer. ?Skin cancer. ?Lung cancer. ?What should I know about heart disease, diabetes, and high blood pressure? ?Blood pressure and heart disease ?High blood pressure causes heart disease and increases the risk of stroke. This is more likely to develop in people who have high blood pressure readings or are overweight. ?Have your blood pressure checked: ?Every 3-5 years if you are 45-60 years of age. ?Every year if you are 64 years old or older. ?Diabetes ?Have regular diabetes screenings. This checks your fasting blood sugar level. Have the screening done: ?Once every three years after age 7 if you are at a normal weight and have a low risk for diabetes. ?More often and at a younger age if you are overweight or have a high risk for diabetes. ?What should I know about preventing infection? ?Hepatitis B ?If you have a higher risk for hepatitis B, you should be screened for this virus. Talk with your health care provider to find out if you are at risk for hepatitis B infection. ?Hepatitis C ?Testing is recommended for: ?Everyone born from [birth year] through 1965. ?Anyone with known risk factors for hepatitis C. ?Sexually transmitted infections (STIs) ?Get screened for STIs, including gonorrhea and chlamydia, if: ?You are sexually active and are younger than 66 years of age. ?You are older than 66 years of age and your health care provider tells you that you are at risk for this type of infection. ?Your sexual activity has changed since you were last screened, and you  are at increased risk for chlamydia or gonorrhea. Ask your health care provider if you are at risk. ?Ask your health care provider about whether you are at high risk for HIV. Your health care provider may recommend a prescription medicine to help prevent HIV infection. If you choose to take medicine to prevent HIV, you should first get tested for HIV. You should then be tested every 3  months for as long as you are taking the medicine. ?Pregnancy ?If you are about to stop having your period (premenopausal) and you may become pregnant, seek counseling before you get pregnant. ?Take 400 to 800 micrograms (mcg) of folic acid every day if you become pregnant. ?Ask for birth control (contraception) if you want to prevent pregnancy. ?Osteoporosis and menopause ?Osteoporosis is a disease in which the bones lose minerals and strength with aging. This can result in bone fractures. If you are 69 years old or older, or if you are at risk for osteoporosis and fractures, ask your health care provider if you should: ?Be screened for bone loss. ?Take a calcium or vitamin D supplement to lower your risk of fractures. ?Be given hormone replacement therapy (HRT) to treat symptoms of menopause. ?Follow these instructions at home: ?Alcohol use ?Do not drink alcohol if: ?Your health care provider tells you not to drink. ?You are pregnant, may be pregnant, or are planning to become pregnant. ?If you drink alcohol: ?Limit how much you have to: ?0-1 drink a day. ?Know how much alcohol is in your drink. In the [place] one drink equals one 12 oz bottle of beer (355 mL), one 5 oz glass of wine (148 mL), or one 1? oz glass of hard liquor (44 mL). ?Lifestyle ?Do not use any products that contain nicotine or tobacco. These products include cigarettes, chewing tobacco, and vaping devices, such as e-cigarettes. If you need help quitting, ask your health care provider. ?Do not use street drugs. ?Do not share needles. ?Ask your health care provider for help if you need support or information about quitting drugs. ?General instructions ?Schedule regular health, dental, and eye exams. ?Stay current with your vaccines. ?Tell your health care provider if: ?You often feel depressed. ?You have ever been abused or do not feel safe at home. ?Summary ?Adopting a healthy lifestyle and getting preventive care are important in promoting health  and wellness. ?Follow your health care provider's instructions about healthy diet, exercising, and getting tested or screened for diseases. ?Follow your health care provider's instructions on monitoring your cholesterol and blood pressure. ?This information is not intended to replace advice given to you by your health care provider. Make sure you discuss any questions you have with your health care provider. ?Document Revised: 03/06/2021 Document Reviewed: 03/06/2021 ?Elsevier Patient Education ? [place]. ? ?

## 2022-02-05 ENCOUNTER — Ambulatory Visit (INDEPENDENT_AMBULATORY_CARE_PROVIDER_SITE_OTHER): Payer: Medicare HMO | Admitting: Internal Medicine

## 2022-02-05 VITALS — BP 142/76 | HR 75 | Temp 98.2°F | Ht 63.0 in | Wt 187.5 lb

## 2022-02-05 DIAGNOSIS — E559 Vitamin D deficiency, unspecified: Secondary | ICD-10-CM

## 2022-02-05 DIAGNOSIS — Z0000 Encounter for general adult medical examination without abnormal findings: Secondary | ICD-10-CM

## 2022-02-05 DIAGNOSIS — C679 Malignant neoplasm of bladder, unspecified: Secondary | ICD-10-CM | POA: Diagnosis not present

## 2022-02-05 DIAGNOSIS — I700 Atherosclerosis of aorta: Secondary | ICD-10-CM | POA: Diagnosis not present

## 2022-02-05 DIAGNOSIS — M85859 Other specified disorders of bone density and structure, unspecified thigh: Secondary | ICD-10-CM

## 2022-02-05 DIAGNOSIS — M549 Dorsalgia, unspecified: Secondary | ICD-10-CM | POA: Diagnosis not present

## 2022-02-05 DIAGNOSIS — R42 Dizziness and giddiness: Secondary | ICD-10-CM

## 2022-02-05 DIAGNOSIS — I10 Essential (primary) hypertension: Secondary | ICD-10-CM | POA: Diagnosis not present

## 2022-02-05 DIAGNOSIS — R7303 Prediabetes: Secondary | ICD-10-CM

## 2022-02-05 DIAGNOSIS — F419 Anxiety disorder, unspecified: Secondary | ICD-10-CM

## 2022-02-05 LAB — CBC WITH DIFFERENTIAL/PLATELET
Basophils Absolute: 0.1 10*3/uL (ref 0.0–0.1)
Basophils Relative: 0.7 % (ref 0.0–3.0)
Eosinophils Absolute: 0.3 10*3/uL (ref 0.0–0.7)
Eosinophils Relative: 3.9 % (ref 0.0–5.0)
HCT: 37.4 % (ref 36.0–46.0)
Hemoglobin: 12.8 g/dL (ref 12.0–15.0)
Lymphocytes Relative: 26 % (ref 12.0–46.0)
Lymphs Abs: 1.9 10*3/uL (ref 0.7–4.0)
MCHC: 34.2 g/dL (ref 30.0–36.0)
MCV: 90.9 fl (ref 78.0–100.0)
Monocytes Absolute: 0.7 10*3/uL (ref 0.1–1.0)
Monocytes Relative: 9 % (ref 3.0–12.0)
Neutro Abs: 4.4 10*3/uL (ref 1.4–7.7)
Neutrophils Relative %: 60.4 % (ref 43.0–77.0)
Platelets: 313 10*3/uL (ref 150.0–400.0)
RBC: 4.12 Mil/uL (ref 3.87–5.11)
RDW: 13.1 % (ref 11.5–15.5)
WBC: 7.4 10*3/uL (ref 4.0–10.5)

## 2022-02-05 LAB — LIPID PANEL
Cholesterol: 168 mg/dL (ref 0–200)
HDL: 53.6 mg/dL (ref >39.00)
LDL Cholesterol: 88 mg/dL (ref 0–99)
NonHDL: 114.28
Total CHOL/HDL Ratio: 3
Triglycerides: 133 mg/dL (ref 0.0–149.0)
VLDL: 26.6 mg/dL (ref 0.0–40.0)

## 2022-02-05 LAB — COMPREHENSIVE METABOLIC PANEL
ALT: 14 U/L (ref 0–35)
AST: 15 U/L (ref 0–37)
Albumin: 4.6 g/dL (ref 3.5–5.2)
Alkaline Phosphatase: 77 U/L (ref 39–117)
BUN: 14 mg/dL (ref 6–23)
CO2: 29 mEq/L (ref 19–32)
Calcium: 10.1 mg/dL (ref 8.4–10.5)
Chloride: 102 mEq/L (ref 96–112)
Creatinine, Ser: 0.82 mg/dL (ref 0.40–1.20)
GFR: 74.74 mL/min (ref >60.00)
Glucose, Bld: 90 mg/dL (ref 70–99)
Potassium: 4.4 mEq/L (ref 3.5–5.1)
Sodium: 139 mEq/L (ref 135–145)
Total Bilirubin: 0.6 mg/dL (ref 0.2–1.2)
Total Protein: 7.6 g/dL (ref 6.0–8.3)

## 2022-02-05 LAB — TSH: TSH: 1.25 u[IU]/mL (ref 0.35–5.50)

## 2022-02-05 LAB — VITAMIN D 25 HYDROXY (VIT D DEFICIENCY, FRACTURES): VITD: 27.78 ng/mL — ABNORMAL LOW (ref 30.00–100.00)

## 2022-02-05 LAB — HEMOGLOBIN A1C: Hgb A1c MFr Bld: 6 % (ref 4.6–6.5)

## 2022-02-05 MED ORDER — LORAZEPAM 0.5 MG PO TABS: 0.5000 mg | ORAL_TABLET | Freq: Three times a day (TID) | ORAL | 0 refills | Status: DC | PRN

## 2022-02-05 NOTE — Assessment & Plan Note (Signed)
Chronic Taking vitamin D daily Check vitamin D level  

## 2022-02-05 NOTE — Assessment & Plan Note (Signed)
Chronic ?Ongoing almost 3 years-intermittent ?Has gotten better ?Sounds more musculoskeletal in nature-worse with sitting, no pain with standing or laying ?No radiation ?Continue Tylenol as needed ?If persist can refer to sports medicine for further evaluation ?

## 2022-02-05 NOTE — Assessment & Plan Note (Signed)
Acute ?Occasional dizziness/lightheadedness with feeling warm and getting anxious ?Not daily ?Occurring mostly late morning ??  Hypoglycemia ?Advised her to try eating something midmorning and making sure she is getting enough protein in her breakfast see if that helps ?

## 2022-02-05 NOTE — Assessment & Plan Note (Signed)
Diagnosed 2022 ?Status post gemcitabine ?Cystoscopy 3 months ago-lesion cauterized ?Repeat cystoscopy next week ?

## 2022-02-05 NOTE — Assessment & Plan Note (Signed)
Chronic ?DEXA up-to-date ?Continue walking, regular exercise ?Continue calcium and vitamin D ?Check vitamin D level ?

## 2022-02-05 NOTE — Assessment & Plan Note (Signed)
Chronic ?Seen on CT scan ?Stressed regular exercise-she is doing some walking and some bands ?Healthy diet ?Check lipid panel-LDL has been at goal without medication so at this point just advised her to work on lifestyle ?

## 2022-02-05 NOTE — Assessment & Plan Note (Signed)
Chronic ?Controlled, Stable ?Continue Ativan 0.5 mg every 8 hours ? ?

## 2022-02-05 NOTE — Assessment & Plan Note (Signed)
Chronic ?Blood pressure borderline-controlled ?CMP ?Continue losartan 25 mg daily ?

## 2022-02-05 NOTE — Assessment & Plan Note (Signed)
Chronic Check a1c Low sugar / carb diet Stressed regular exercise  

## 2022-02-12 ENCOUNTER — Telehealth: Payer: Self-pay | Admitting: Pulmonary Disease

## 2022-02-12 NOTE — Telephone Encounter (Signed)
Spoke with the pt  ?I cancelled visit for PFT and virtual with JD  ?She only needed 10 mo recall and I have placed this  ?Nothing further needed ?

## 2022-02-13 DIAGNOSIS — C678 Malignant neoplasm of overlapping sites of bladder: Secondary | ICD-10-CM | POA: Diagnosis not present

## 2022-02-14 ENCOUNTER — Ambulatory Visit (INDEPENDENT_AMBULATORY_CARE_PROVIDER_SITE_OTHER): Payer: Medicare HMO

## 2022-02-14 DIAGNOSIS — Z0000 Encounter for general adult medical examination without abnormal findings: Secondary | ICD-10-CM

## 2022-02-14 NOTE — Progress Notes (Signed)
?I connected with Angel Williamson today by telephone and verified that I am speaking with the correct person using two identifiers. ?Location patient: home ?Location provider: work ?Persons participating in the virtual visit: patient, provider. ?  ?I discussed the limitations, risks, security and privacy concerns of performing an evaluation and management service by telephone and the availability of in person appointments. I also discussed with the patient that there may be a patient responsible charge related to this service. The patient expressed understanding and verbally consented to this telephonic visit.  ?  ?Interactive audio and video telecommunications were attempted between this provider and patient, however failed, due to patient having technical difficulties OR patient did not have access to video capability.  We continued and completed visit with audio only. ? ?Some vital signs may be absent or patient reported.  ? ?Time Spent with patient on telephone encounter: 30 minutes ? ?Subjective:  ? Angel Williamson is a 66 y.o. female who presents for Medicare Annual (Subsequent) preventive examination. ? ?Review of Systems    ? ?Cardiac Risk Factors include: advanced age (>61mn, >>39women);family history of premature cardiovascular disease;hypertension ? ?   ?Objective:  ?  ?There were no vitals filed for this visit. ?There is no height or weight on file to calculate BMI. ? ? ?  02/14/2022  ? 11:03 AM 08/21/2021  ? 10:35 AM 07/06/2021  ?  6:04 AM  ?Advanced Directives  ?Does Patient Have a Medical Advance Directive? Yes Yes Yes  ?Type of Advance Directive Living will;Healthcare Power of AGates ?Does patient want to make changes to medical advance directive? No - Patient declined    ?Copy of [place] Chart? No - copy requested  No - copy requested  ? ? ?Current Medications (verified) ?Outpatient Encounter Medications as of 02/14/2022  ?Medication Sig  ? Calcium  Carbonate (CALCIUM 500 PO) Take 1,000 mg by mouth daily.  ? Cholecalciferol (D3 ADULT PO) Take 2,000 Units by mouth daily.  ? EPINEPHrine 0.3 mg/0.3 mL IJ SOAJ injection Use as directed for life-threatening allergic reaction.  ? fluticasone (FLONASE) 50 MCG/ACT nasal spray Place 1 spray into both nostrils at bedtime.  ? LORazepam (ATIVAN) 0.5 MG tablet Take 1 tablet (0.5 mg total) by mouth every 8 (eight) hours as needed for anxiety.  ? losartan (COZAAR) 25 MG tablet TAKE 1 TABLET (25 MG TOTAL) BY MOUTH DAILY.  ? ?No facility-administered encounter medications on file as of 02/14/2022.  ? ? ?Allergies (verified) ?Hydrochlorothiazide, Amlodipine, Losartan potassium, Shellfish allergy, Spironolactone, and Benazepril hcl  ? ?History: ?Past Medical History:  ?Diagnosis Date  [place]   [place]rolog[place]-- dr hLouis Meckel ? Breast fibrocystic disorder   ? History of adenomatous polyp of colon   ? History of COVID-19 03/2021  ? per pt positive home test ,  mild symptoms that resolved  ? Hypertension   ? followed by pcp    (ETT in epic 07-09-2014 normal)  ? Lung nodule   ? followed by dr sHalford Chessman ? OSA on CPAP   ? followed by dr sHalford Chessman-- study in epic [id number] severe osa  ? Osteopenia   ? Renal cyst, left   ? Varicose veins of both lower extremities   ? ?Past Surgical History:  ?Procedure Laterality Date  ? COLONOSCOPY  2013  ? NO PAST SURGERIES    ? 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 Procedure: TRANSURETHRAL RESECTION  OF BLADDER TUMOR BILATERAL RETROGRADE PYELOGRAM WITH GEMCITABINE INSTILLATION IN PACU;  Surgeon: Ardis Hughs, MD;  Location: [facility];  Service: Urology;  Laterality: Bilateral;  ? ?Family History  ?Problem Relation Age of Onset  ? Anemia Mother   ? Colon polyps Father   ? Diabetes Maternal Grandfather   ? Cancer Paternal Grandmother   ?     stomach  ? Stomach cancer Paternal Grandmother   ? Stroke Paternal Grandfather 50  ? Asthma Son    ? Anemia Other   ?     Cousin   ? Colon cancer Neg Hx   ? Esophageal cancer Neg Hx   ? Rectal cancer Neg Hx   ? ?Social History  ? ?Socioeconomic History  ? Marital status: Married  ?  Spouse name: Not on file  ? Number of children: 2  ? Years of education: Not on file  ? Highest education level: Associate degree: academic program  ?Occupational History  ? Occupation: Surveyor, minerals  ?  Comment: [employer]  ?Tobacco Use  ? Smoking status: Former  ?  Packs/day: 1.00  ?  Years: 15.00  ?  Pack years: 15.00  ?  Types: Cigarettes  ?  Quit date: 16  ?  Years since quitting: 35.3  ? Smokeless tobacco: Never  ?Vaping Use  ? Vaping Use: Never used  ?Substance and Sexual Activity  ? Alcohol use: Never  ? Drug use: Never  ? Sexual activity: Not on file  ?Other Topics Concern  ? Not on file  ?Social History Narrative  ? Lives with husband  ? Caffeine none since 2015  ? ?Social Determinants of Health  ? ?Financial Resource Strain: Low Risk   ? Difficulty of Paying Living Expenses: Not hard at all  ?Food Insecurity: No Food Insecurity  ? Worried About Charity fundraiser in the Last Year: Never true  ? Ran Out of Food in the Last Year: Never true  ?Transportation Needs: No Transportation Needs  ? Lack of Transportation (Medical): No  ? Lack of Transportation (Non-Medical): No  ?Physical Activity: Sufficiently Active  ? Days of Exercise per Week: 5 days  ? Minutes of Exercise per Session: 30 min  ?Stress: No Stress Concern Present  ? Feeling of Stress : Not at all  ?Social Connections: Socially Integrated  ? Frequency of Communication with Friends and Family: More than three times a week  ? Frequency of Social Gatherings with Friends and Family: More than three times a week  ? Attends Religious Services: More than 4 times per year  ? Active Member of Clubs or Organizations: Yes  ? Attends Archivist Meetings: More than 4 times per year  ? Marital Status: Married  ? ? ?Tobacco Counseling ?Counseling given: Not  Answered ? ? ?Clinical Intake: ? ?Pre-visit preparation completed: Yes ? ?Pain : No/denies pain ? ?  ? ?Nutritional Risks: None ?Diabetes: No ? ?How often do you need to have someone help you when you read instructions, pamphlets, or other written materials from your doctor or pharmacy?: 1 - Never ?What is the last grade level you completed in school?: 3 years of college ? ?Diabetic? no ? ?Interpreter Needed?: No ? ?Information entered by :: Lisette Abu, LPN ? ? ?Activities of Daily Living ? ?  02/14/2022  ? 11:16 AM 07/06/2021  ?  6:09 AM  ?In your present state of health, do you have any difficulty performing the following activities:  ?Hearing? 0  0  ?Vision? 0 0  ?Difficulty concentrating or making decisions? 0 0  ?Walking or climbing stairs? 0 0  ?Dressing or bathing? 0 0  ?Doing errands, shopping? 0   ?Preparing Food and eating ? N   ?Using the Toilet? N   ?In the past six months, have you accidently leaked urine? N   ?Do you have problems with loss of bowel control? N   ?Managing your Medications? N   ?Managing your Finances? N   ?Housekeeping or managing your Housekeeping? N   ? ? ?Patient Care Team: ?Binnie Rail, MD as PCP - General (Internal Medicine) ?Shawnie Dapper, DO as Consulting Physician (Ophthalmology) ? ?Indicate any recent Medical Services you may have received from other than [facility] providers in the past year (date may be approximate). ? ?   ?Assessment:  ? This is a routine wellness examination for Angel Williamson. ? ?Hearing/Vision screen ?Hearing Screening - Comments:: Patient denied any hearing difficulty.   ?No hearing aids. ? ? ?Vision Screening - Comments:: Patient does wear corrective lenses/contacts.  ?Eye exam done by: [facility]. D[facility]el [facility] at [facility] ? ? ? ?Dietary issues and exercise activities discussed: ?Current Exercise Habits: Home exercise routine, Type of exercise: walking, Time (Minutes): 30, Frequency (Times/Week): 5, Weekly Exercise (Minutes/Week): 150, Intensity: Moderate,  Exercise limited by: None identified ? ? Goals Addressed   ? ?  ?  ?  ?  ? This Visit's Progress  ?  To maintain my current health status by continuing to eat healthy, stay physically active and socially

## 2022-02-14 NOTE — Patient Instructions (Signed)
Angel Williamson , ?Thank you for taking time to come for your Medicare Wellness Visit. I appreciate your ongoing commitment to your health goals. Please review the following plan we discussed and let me know if I can assist you in the future.  ? ?Screening recommendations/referrals: ?Colonoscopy: 05/08/2013; due every 10 years ?Mammogram: 12/27/2020; due every year ?Bone Density: 01/26/2021; due every 3 years ?Recommended yearly ophthalmology/optometry visit for glaucoma screening and checkup ?Recommended yearly dental visit for hygiene and checkup ? ?Vaccinations: ?Influenza vaccine: declined ?Pneumococcal vaccine: declined ?Tdap vaccine: declined ?Shingles vaccine: declined   ?Covid-19: 01/18/2020, 02/08/2020, 09/16/2020 ? ?Advanced directives: Yes; Please bring a copy of your health care power of attorney and living will to the office at your convenience. ? ?Conditions/risks identified: Yes ? ?Next appointment: Please schedule your next Medicare Wellness Visit with your Nurse Health Advisor in 1 year by calling [phone]. ? ? ?Preventive Care 66 Years and Older, Female ?Preventive care refers to lifestyle choices and visits with your health care provider that can promote health and wellness. ?What does preventive care include? ?A yearly physical exam. This is also called an annual well check. ?Dental exams once or twice a year. ?Routine eye exams. Ask your health care provider how often you should have your eyes checked. ?Personal lifestyle choices, including: ?Daily care of your teeth and gums. ?Regular physical activity. ?Eating a healthy diet. ?Avoiding tobacco and drug use. ?Limiting alcohol use. ?Practicing safe sex. ?Taking low-dose aspirin every day. ?Taking vitamin and mineral supplements as recommended by your health care provider. ?What happens during an annual well check? ?The services and screenings done by your health care provider during your annual well check will depend on your age, overall health,  lifestyle risk factors, and family history of disease. ?Counseling  ?Your health care provider may ask you questions about your: ?Alcohol use. ?Tobacco use. ?Drug use. ?Emotional well-being. ?Home and relationship well-being. ?Sexual activity. ?Eating habits. ?History of falls. ?Memory and ability to understand (cognition). ?Work and work Statistician. ?Reproductive health. ?Screening  ?You may have the following tests or measurements: ?Height, weight, and BMI. ?Blood pressure. ?Lipid and cholesterol levels. These may be checked every 5 years, or more frequently if you are over 28 years old. ?Skin check. ?Lung cancer screening. You may have this screening every year starting at age 23 if you have a 30-pack-year history of smoking and currently smoke or have quit within the past 15 years. ?Fecal occult blood test (FOBT) of the stool. You may have this test every year starting at age 80. ?Flexible sigmoidoscopy or colonoscopy. You may have a sigmoidoscopy every 5 years or a colonoscopy every 10 years starting at age 43. ?Hepatitis C blood test. ?Hepatitis B blood test. ?Sexually transmitted disease (STD) testing. ?Diabetes screening. This is done by checking your blood sugar (glucose) after you have not eaten for a while (fasting). You may have this done every 1-3 years. ?Bone density scan. This is done to screen for osteoporosis. You may have this done starting at age 46. ?Mammogram. This may be done every 1-2 years. Talk to your health care provider about how often you should have regular mammograms. ?Talk with your health care provider about your test results, treatment options, and if necessary, the need for more tests. ?Vaccines  ?Your health care provider may recommend certain vaccines, such as: ?Influenza vaccine. This is recommended every year. ?Tetanus, diphtheria, and acellular pertussis (Tdap, Td) vaccine. You may need a Td booster every 10 years. ?Zoster vaccine. You  may need this after age  23. ?Pneumococcal 13-valent conjugate (PCV13) vaccine. One dose is recommended after age 57. ?Pneumococcal polysaccharide (PPSV23) vaccine. One dose is recommended after age 81. ?Talk to your health care provider about which screenings and vaccines you need and how often you need them. ?This information is not intended to replace advice given to you by your health care provider. Make sure you discuss any questions you have with your health care provider. ?Document Released: 11/11/2015 Document Revised: 07/04/2016 Document Reviewed: 08/16/2015 ?Elsevier Interactive Patient Education ? 2017 [place]. ? ?Fall Prevention in the Home ?Falls can cause injuries. They can happen to people of all ages. There are many things you can do to make your home safe and to help prevent falls. ?What can I do on the outside of my home? ?Regularly fix the edges of walkways and driveways and fix any cracks. ?Remove anything that might make you trip as you walk through a door, such as a raised step or threshold. ?Trim any bushes or trees on the path to your home. ?Use bright outdoor lighting. ?Clear any walking paths of anything that might make someone trip, such as rocks or tools. ?Regularly check to see if handrails are loose or broken. Make sure that both sides of any steps have handrails. ?Any raised decks and porches should have guardrails on the edges. ?Have any leaves, snow, or ice cleared regularly. ?Use sand or salt on walking paths during winter. ?Clean up any spills in your garage right away. This includes oil or grease spills. ?What can I do in the bathroom? ?Use night lights. ?Install grab bars by the toilet and in the tub and shower. Do not use towel bars as grab bars. ?Use non-skid mats or decals in the tub or shower. ?If you need to sit down in the shower, use a plastic, non-slip stool. ?Keep the floor dry. Clean up any water that spills on the floor as soon as it happens. ?Remove soap buildup in the tub or shower  regularly. ?Attach bath mats securely with double-sided non-slip rug tape. ?Do not have throw rugs and other things on the floor that can make you trip. ?What can I do in the bedroom? ?Use night lights. ?Make sure that you have a light by your bed that is easy to reach. ?Do not use any sheets or blankets that are too big for your bed. They should not hang down onto the floor. ?Have a firm chair that has side arms. You can use this for support while you get dressed. ?Do not have throw rugs and other things on the floor that can make you trip. ?What can I do in the kitchen? ?Clean up any spills right away. ?Avoid walking on wet floors. ?Keep items that you use a lot in easy-to-reach places. ?If you need to reach something above you, use a strong step stool that has a grab bar. ?Keep electrical cords out of the way. ?Do not use floor polish or wax that makes floors slippery. If you must use wax, use non-skid floor wax. ?Do not have throw rugs and other things on the floor that can make you trip. ?What can I do with my stairs? ?Do not leave any items on the stairs. ?Make sure that there are handrails on both sides of the stairs and use them. Fix handrails that are broken or loose. Make sure that handrails are as long as the stairways. ?Check any carpeting to make sure that it is firmly  attached to the stairs. Fix any carpet that is loose or worn. ?Avoid having throw rugs at the top or bottom of the stairs. If you do have throw rugs, attach them to the floor with carpet tape. ?Make sure that you have a light switch at the top of the stairs and the bottom of the stairs. If you do not have them, ask someone to add them for you. ?What else can I do to help prevent falls? ?Wear shoes that: ?Do not have high heels. ?Have rubber bottoms. ?Are comfortable and fit you well. ?Are closed at the toe. Do not wear sandals. ?If you use a stepladder: ?Make sure that it is fully opened. Do not climb a closed stepladder. ?Make sure that  both sides of the stepladder are locked into place. ?Ask someone to hold it for you, if possible. ?Clearly mark and make sure that you can see: ?Any grab bars or handrails. ?First and last steps. ?Where the edge of each step is

## 2022-02-16 ENCOUNTER — Telehealth: Payer: Medicare HMO | Admitting: Pulmonary Disease

## 2022-03-19 DIAGNOSIS — M546 Pain in thoracic spine: Secondary | ICD-10-CM | POA: Diagnosis not present

## 2022-03-19 DIAGNOSIS — M79671 Pain in right foot: Secondary | ICD-10-CM | POA: Diagnosis not present

## 2022-05-29 DIAGNOSIS — N952 Postmenopausal atrophic vaginitis: Secondary | ICD-10-CM | POA: Diagnosis not present

## 2022-05-29 DIAGNOSIS — Z01419 Encounter for gynecological examination (general) (routine) without abnormal findings: Secondary | ICD-10-CM | POA: Diagnosis not present

## 2022-05-31 ENCOUNTER — Ambulatory Visit: Payer: Self-pay | Admitting: Licensed Clinical Social Worker

## 2022-05-31 NOTE — Patient Outreach (Signed)
  Care Coordination   Initial Visit Note   05/31/2022 Name: Angel Williamson MRN: [id number] DOB: 1956/10/22  Angel Williamson is a 66 y.o. year old female who sees Burns, Claudina Lick, MD for primary care. I spoke with  Vladimir Creeks by phone today  What matters to the patients health and wellness today?  Patient is not interested in care coordination services.   Goals Addressed   None    SDOH assessments and interventions completed:  No   Care Coordination Interventions Activated:  No  Care Coordination Interventions:  No, not indicated   Follow up plan: No further intervention required.   Encounter Outcome:  Pt. [place], [place] [phone]

## 2022-06-20 DIAGNOSIS — H35373 Puckering of macula, bilateral: Secondary | ICD-10-CM | POA: Diagnosis not present

## 2022-06-20 DIAGNOSIS — H2513 Age-related nuclear cataract, bilateral: Secondary | ICD-10-CM | POA: Diagnosis not present

## 2022-06-20 DIAGNOSIS — H43813 Vitreous degeneration, bilateral: Secondary | ICD-10-CM | POA: Diagnosis not present

## 2022-06-20 DIAGNOSIS — H35033 Hypertensive retinopathy, bilateral: Secondary | ICD-10-CM | POA: Diagnosis not present

## 2022-07-13 ENCOUNTER — Other Ambulatory Visit: Payer: Self-pay | Admitting: Internal Medicine

## 2022-08-08 ENCOUNTER — Other Ambulatory Visit: Payer: Self-pay | Admitting: Internal Medicine

## 2022-08-08 DIAGNOSIS — Z1231 Encounter for screening mammogram for malignant neoplasm of breast: Secondary | ICD-10-CM

## 2022-08-14 DIAGNOSIS — C678 Malignant neoplasm of overlapping sites of bladder: Secondary | ICD-10-CM | POA: Diagnosis not present

## 2022-08-16 ENCOUNTER — Other Ambulatory Visit: Payer: Self-pay | Admitting: Urology

## 2022-09-05 ENCOUNTER — Encounter (HOSPITAL_BASED_OUTPATIENT_CLINIC_OR_DEPARTMENT_OTHER): Payer: Self-pay | Admitting: Urology

## 2022-09-05 NOTE — Progress Notes (Signed)
Spoke w/ via phone for pre-op interview--- Rew----    EKG           Lab results------ COVID test -----patient states asymptomatic no test needed Arrive at -------0530 NPO after MN NO Solid Food.  Med rec completed Medications to take morning of surgery ----- Ativan PRN Diabetic medication ----- Patient instructed no nail polish to be worn day of surgery Patient instructed to bring photo id and insurance card day of surgery Patient aware to have Driver (ride ) / caregiver   Angel Williamson (Husband) for 24 hours after surgery  Patient Special Instructions ----- Pre-Op special Istructions ----- Patient verbalized understanding of instructions that were given at this phone interview. Patient denies shortness of breath, chest pain, fever, cough at this phone interview.

## 2022-09-06 NOTE — Anesthesia Preprocedure Evaluation (Addendum)
Anesthesia Evaluation  Patient identified by MRN, date of birth, ID band Patient awake    Reviewed: Allergy & Precautions, NPO status , Patient's Chart, lab work & pertinent test results, reviewed documented beta blocker date and time   Airway Mallampati: I  TM Distance: >3 FB     Dental no notable dental hx. (+) Teeth Intact, Caps, Dental Advisory Given   Pulmonary sleep apnea and Continuous Positive Airway Pressure Ventilation , former smoker   Pulmonary exam normal breath sounds clear to auscultation       Cardiovascular hypertension, Pt. on medications Normal cardiovascular exam Rhythm:Regular Rate:Normal     Neuro/Psych  PSYCHIATRIC DISORDERS Anxiety     negative neurological ROS     GI/Hepatic negative GI ROS, Neg liver ROS,,,  Endo/Other  negative endocrine ROS    Renal/GU Renal diseaseRenal cyst   Recurrent Bladder Ca    Musculoskeletal negative musculoskeletal ROS (+)    Abdominal  (+) + obese  Peds  Hematology negative hematology ROS (+)   Anesthesia Other Findings   Reproductive/Obstetrics                             Anesthesia Physical Anesthesia Plan  ASA: 2  Anesthesia Plan: General   Post-op Pain Management:    Induction:   PONV Risk Score and Plan: 4 or greater and Ondansetron, Dexamethasone, Midazolam and Treatment may vary due to age or medical condition  Airway Management Planned: LMA  Additional Equipment: None  Intra-op Plan:   Post-operative Plan: Extubation in OR  Informed Consent: I have reviewed the patients History and Physical, chart, labs and discussed the procedure including the risks, benefits and alternatives for the proposed anesthesia with the patient or authorized representative who has indicated his/her understanding and acceptance.     Dental advisory given  Plan Discussed with: CRNA and Anesthesiologist  Anesthesia Plan Comments:          Anesthesia Quick Evaluation

## 2022-09-07 ENCOUNTER — Encounter (HOSPITAL_BASED_OUTPATIENT_CLINIC_OR_DEPARTMENT_OTHER)
Admission: RE | Disposition: A | Discharge status: Home / Self Care | Payer: Self-pay | Source: Home / Self Care | Attending: Urology

## 2022-09-07 ENCOUNTER — Ambulatory Visit (HOSPITAL_BASED_OUTPATIENT_CLINIC_OR_DEPARTMENT_OTHER): Payer: Medicare HMO | Admitting: Anesthesiology

## 2022-09-07 ENCOUNTER — Ambulatory Visit (HOSPITAL_BASED_OUTPATIENT_CLINIC_OR_DEPARTMENT_OTHER)
Admission: RE | Admit: 2022-09-07 | Discharge: 2022-09-07 | Disposition: A | Discharge status: Home / Self Care | Payer: Medicare HMO | Attending: Urology | Admitting: Urology

## 2022-09-07 ENCOUNTER — Encounter (HOSPITAL_BASED_OUTPATIENT_CLINIC_OR_DEPARTMENT_OTHER): Payer: Self-pay | Admitting: Urology

## 2022-09-07 DIAGNOSIS — C678 Malignant neoplasm of overlapping sites of bladder: Secondary | ICD-10-CM | POA: Diagnosis not present

## 2022-09-07 DIAGNOSIS — Z6834 Body mass index (BMI) 34.0-34.9, adult: Secondary | ICD-10-CM | POA: Diagnosis not present

## 2022-09-07 DIAGNOSIS — E669 Obesity, unspecified: Secondary | ICD-10-CM | POA: Insufficient documentation

## 2022-09-07 DIAGNOSIS — G4730 Sleep apnea, unspecified: Secondary | ICD-10-CM | POA: Diagnosis not present

## 2022-09-07 DIAGNOSIS — C672 Malignant neoplasm of lateral wall of bladder: Secondary | ICD-10-CM

## 2022-09-07 DIAGNOSIS — Z87891 Personal history of nicotine dependence: Secondary | ICD-10-CM | POA: Insufficient documentation

## 2022-09-07 DIAGNOSIS — D090 Carcinoma in situ of bladder: Secondary | ICD-10-CM | POA: Diagnosis not present

## 2022-09-07 DIAGNOSIS — C679 Malignant neoplasm of bladder, unspecified: Secondary | ICD-10-CM

## 2022-09-07 DIAGNOSIS — I10 Essential (primary) hypertension: Secondary | ICD-10-CM | POA: Diagnosis not present

## 2022-09-07 HISTORY — DX: Anxiety disorder, unspecified: F41.9

## 2022-09-07 HISTORY — PX: TRANSURETHRAL RESECTION OF BLADDER TUMOR WITH MITOMYCIN-C: SHX6459

## 2022-09-07 SURGERY — TRANSURETHRAL RESECTION OF BLADDER TUMOR WITH MITOMYCIN-C
Anesthesia: General | Site: Bladder | Laterality: Bilateral

## 2022-09-07 MED ORDER — EPHEDRINE 5 MG/ML INJ
INTRAVENOUS | Status: AC
  Filled 2022-09-07: qty 5

## 2022-09-07 MED ORDER — ONDANSETRON HCL 4 MG/2ML IJ SOLN: 4.0000 mg | Freq: Once | INTRAMUSCULAR | Status: DC | PRN

## 2022-09-07 MED ORDER — TRAMADOL HCL 50 MG PO TABS: 50.0000 mg | ORAL_TABLET | Freq: Four times a day (QID) | ORAL | 0 refills | Status: DC | PRN

## 2022-09-07 MED ORDER — ONDANSETRON HCL 4 MG/2ML IJ SOLN
INTRAMUSCULAR | Status: AC
  Filled 2022-09-07: qty 2

## 2022-09-07 MED ORDER — CIPROFLOXACIN IN D5W 400 MG/200ML IV SOLN
400.0000 mg | INTRAVENOUS | Status: AC
  Administered 2022-09-07: 400 mg via INTRAVENOUS

## 2022-09-07 MED ORDER — FENTANYL CITRATE (PF) 100 MCG/2ML IJ SOLN
INTRAMUSCULAR | Status: AC
  Filled 2022-09-07: qty 2

## 2022-09-07 MED ORDER — ONDANSETRON HCL 4 MG/2ML IJ SOLN
INTRAMUSCULAR | Status: DC | PRN
  Administered 2022-09-07: 4 mg via INTRAVENOUS

## 2022-09-07 MED ORDER — FENTANYL CITRATE (PF) 100 MCG/2ML IJ SOLN
INTRAMUSCULAR | Status: DC | PRN
  Administered 2022-09-07: 75 ug via INTRAVENOUS
  Administered 2022-09-07 (×3): 25 ug via INTRAVENOUS
  Administered 2022-09-07: 50 ug via INTRAVENOUS

## 2022-09-07 MED ORDER — DEXAMETHASONE SODIUM PHOSPHATE 4 MG/ML IJ SOLN
INTRAMUSCULAR | Status: DC | PRN
  Administered 2022-09-07: 5 mg via INTRAVENOUS

## 2022-09-07 MED ORDER — DEXAMETHASONE SODIUM PHOSPHATE 10 MG/ML IJ SOLN
INTRAMUSCULAR | Status: AC
  Filled 2022-09-07: qty 1

## 2022-09-07 MED ORDER — PROPOFOL 10 MG/ML IV BOLUS
INTRAVENOUS | Status: AC
  Filled 2022-09-07: qty 20

## 2022-09-07 MED ORDER — IOHEXOL 300 MG/ML  SOLN
INTRAMUSCULAR | Status: DC | PRN
  Administered 2022-09-07: 10 mL via URETHRAL

## 2022-09-07 MED ORDER — LACTATED RINGERS IV SOLN: INTRAVENOUS | Status: DC

## 2022-09-07 MED ORDER — SODIUM CHLORIDE 0.9 % IR SOLN
Status: DC | PRN
  Administered 2022-09-07 (×3): 3000 mL

## 2022-09-07 MED ORDER — PHENAZOPYRIDINE HCL 200 MG PO TABS: 200.0000 mg | ORAL_TABLET | Freq: Three times a day (TID) | ORAL | 0 refills | Status: DC | PRN

## 2022-09-07 MED ORDER — MIDAZOLAM HCL 2 MG/2ML IJ SOLN
INTRAMUSCULAR | Status: AC
  Filled 2022-09-07: qty 2

## 2022-09-07 MED ORDER — LIDOCAINE HCL (CARDIAC) PF 100 MG/5ML IV SOSY
PREFILLED_SYRINGE | INTRAVENOUS | Status: DC | PRN
  Administered 2022-09-07: 80 mg via INTRAVENOUS

## 2022-09-07 MED ORDER — CIPROFLOXACIN IN D5W 400 MG/200ML IV SOLN
INTRAVENOUS | Status: AC
  Filled 2022-09-07: qty 200

## 2022-09-07 MED ORDER — OXYCODONE HCL 5 MG PO TABS
5.0000 mg | ORAL_TABLET | Freq: Once | ORAL | Status: AC | PRN
  Administered 2022-09-07: 5 mg via ORAL

## 2022-09-07 MED ORDER — GEMCITABINE CHEMO FOR BLADDER INSTILLATION 2000 MG: 2000.0000 mg | Freq: Once | INTRAVENOUS | Status: DC

## 2022-09-07 MED ORDER — SULFAMETHOXAZOLE-TRIMETHOPRIM 800-160 MG PO TABS: 1.0000 | ORAL_TABLET | Freq: Two times a day (BID) | ORAL | 0 refills | Status: DC

## 2022-09-07 MED ORDER — FENTANYL CITRATE (PF) 100 MCG/2ML IJ SOLN: 25.0000 ug | INTRAMUSCULAR | Status: DC | PRN

## 2022-09-07 MED ORDER — EPHEDRINE SULFATE (PRESSORS) 50 MG/ML IJ SOLN
INTRAMUSCULAR | Status: DC | PRN
  Administered 2022-09-07 (×2): 10 mg via INTRAVENOUS

## 2022-09-07 MED ORDER — STERILE WATER FOR IRRIGATION IR SOLN
Status: DC | PRN
  Administered 2022-09-07: 1000 mL

## 2022-09-07 MED ORDER — MIDAZOLAM HCL 5 MG/5ML IJ SOLN
INTRAMUSCULAR | Status: DC | PRN
  Administered 2022-09-07: 2 mg via INTRAVENOUS

## 2022-09-07 MED ORDER — OXYCODONE HCL 5 MG PO TABS
ORAL_TABLET | ORAL | Status: AC
  Filled 2022-09-07: qty 1

## 2022-09-07 MED ORDER — OXYCODONE HCL 5 MG/5ML PO SOLN: 5.0000 mg | Freq: Once | ORAL | Status: AC | PRN

## 2022-09-07 MED ORDER — PROPOFOL 10 MG/ML IV BOLUS
INTRAVENOUS | Status: DC | PRN
  Administered 2022-09-07: 150 mg via INTRAVENOUS

## 2022-09-07 MED ORDER — LIDOCAINE HCL (PF) 2 % IJ SOLN
INTRAMUSCULAR | Status: AC
  Filled 2022-09-07: qty 5

## 2022-09-07 SURGICAL SUPPLY — 25 items
BAG DRAIN URO-CYSTO SKYTR STRL (DRAIN) ×1 IMPLANT
BAG DRN RND TRDRP ANRFLXCHMBR (UROLOGICAL SUPPLIES) ×1
BAG DRN UROCATH (DRAIN) ×1
BAG URINE DRAIN 2000ML AR STRL (UROLOGICAL SUPPLIES) ×1 IMPLANT
CATH FOLEY 2WAY SLVR  5CC 18FR (CATHETERS) ×1
CATH FOLEY 2WAY SLVR 5CC 18FR (CATHETERS) IMPLANT
CATH URET 5FR 28IN OPEN ENDED (CATHETERS) IMPLANT
CLOTH BEACON ORANGE TIMEOUT ST (SAFETY) ×1 IMPLANT
ELECT REM PT RETURN 9FT ADLT (ELECTROSURGICAL)
ELECTRODE REM PT RTRN 9FT ADLT (ELECTROSURGICAL) IMPLANT
EVACUATOR MICROVAS BLADDER (UROLOGICAL SUPPLIES) IMPLANT
GLOVE BIO SURGEON STRL SZ7.5 (GLOVE) ×1 IMPLANT
GLOVE SURG SS PI 6.5 STRL IVOR (GLOVE) IMPLANT
GOWN STRL REUS W/ TWL LRG LVL3 (GOWN DISPOSABLE) IMPLANT
GOWN STRL REUS W/TWL LRG LVL3 (GOWN DISPOSABLE) ×3 IMPLANT
HIBICLENS CHG 4% 4OZ (MISCELLANEOUS) IMPLANT
HOLDER FOLEY CATH W/STRAP (MISCELLANEOUS) IMPLANT
IV NS IRRIG 3000ML ARTHROMATIC (IV SOLUTION) ×4 IMPLANT
KIT TURNOVER CYSTO (KITS) ×1 IMPLANT
MANIFOLD NEPTUNE II (INSTRUMENTS) ×1 IMPLANT
PACK CYSTO (CUSTOM PROCEDURE TRAY) ×1 IMPLANT
SYR 30ML LL (SYRINGE) IMPLANT
TUBE CONNECTING 12X1/4 (SUCTIONS) IMPLANT
TUBING UROLOGY SET (TUBING) IMPLANT
WATER STERILE IRR 1000ML POUR (IV SOLUTION) IMPLANT

## 2022-09-07 NOTE — H&P (Signed)
f/u for transitional cell carcinoma  HPI: Angel Williamson is a 66 year-old female established patient who is here for surveillance of bladder cancer.  She underwent a TURBT. Her last bladder tumor was resected approximately 10/29/2021. She has had the a total of 2 bladder resections. The patient had their first TURBT in approximately 06/29/2021.   Tumor Pathology: Ta LG NMIBC, 2nd resection was small PUNLMP.   She had treatment with the following intravesical agents: gemcitabine.   Her last cysto was approximately 01/27/2022. His cystoscopy demonstrated NED.   Her last radiologic test to evaluate the kidneys was approximately 06/29/2021. Imaging results: RPG - nml. The patient did not have labs prior to her office visit today.   She is not having pain in new locations. She has not had blood in her urine recently. She has not recently had unwanted weight loss.   Patient has done well over the last 28-monthinterval hematuria. She has had some urgency and frequency.     ALLERGIES: Shellfish    MEDICATIONS: Amlodipine Besylate 2.5 mg tablet  Calcium 1000 Mg  Lorazepam 0.5 mg tablet  Losartan Potassium 25 mg tablet     GU PSH: Cysto Fulgurate < 0.5 cm - 11/09/2021 Cystoscopy - 02/13/2022, 10/10/2021, 06/23/2021 Cystoscopy TURBT 2-5 cm - 07/06/2021     NON-GU PSH: No Non-GU PSH    GU PMH: Bladder Cancer overlapping sites - 02/13/2022 Bladder Cancer Lateral - 11/09/2021, - 10/10/2021, - 07/20/2021, - 06/23/2021 Dorsalgia, Unspec - 07/20/2021    NON-GU PMH: Anxiety Hypertension Sleep Apnea    FAMILY HISTORY: 1 Daughter - Daughter 1 son - Son Diabetes - Grandfather nephrolithiasis - Brother   SOCIAL HISTORY: Marital Status: Married Preferred Language: English; Ethnicity: Not Hispanic Or Latino; Race: White Current Smoking Status: Patient does not smoke anymore. Has not smoked since 05/30/1987. Smoked for 15 years. Smoked 1 pack per day.   Tobacco Use Assessment Completed: Used Tobacco in last  30 days? Has never drank.  Does not drink caffeine. Patient's occupation is/was Retired.    REVIEW OF SYSTEMS:    GU Review Female:   Patient reports frequent urination and hard to postpone urination. Patient denies burning /pain with urination, get up at night to urinate, leakage of urine, stream starts and stops, trouble starting your stream, have to strain to urinate, and being pregnant.  Gastrointestinal (Upper):   Patient denies nausea, vomiting, and indigestion/ heartburn.  Gastrointestinal (Lower):   Patient denies diarrhea and constipation.  Constitutional:   Patient denies fever, night sweats, weight loss, and fatigue.  Skin:   Patient denies skin rash/ lesion and itching.  Eyes:   Patient denies blurred vision and double vision.  Ears/ Nose/ Throat:   Patient denies sore throat and sinus problems.  Hematologic/Lymphatic:   Patient denies swollen glands and easy bruising.  Cardiovascular:   Patient denies leg swelling and chest pains.  Respiratory:   Patient denies cough and shortness of breath.  Endocrine:   Patient denies excessive thirst.  Musculoskeletal:   Patient denies back pain and joint pain.  Neurological:   Patient denies headaches and dizziness.  Psychologic:   Patient denies depression and anxiety.   VITAL SIGNS: None   MULTI-SYSTEM PHYSICAL EXAMINATION:    Constitutional: Well-nourished. No physical deformities. Normally developed. Good grooming.  Respiratory: Normal breath sounds. No labored breathing, no use of accessory muscles.   Cardiovascular: Regular rate and rhythm. No murmur, no gallop. Normal temperature, normal extremity pulses, no swelling, no varicosities.  Complexity of Data:  Source Of History:  Patient  Records Review:   Pathology Reports, Previous Doctor Records, Previous Patient Records, POC Tool  Urine Test Review:   Urinalysis   PROCEDURES:         Flexible Cystoscopy - 52000  Risks, benefits, and some of the potential complications  of the procedure were discussed at length with the patient including infection, bleeding, voiding discomfort, urinary retention, fever, chills, sepsis, and others. All questions were answered. Informed consent was obtained. Antibiotic prophylaxis was given. Sterile technique and intraurethral analgesia were used.  Meatus:  Normal size. Normal location. Normal condition.  Urethra:  No hypermobility. No leakage.  Ureteral Orifices:  Normal location. Normal size. Normal shape. Effluxed clear urine.  Bladder:  5 mm recurrence at the bladder dome. 3 mm recurrence at the trigone. 1 mm recurrence at the right ureteral orifice.      The lower urinary tract was carefully examined. The procedure was well-tolerated and without complications. Antibiotic instructions were given. Instructions were given to call the office immediately for bloody urine, difficulty urinating, urinary retention, painful or frequent urination, fever, chills, nausea, vomiting or other illness. The patient stated that she understood these instructions and would comply with them.         Urinalysis w/Scope Dipstick Dipstick Cont'd Micro  Color: Yellow Bilirubin: Neg mg/dL WBC/hpf: NS (Not Seen)  Appearance: Clear Ketones: Neg mg/dL RBC/hpf: NS (Not Seen)  Specific Gravity: <=1.005 Blood: 1+ ery/uL Bacteria: NS (Not Seen)  pH: <=5.0 Protein: Neg mg/dL Cystals: NS (Not Seen)  Glucose: Neg mg/dL Urobilinogen: 0.2 mg/dL Casts: NS (Not Seen)    Nitrites: Neg Trichomonas: Not Present    Leukocyte Esterase: Neg leu/uL Mucous: Not Present      Epithelial Cells: 6 - 10/hpf      Yeast: NS (Not Seen)      Sperm: Not Present    ASSESSMENT:      ICD-10 Details  1 GU:   Bladder Cancer overlapping sites - C67.8    PLAN:           Document Letter(s):  Created for Patient: Clinical Summary         Notes:    The patient has 3 small areas of recurrence. I recommended that we proceed to the operating room for reresection and postoperative  gemcitabine. I went through that with her in detail. We discussed alternatives to that. I think the best course of action is removing this and doing it in a complete way in the operating room. She agreed. We will plan to get this done in the next few weeks.

## 2022-09-07 NOTE — Transfer of Care (Signed)
Immediate Anesthesia Transfer of Care Note  Patient: Angel Williamson  Procedure(s) Performed: Procedure(s) (LRB): TRANSURETHRAL RESECTION OF BLADDER TUMOR BIOPSY, BILATERAL RETROGRADE PYELOGRAMS (Bilateral)  Patient Location: PACU  Anesthesia Type: General  Level of Consciousness: awake, sedated, patient cooperative and responds to stimulation  Airway & Oxygen Therapy: Patient Spontanous Breathing and Patient connected to [place] oxygen  Post-op Assessment: Report given to PACU RN, Post -op Vital signs reviewed and stable and Patient moving all extremities  Post vital signs: Reviewed and stable  Complications: No apparent anesthesia complications

## 2022-09-07 NOTE — Anesthesia Postprocedure Evaluation (Signed)
Anesthesia Post Note  Patient: ISATU MACINNES  Procedure(s) Performed: TRANSURETHRAL RESECTION OF BLADDER TUMOR BIOPSY, BILATERAL RETROGRADE PYELOGRAMS (Bilateral: Bladder)     Patient location during evaluation: PACU Anesthesia Type: General Level of consciousness: awake and alert and oriented Pain management: pain level controlled Vital Signs Assessment: post-procedure vital signs reviewed and stable Respiratory status: spontaneous breathing, nonlabored ventilation and respiratory function stable Cardiovascular status: blood pressure returned to baseline and stable Postop Assessment: no apparent nausea or vomiting Anesthetic complications: no   No notable events documented.  Last Vitals:  Vitals:   09/07/22 0912 09/07/22 0915  BP:  131/86  Pulse: 65 69  Resp: 14 13  Temp:    SpO2: 96% 98%    Last Pain:  Vitals:   09/07/22 0547  TempSrc: Oral  PainSc: 2                  Kryslyn Helbig A.

## 2022-09-07 NOTE — Anesthesia Procedure Notes (Signed)
Procedure Name: LMA Insertion Date/Time: 09/07/2022 7:45 AM  Performed by: Justice Rocher, CRNAPre-anesthesia Checklist: Patient identified, Emergency Drugs available, Suction available, Patient being monitored and Timeout performed Patient Re-evaluated:Patient Re-evaluated prior to induction Oxygen Delivery Method: Circle system utilized Preoxygenation: Pre-oxygenation with 100% oxygen Induction Type: IV induction Ventilation: Mask ventilation without difficulty LMA: LMA inserted LMA Size: 4.0 Number of attempts: 1 Airway Equipment and Method: Bite block Placement Confirmation: positive ETCO2, breath sounds checked- equal and bilateral and CO2 detector Tube secured with: Tape Dental Injury: Teeth and Oropharynx as per pre-operative assessment

## 2022-09-07 NOTE — Interval H&P Note (Signed)
History and Physical Interval Note:  09/07/2022 7:24 AM  Angel Williamson  has presented today for surgery, with the diagnosis of RECURRENT BLADDER CANCER.  The various methods of treatment have been discussed with the patient and family. After consideration of risks, benefits and other options for treatment, the patient has consented to  Procedure(s): TRANSURETHRAL RESECTION OF BLADDER TUMOR BIOPSY WITH GEMCITABINE, BILATERAL RETROGRADE PYELOGRAMS (Bilateral) as a surgical intervention.  The patient's history has been reviewed, patient examined, no change in status, stable for surgery.  I have reviewed the patient's chart and labs.  Questions were answered to the patient's satisfaction.     Ardis Hughs

## 2022-09-07 NOTE — Op Note (Signed)
Preoperative diagnosis:  Recurrent bladder cancer  Postoperative diagnosis:  Same  Procedure: TURBT 0.5 to 1 cm in aggregate, bilateral retrograde pyelograms with interpretation  Surgeon: Ardis Hughs, MD  Anesthesia: General  Complications:  During the resection of the trigonal lesion the loop element was passed to deeply through her bladder wall and noted to be into the muscles of the bladder.  As such, I did not feel comfortable giving her the postoperative intravesical chemotherapy.  And, I will leave a Foley catheter for 3 days.  Intraoperative findings:  #1: The patient had 4 small lesions.  One of them was at the bladder dome, this is when measured 3 or 4 mm in size.  It was on a small narrow stalk.  There was a second 1 in the posterior wall of the bladder that was 2 or 3 mm in size as well.  The third 1 was located at the trigone in the middle, this was 3 or 4 mm.  And finally the fourth 1 was at the right ureteral orifice, this 1 was 2 or 3 mm in size. #2: The patient's right retrograde pyelogram was performed with 6 cc of Omnipaque contrast demonstrating normal caliber ureter with no filling defects or abnormalities.  There is no hydroureteronephrosis. #3: The patient's left retrograde pyelogram was performed with 60 cc of Omnipaque contrast and demonstrated normal caliber ureter with no filling defects or abnormalities.  There is no hydronephrosis.  EBL: Minimal  Specimens: Bladder tumors  Indication: TYSHIKA BALDRIDGE is a 66 y.o. patient with history of low-grade bladder cancer, found to have recurrence on surveillance cystoscopy.  After reviewing the management options for treatment, he elected to proceed with the above surgical procedure(s). We have discussed the potential benefits and risks of the procedure, side effects of the proposed treatment, the likelihood of the patient achieving the goals of the procedure, and any potential problems that might occur during the  procedure or recuperation. Informed consent has been obtained.  Description of procedure:  The patient was taken to the operating room and general anesthesia was induced.  The patient was placed in the dorsal lithotomy position, prepped and draped in the usual sterile fashion, and preoperative antibiotics were administered. A preoperative time-out was performed.   21 French 30 cystoscope was gently passed through the patient's urethra and the bladder under visual guidance.  The findings were noted as above.  I then performed retrograde pyelograms as noted above.  I then removed the 21 French cystoscope for the 26 French resectoscope sheath using the visual obturator.  Using the loop element I resected the dome.  This resection was fairly deep into the bladder muscle.  I fulgurated off the lesion on the posterior wall.  I then resected the area at the trigone.  At that moment of resection the patient had a deep inspiration and as a result the bladder wall moved into the scope and the element went deep into the bladder wall.  Further inspection demonstrated that this had in fact gone deep into the bladder muscle.  I fulgurated the area and removed the lesion.  I then fulgurated the area at the right ureteral orifice.  Given that the patient had a area at the trigone that was quite deep and through the bladder wall I opted not to give her postoperative chemotherapy and to leave a Foley catheter for the coming days.  Ardis Hughs, M.D.

## 2022-09-07 NOTE — Discharge Instructions (Addendum)
DISCHARGE INSTRUCTIONS FOR PROSTATE SEED IMPLANTATION  Removal of catheter Remove the foley catheter Monday, November 13th.  This can be done easily by cutting the side port of the catheter, whichallow the balloon to deflate.  You will see 1-2 teaspoons of clear water as the balloon deflates and then the catheter can be slid out without difficulty.        Cut here   Foley Catheter Care A soft, flexible tube (Foley catheter) may have been placed in your bladder to drain urine and fluid. Follow these instructions: Taking Care of the Catheter Keep the area where the catheter leaves your body clean.  Attach the catheter to the leg so there is no tension on the catheter.  Keep the drainage bag below the level of the bladder, but keep it OFF the floor.  Do not take long soaking baths. Your caregiver will give instructions about showering.  Wash your hands before touching ANYTHING related to the catheter or bag.  Using mild soap and warm water on a washcloth:  Clean the area closest to the catheter insertion site using a circular motion around the catheter.  Clean the catheter itself by wiping AWAY from the insertion site for several inches down the tube.  NEVER wipe upward as this could sweep bacteria up into the urethra (tube in your body that normally drains the bladder) and cause infection.  Place a small amount of sterile lubricant at the tip of the penis where the catheter is entering.  Taking Care of the Drainage Bags Two drainage bags may be taken home: a large overnight drainage bag, and a smaller leg bag which fits underneath clothing.  It is okay to wear the overnight bag at any time, but NEVER wear the smaller leg bag at night.  Keep the drainage bag well below the level of your bladder. This prevents backflow of urine into the bladder and allows the urine to drain freely.  Anchor the tubing to your leg to prevent pulling or tension on the catheter. Use tape or a leg strap provided by  the hospital.  Empty the drainage bag when it is 1/2 to 3/4 full. Wash your hands before and after touching the bag.  Periodically check the tubing for kinks to make sure there is no pressure on the tubing which could restrict the flow of urine.  Changing the Drainage Bags Cleanse both ends of the clean bag with alcohol before changing.  Pinch off the rubber catheter to avoid urine spillage during the disconnection.  Disconnect the dirty bag and connect the clean one.  Empty the dirty bag carefully to avoid a urine spill.  Attach the new bag to the leg with tape or a leg strap.  Cleaning the Drainage Bags Whenever a drainage bag is disconnected, it must be cleaned quickly so it is ready for the next use.  Wash the bag in warm, soapy water.  Rinse the bag thoroughly with warm water.  Soak the bag for 30 minutes in a solution of white vinegar and water (1 cup vinegar to 1 quart warm water).  Rinse with warm water.  SEEK MEDICAL CARE IF:  You have chills or night sweats.  You are leaking around your catheter or have problems with your catheter. It is not uncommon to have sporadic leakage around your catheter as a result of bladder spasms. If the leakage stops, there is not much need for concern. If you are uncertain, call your caregiver.  You develop side  effects that you think are coming from your medicines.  SEEK IMMEDIATE MEDICAL CARE IF:  You are suddenly unable to urinate. Check to see if there are any kinks in the drainage tubing that may cause this. If you cannot find any kinks, call your caregiver immediately. This is an emergency.  You develop shortness of breath or chest pains.  Bleeding persists or clots develop in your urine.  You have a fever.  You develop pain in your back or over your lower belly (abdomen).  You develop pain or swelling in your legs.  Any problems you are having get worse rather than better.  MAKE SURE YOU:  Understand these instructions.  Will watch your  condition.  Will get help right away if you are not doing well or get worse.     Transurethral Resection of Bladder Tumor (TURBT) or Bladder Biopsy   Definition:  Transurethral Resection of the Bladder Tumor is a surgical procedure used to diagnose and remove tumors within the bladder. TURBT is the most common treatment for early stage bladder cancer.  General instructions:     Your recent bladder surgery requires very little post hospital care but some definite precautions.  Despite the fact that no skin incisions were used, the area around the bladder incisions are raw and covered with scabs to promote healing and prevent bleeding. Certain precautions are needed to insure that the scabs are not disturbed over the next 2-4 weeks while the healing proceeds.  Because the raw surface inside your bladder and the irritating effects of urine you may expect frequency of urination and/or urgency (a stronger desire to urinate) and perhaps even getting up at night more often. This will usually resolve or improve slowly over the healing period. You may see some blood in your urine over the first 6 weeks. Do not be alarmed, even if the urine was clear for a while. Get off your feet and drink lots of fluids until clearing occurs. If you start to pass clots or don't improve call us.  Diet:  You may return to your normal diet immediately. Because of the raw surface of your bladder, alcohol, spicy foods, foods high in acid and drinks with caffeine may cause irritation or frequency and should be used in moderation. To keep your urine flowing freely and avoid constipation, drink plenty of fluids during the day (8-10 glasses). Tip: Avoid cranberry juice because it is very acidic.  Activity:  Your physical activity doesn't need to be restricted. However, if you are very active, you may see some blood in the urine. We suggest that you reduce your activity under the circumstances until the bleeding has  stopped.  Bowels:  It is important to keep your bowels regular during the postoperative period. Straining with bowel movements can cause bleeding. A bowel movement every other day is reasonable. Use a mild laxative if needed, such as milk of magnesia 2-3 tablespoons, or 2 Dulcolax tablets. Call if you continue to have problems. If you had been taking narcotics for pain, before, during or after your surgery, you may be constipated. Take a laxative if necessary.    Medication:  You should resume your pre-surgery medications unless told not to. In addition you may be given an antibiotic to prevent or treat infection. Antibiotics are not always necessary. All medication should be taken as prescribed until the bottles are finished unless you are having an unusual reaction to one of the drugs.      Post Anesthesia  Home Care Instructions  Activity: Get plenty of rest for the remainder of the day. A responsible adult should stay with you for 24 hours following the procedure.  For the next 24 hours, DO NOT: -Drive a car -Paediatric nurse -Drink alcoholic beverages -Take any medication unless instructed by your physician -Make any legal decisions or sign important papers.  Meals: Start with liquid foods such as gelatin or soup. Progress to regular foods as tolerated. Avoid greasy, spicy, heavy foods. If nausea and/or vomiting occur, drink only clear liquids until the nausea and/or vomiting subsides. Call your physician if vomiting continues.  Special Instructions/Symptoms: Your throat may feel dry or sore from the anesthesia or the breathing tube placed in your throat during surgery. If this causes discomfort, gargle with warm salt water. The discomfort should disappear within 24 hours.

## 2022-09-10 LAB — SURGICAL PATHOLOGY

## 2022-09-11 ENCOUNTER — Encounter (HOSPITAL_BASED_OUTPATIENT_CLINIC_OR_DEPARTMENT_OTHER): Payer: Self-pay | Admitting: Urology

## 2022-09-13 ENCOUNTER — Ambulatory Visit
Admission: RE | Admit: 2022-09-13 | Discharge: 2022-09-13 | Disposition: A | Discharge status: Home / Self Care | Payer: Medicare HMO | Source: Ambulatory Visit | Attending: Internal Medicine | Admitting: Internal Medicine

## 2022-09-13 DIAGNOSIS — Z1231 Encounter for screening mammogram for malignant neoplasm of breast: Secondary | ICD-10-CM | POA: Diagnosis not present

## 2022-09-19 ENCOUNTER — Other Ambulatory Visit: Payer: Self-pay | Admitting: Allergy

## 2022-09-24 DIAGNOSIS — C672 Malignant neoplasm of lateral wall of bladder: Secondary | ICD-10-CM | POA: Diagnosis not present

## 2022-09-30 ENCOUNTER — Encounter: Payer: Self-pay | Admitting: Internal Medicine

## 2022-09-30 NOTE — Progress Notes (Unsigned)
    Subjective:    Patient ID: Angel Williamson, female    DOB: May 10, 1956, 66 y.o.   MRN: [id number]      HPI K[facility] is here for No chief complaint on file.   H/o TCC of bladder - s/p 3 TURBT, gemcitabine.     Pain in side -      Medications and allergies reviewed with patient and updated if appropriate.  Current Outpatient Medications on File Prior to Visit  Medication Sig Dispense Refill   Calcium Carbonate (CALCIUM 500 PO) Take 1,000 mg by mouth daily.     Cholecalciferol (D3 ADULT PO) Take 2,000 Units by mouth daily.     EPINEPHrine 0.3 mg/0.3 mL IJ SOAJ injection Use as directed for life-threatening allergic reaction. 2 each 3   fluticasone (FLONASE) 50 MCG/ACT nasal spray Place 1 spray into both nostrils at bedtime. 16 g 2   LORazepam (ATIVAN) 0.5 MG tablet Take 1 tablet (0.5 mg total) by mouth every 8 (eight) hours as needed for anxiety. 30 tablet 0   losartan (COZAAR) 25 MG tablet TAKE 1 TABLET (25 MG TOTAL) BY MOUTH DAILY. 90 tablet 1   phenazopyridine (PYRIDIUM) 200 MG tablet Take 1 tablet (200 mg total) by mouth 3 (three) times daily as needed for pain. 10 tablet 0   sulfamethoxazole-trimethoprim (BACTRIM DS) 800-160 MG tablet Take 1 tablet by mouth 2 (two) times daily. 6 tablet 0   traMADol (ULTRAM) 50 MG tablet Take 1-2 tablets (50-100 mg total) by mouth every 6 (six) hours as needed for moderate pain. 15 tablet 0   No current facility-administered medications on file prior to visit.    Review of Systems     Objective:  There were no vitals filed for this visit. BP Readings from Last 3 Encounters:  09/07/22 124/85  02/05/22 (!) 142/76  01/16/22 130/74   Wt Readings from Last 3 Encounters:  09/07/22 192 lb 8 oz (87.3 kg)  02/05/22 187 lb 8 oz (85 kg)  01/16/22 187 lb 6.4 oz (85 kg)   There is no height or weight on file to calculate BMI.    Physical Exam         Assessment & Plan:    See Problem List for Assessment and Plan of chronic medical  problems.

## 2022-10-01 ENCOUNTER — Ambulatory Visit (INDEPENDENT_AMBULATORY_CARE_PROVIDER_SITE_OTHER): Payer: Medicare HMO | Admitting: Internal Medicine

## 2022-10-01 VITALS — BP 130/84 | HR 88 | Temp 98.4°F | Ht 63.0 in | Wt 190.0 lb

## 2022-10-01 DIAGNOSIS — R109 Unspecified abdominal pain: Secondary | ICD-10-CM

## 2022-10-01 DIAGNOSIS — I10 Essential (primary) hypertension: Secondary | ICD-10-CM

## 2022-10-01 MED ORDER — LORAZEPAM 0.5 MG PO TABS: 0.5000 mg | ORAL_TABLET | Freq: Three times a day (TID) | ORAL | 2 refills | Status: DC | PRN

## 2022-10-01 NOTE — Assessment & Plan Note (Signed)
Acute Pain started about 3 weeks ago Pain has gotten a little bit better Dull pain that is intermittent-no pain with laying.  She does feel pain with standing and certain movements Has been taking Tylenol which helps some No concerning GI or GU symptoms Pain sounds musculoskeletal in nature-may be one of the deeper muscles that is causing pain She will continue Tylenol Can try heat Would prefer not to take anything else stronger for the pain If pain is not improving can see sports medicine-there may be some exercises or stretches she can do

## 2022-10-01 NOTE — Patient Instructions (Addendum)
? ? ? ?  ? ? ?  Medications changes include :  none  ? ? ? ? ? ?Return if symptoms worsen or fail to improve. ? ?

## 2022-10-01 NOTE — Assessment & Plan Note (Signed)
Chronic Blood pressure well-controlled Continue losartan 25 mg daily

## 2022-10-03 ENCOUNTER — Other Ambulatory Visit: Payer: Medicare HMO

## 2022-10-03 DIAGNOSIS — R519 Headache, unspecified: Secondary | ICD-10-CM | POA: Diagnosis not present

## 2022-10-03 DIAGNOSIS — R0981 Nasal congestion: Secondary | ICD-10-CM | POA: Diagnosis not present

## 2022-10-03 DIAGNOSIS — J324 Chronic pansinusitis: Secondary | ICD-10-CM | POA: Diagnosis not present

## 2022-10-03 DIAGNOSIS — R051 Acute cough: Secondary | ICD-10-CM | POA: Diagnosis not present

## 2022-10-08 ENCOUNTER — Ambulatory Visit: Payer: Medicare HMO | Admitting: Internal Medicine

## 2022-10-09 ENCOUNTER — Other Ambulatory Visit: Payer: Medicare HMO

## 2022-10-16 IMAGING — MG MM DIGITAL SCREENING BILAT W/ TOMO AND CAD
8 series · 8 of 24 positions shown · non-contrast
Comparison: Previous exam(s).

CLINICAL DATA: Screening.

EXAM:
DIGITAL SCREENING BILATERAL MAMMOGRAM WITH TOMOSYNTHESIS AND CAD
TECHNIQUE: Bilateral screening digital craniocaudal and mediolateral oblique
mammograms were obtained. Bilateral screening digital breast
tomosynthesis was performed. The images were evaluated with
computer-aided detection.

[R CC synth-2D]
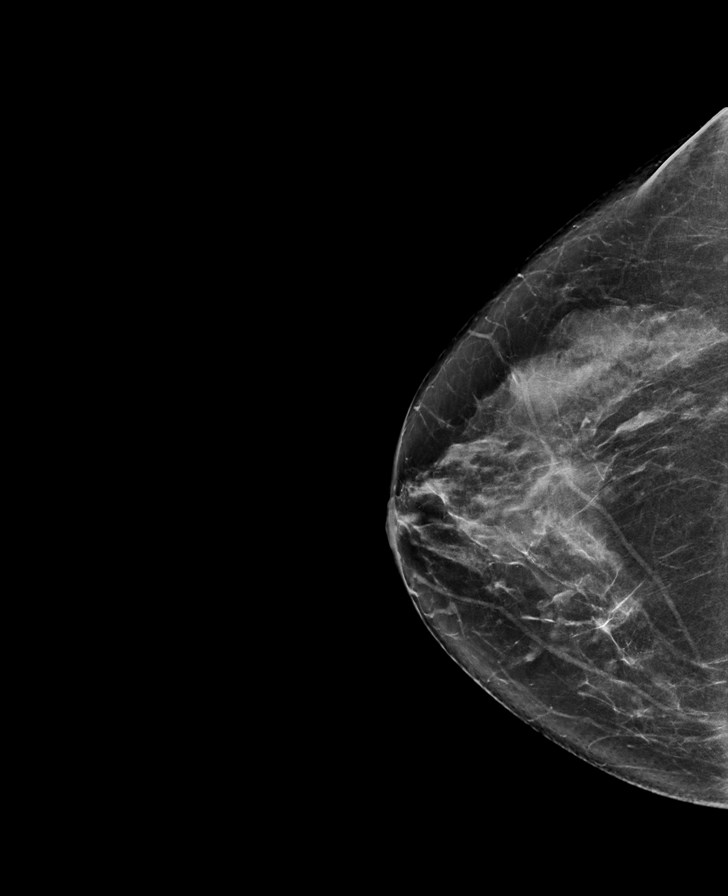

[R MLO synth-2D]
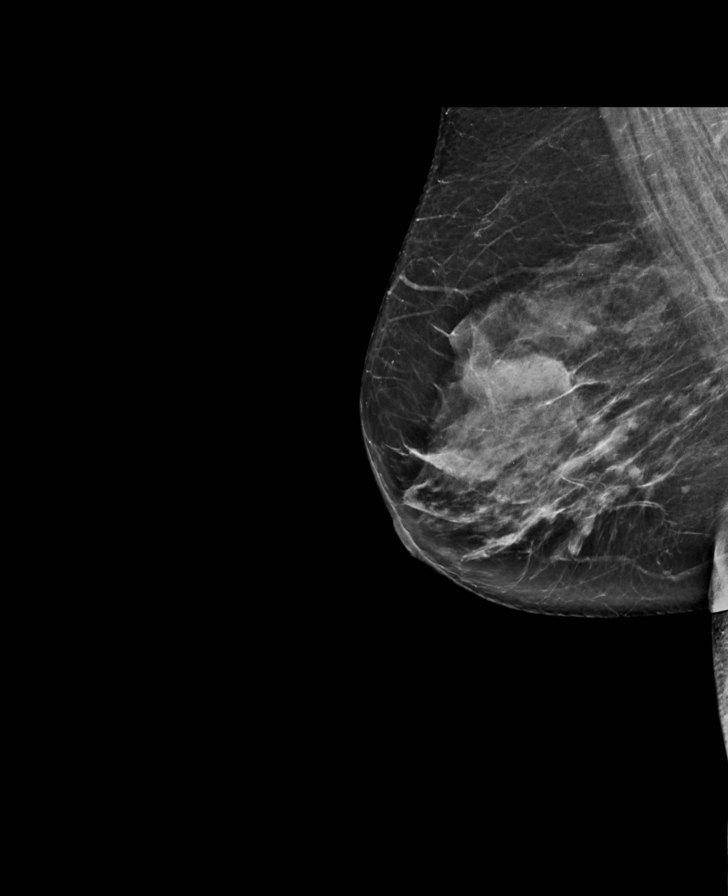

[L MLO synth-2D]
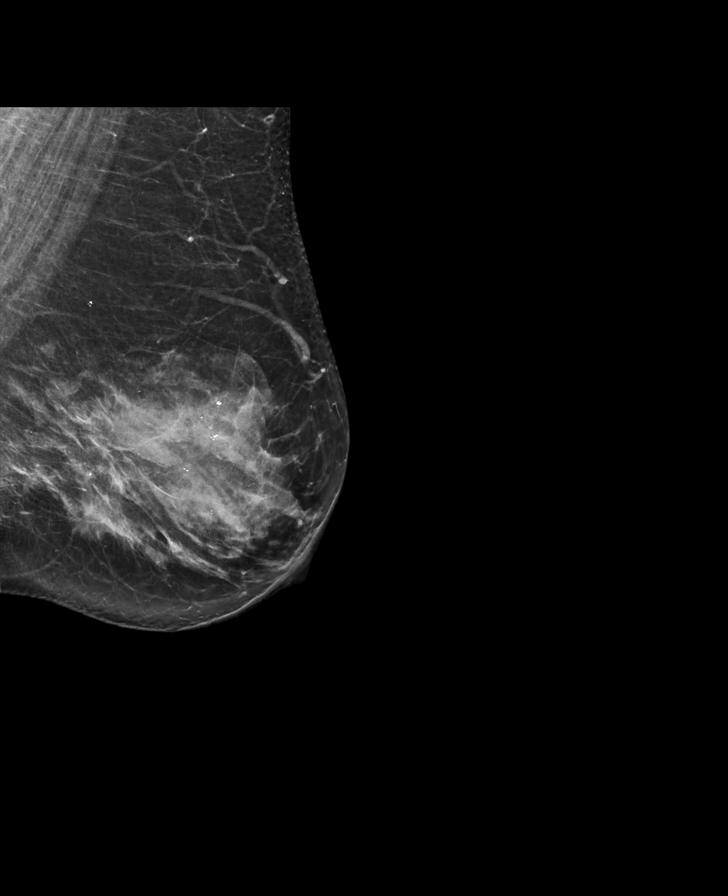

[L CC synth-2D]
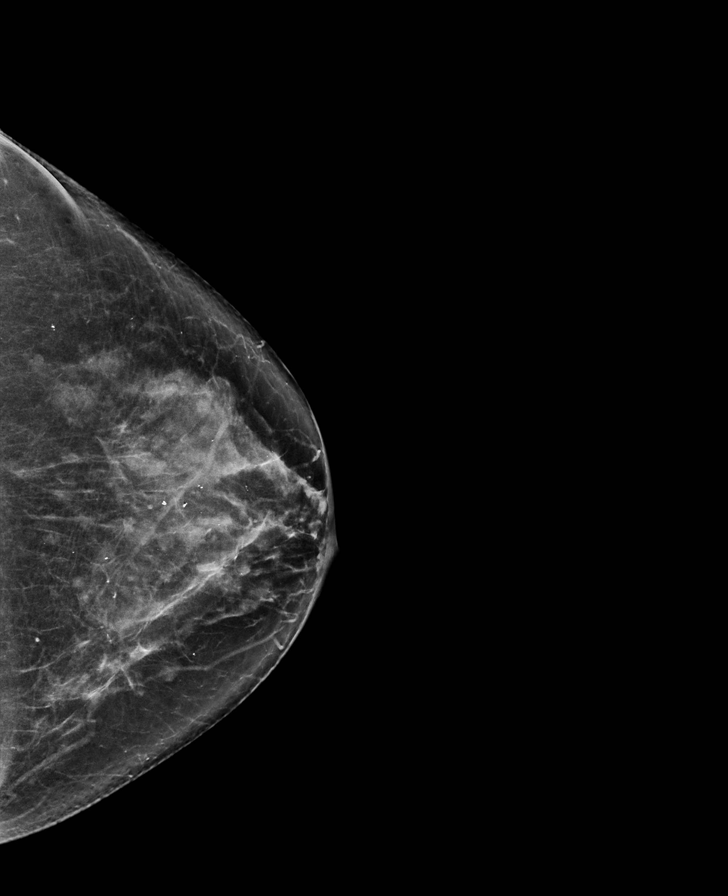

[L MLO tomo · tomo slice 39/77.0]
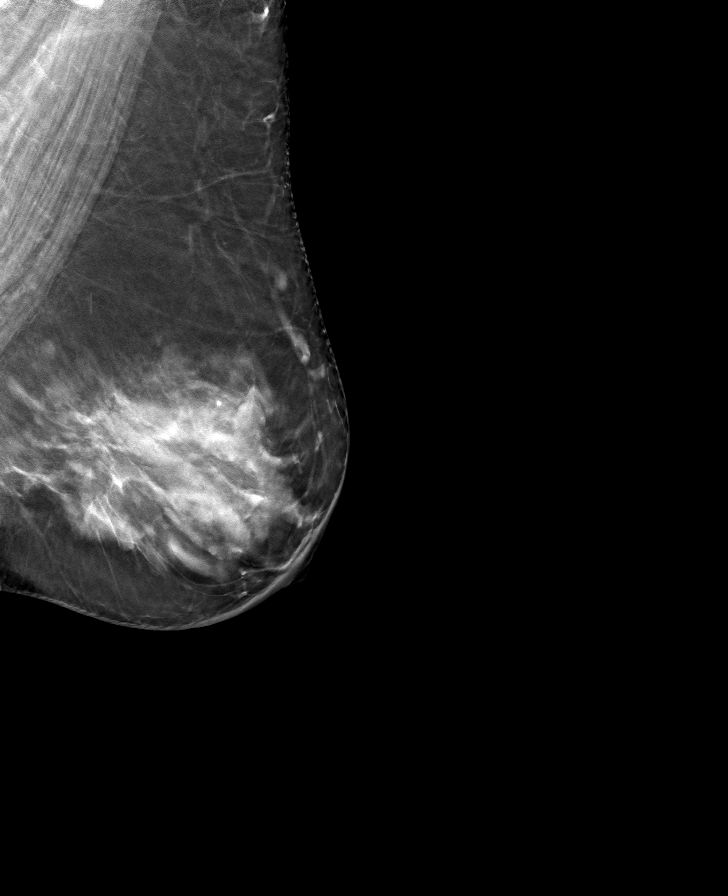

[L CC tomo · tomo slice 36/71.0]
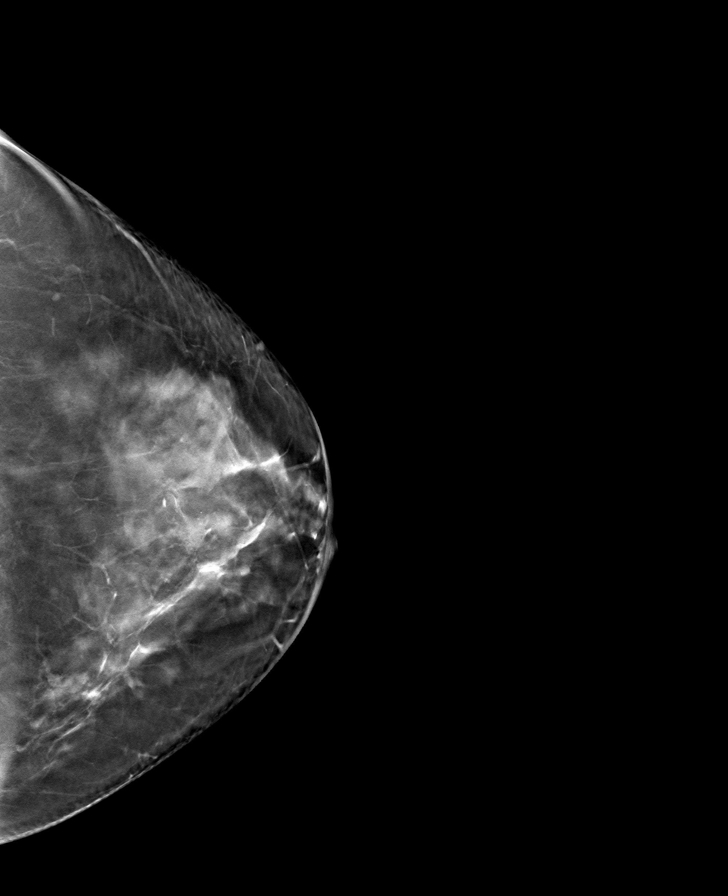

[R CC tomo · tomo slice 37/72.0]
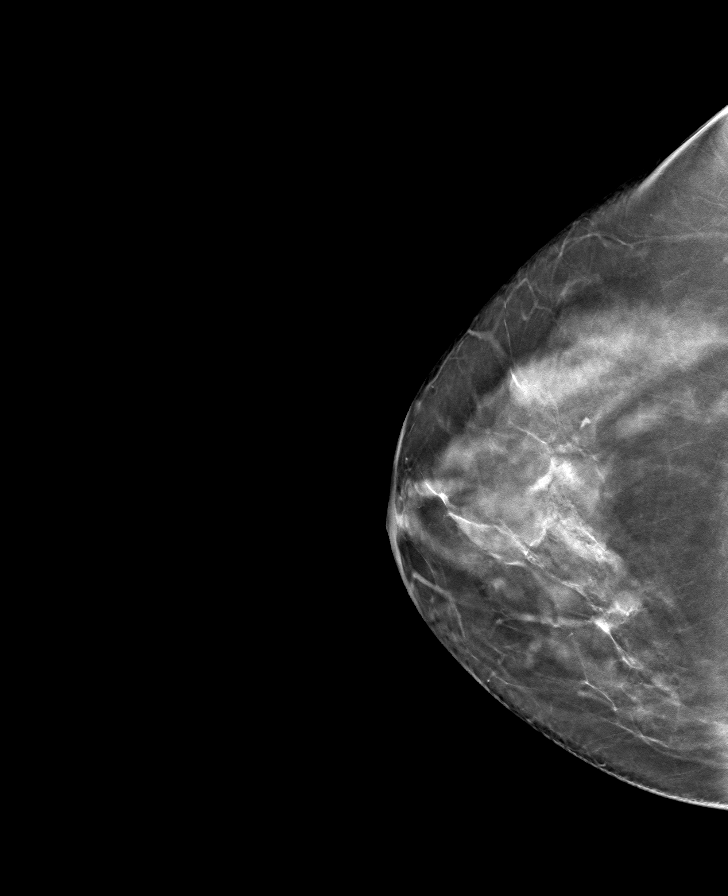

[R MLO tomo · tomo slice 39/78.0]
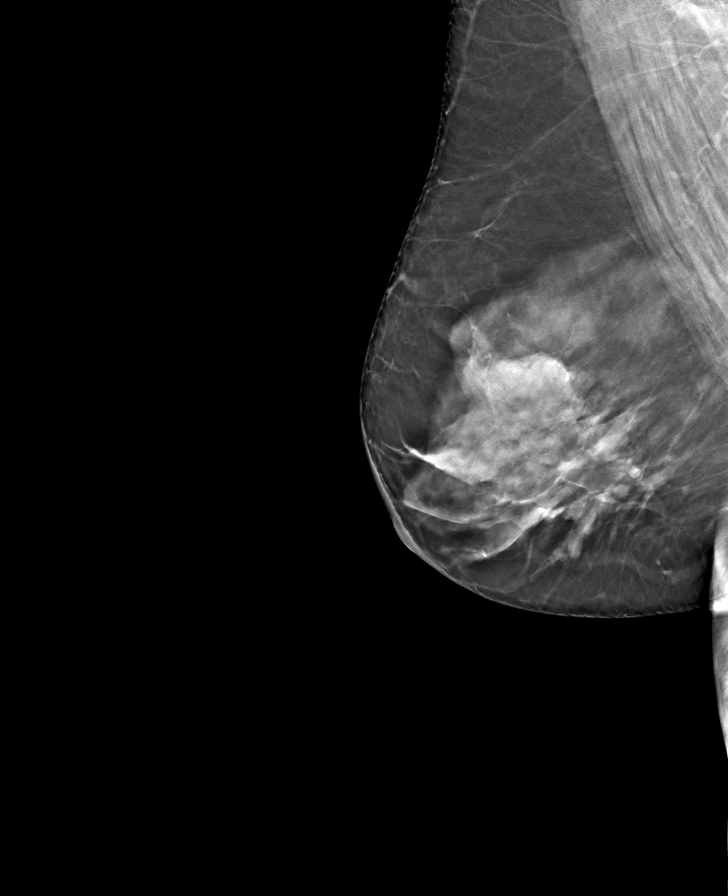

[8 of 24 positions shown; findings below may reference images not displayed]

ACR Breast Density Category c: The breast tissue is heterogeneously
dense, which may obscure small masses.
FINDINGS: There are no findings suspicious for malignancy. The images were
evaluated with computer-aided detection.
IMPRESSION: No mammographic evidence of malignancy. A result letter of this
screening mammogram will be mailed directly to the patient.

RECOMMENDATION:
Screening mammogram in one year. (Code:T4-5-GWO)

BI-RADS CATEGORY  1: Negative.

## 2022-10-23 DIAGNOSIS — N3000 Acute cystitis without hematuria: Secondary | ICD-10-CM | POA: Diagnosis not present

## 2022-11-08 ENCOUNTER — Ambulatory Visit
Admission: RE | Admit: 2022-11-08 | Discharge: 2022-11-08 | Disposition: A | Discharge status: Home / Self Care | Payer: Medicare HMO | Source: Ambulatory Visit | Attending: Adult Health | Admitting: Adult Health

## 2022-11-08 DIAGNOSIS — R918 Other nonspecific abnormal finding of lung field: Secondary | ICD-10-CM

## 2022-11-08 DIAGNOSIS — I700 Atherosclerosis of aorta: Secondary | ICD-10-CM | POA: Diagnosis not present

## 2022-11-08 DIAGNOSIS — R911 Solitary pulmonary nodule: Secondary | ICD-10-CM | POA: Diagnosis not present

## 2022-11-08 DIAGNOSIS — J479 Bronchiectasis, uncomplicated: Secondary | ICD-10-CM | POA: Diagnosis not present

## 2022-11-13 NOTE — Progress Notes (Signed)
Called and spoke with patient, advised of results/recommendations per Dr. Halford Chessman.  She verbalized understanding.  Will order f/u CT scan for 1 year.  Nothing further needed.

## 2022-12-25 ENCOUNTER — Other Ambulatory Visit: Payer: Self-pay | Admitting: Internal Medicine

## 2022-12-26 ENCOUNTER — Telehealth: Payer: Self-pay | Admitting: Internal Medicine

## 2022-12-26 ENCOUNTER — Other Ambulatory Visit: Payer: Self-pay

## 2022-12-26 MED ORDER — LOSARTAN POTASSIUM 25 MG PO TABS: 25.0000 mg | ORAL_TABLET | Freq: Every day | ORAL | 1 refills | Status: DC

## 2022-12-26 NOTE — Telephone Encounter (Signed)
MEDICATION:losartan (COZAAR) 25 MG tablet  PHARMACY:[id number]- [place], [place] - [address]   Comments: Patient will run out of medication before appointment on 02/12/23  **Let patient know to contact pharmacy at the end of the day to make sure medication is ready. **  ** Please notify patient to allow 48-72 hours to process**  **Encourage patient to contact the pharmacy for refills or they can [facility]*

## 2022-12-26 NOTE — Telephone Encounter (Signed)
Script sent in today for patient.

## 2022-12-27 DIAGNOSIS — C678 Malignant neoplasm of overlapping sites of bladder: Secondary | ICD-10-CM | POA: Diagnosis not present

## 2022-12-31 ENCOUNTER — Other Ambulatory Visit: Payer: Self-pay | Admitting: Urology

## 2023-01-03 ENCOUNTER — Encounter (HOSPITAL_BASED_OUTPATIENT_CLINIC_OR_DEPARTMENT_OTHER): Payer: Self-pay | Admitting: Pulmonary Disease

## 2023-01-03 ENCOUNTER — Ambulatory Visit (HOSPITAL_BASED_OUTPATIENT_CLINIC_OR_DEPARTMENT_OTHER): Payer: Medicare HMO | Admitting: Pulmonary Disease

## 2023-01-03 VITALS — BP 124/72 | HR 65 | Temp 98.4°F | Ht 63.0 in | Wt 191.2 lb

## 2023-01-03 DIAGNOSIS — J479 Bronchiectasis, uncomplicated: Secondary | ICD-10-CM | POA: Diagnosis not present

## 2023-01-03 DIAGNOSIS — R911 Solitary pulmonary nodule: Secondary | ICD-10-CM | POA: Diagnosis not present

## 2023-01-03 DIAGNOSIS — G4733 Obstructive sleep apnea (adult) (pediatric): Secondary | ICD-10-CM | POA: Diagnosis not present

## 2023-01-03 DIAGNOSIS — R918 Other nonspecific abnormal finding of lung field: Secondary | ICD-10-CM

## 2023-01-03 NOTE — Patient Instructions (Signed)
Will schedule CT chest in January 2025 and arrange for follow up after this is done

## 2023-01-03 NOTE — Progress Notes (Signed)
[facility]  Chief Complaint  Patient presents with   Follow-up    Pt states she has been doing okay since last visit. States at times when she wakes up in the mornings, she wakes up feeling nervous.    Past Surgical History:  She  has a past surgical history that includes Colonoscopy (2013); No past surgeries; Transurethral resection of bladder tumor with mitomycin-c (Bilateral, 07/06/2021); and Transurethral resection of bladder tumor with mitomycin-c (Bilateral, 09/07/2022).  Past Medical History:  HTN, Bladder cancer, Colon polyp, COVID 16 April 2021, Osteopenia, Varicose veins  Constitutional:  BP 124/72 (BP Location: Left Arm, Patient Position: Sitting, Cuff Size: Large)   Pulse 65   Temp 98.4 F (36.9 C) (Oral)   Ht '5\' 3"'$  (1.6 m)   Wt 191 lb 3.2 oz (86.7 kg)   SpO2 99% Comment: RA  BMI 33.87 kg/m   Brief Summary:  Angel Williamson is a 67 y.o. female former smoker with obstructive sleep apnea and lung nodules.      Subjective:   CT chest from January showed stable RLL nodule, and regional bronchiectasis.  Not having cough, fever, sputum, chest pain or hemoptysis.  She goes to bed at 11 pm.  She falls sleep for about 2 or 3 hours, and then wakes up.  She then puts her CPAP on for 4 to 5 hours.  She takes the mask off again, and goes back to sleep.  She gets out of bed around 8 am.  She gets episodes of choking and feeling anxious when she doesn't use CPAP.  She feels this happens more when she is dreaming.  Physical Exam:   Appearance - well kempt   ENMT - no sinus tenderness, no oral exudate, no LAN, Mallampati 3 airway, no stridor  Respiratory - equal breath sounds bilaterally, no wheezing or rales  CV - s1s2 regular rate and rhythm, no murmurs  Ext - no clubbing, no edema  Skin - no rashes  Psych - normal mood and affect    Chest Imaging:  CT chest 10/21/21 >> calcified granuloma LLL, 8 mm RLL GGO stable CT chest  11/08/22 >> granuloma LLL, scattered areas of scar and fibrotic changes, BTX RUL and LUL, no change 8 mm GGO  Sleep Tests:  HST 06/04/16 >> AHI 46.8, SaO2 low 72% Auto CPAP 09/09/22 to 11/07/22 >> used on 57 of 60 nights with average 4 hrs 24 min.  Average AHI 0.6 with median CPAP 6 and 95 th percentile CPAP 7 cm H2O  Social History:  She  reports that she quit smoking about 36 years ago. Her smoking use included cigarettes. She has a 15.00 pack-year smoking history. She has never used smokeless tobacco. She reports that she does not drink alcohol and does not use drugs.  Family History:  Her family history includes Anemia in her mother and another family member; Asthma in her son; Cancer in her paternal grandmother; Colon polyps in her father; Diabetes in her maternal grandfather; Stomach cancer in her paternal grandmother; Stroke (age of onset: 66) in her paternal grandfather.     Assessment/Plan:   Obstructive sleep apnea. - she is compliant with CPAP and reports benefit from therapy - she uses [employer] for her DME - current CPAP ordered April 2022 - discussed options to assist with mask fit - continue auto CPAP 5 to 15 cm H2O - advised her to use CPAP for the entire time she is asleep to  get maximum benefit  CPAP rhinitis. - prn nasal irrigation and flonase at night  Lung nodule with history of tobacco abuse and history of bladder cancer. - follow up scan in January 2025  Regional bronchiectasis with granulomas. - likely from prior respiratory infection - no significant respiratory symptoms - monitor clinically  Time Spent Involved in Patient Care on Day of Examination:  37 minutes  Follow up:   Patient Instructions  Will schedule CT chest in January 2025 and arrange for follow up after this is done  Medication List:   Allergies as of 01/03/2023       Reactions   Hydrochlorothiazide    REACTION: low potassium   Amlodipine    Face flushing, body tingling   Losartan  Potassium    Muscle Weakness   Shellfish Allergy Hives   All shellfish   Spironolactone Other (See Comments)   Muscle Weakness, Breast Swelling   Benazepril Hcl    REACTION: cough        Medication List        Accurate as of January 03, 2023 11:17 AM. If you have any questions, ask your nurse or doctor.          CALCIUM 500 PO Take 1,000 mg by mouth daily.   D3 ADULT PO Take 2,000 Units by mouth daily.   EPINEPHrine 0.3 mg/0.3 mL Soaj injection Commonly known as: EPI-PEN Use as directed for life-threatening allergic reaction.   fluticasone 50 MCG/ACT nasal spray Commonly known as: FLONASE Place 1 spray into both nostrils at bedtime.   LORazepam 0.5 MG tablet Commonly known as: ATIVAN Take 1 tablet (0.5 mg total) by mouth every 8 (eight) hours as needed for anxiety.   losartan 25 MG tablet Commonly known as: COZAAR Take 1 tablet (25 mg total) by mouth daily.        Signature:  Chesley Mires, MD [place] Pager - [phone] 01/03/2023, 11:17 AM

## 2023-01-04 MED ORDER — GEMCITABINE CHEMO FOR BLADDER INSTILLATION 2000 MG: 2000.0000 mg | Freq: Once | INTRAVENOUS | Status: DC

## 2023-01-07 ENCOUNTER — Encounter (HOSPITAL_BASED_OUTPATIENT_CLINIC_OR_DEPARTMENT_OTHER): Payer: Self-pay | Admitting: Urology

## 2023-01-07 NOTE — Progress Notes (Signed)
01/07/2023 3:10 PM Spoke w/ via phone for pre-op interview---patient Lab needs dos----   I Stat Chem 8 Lab results------EKG 09/07/22 in EPIC, labs 02/05/22 (cmp, cbc, lipids, A1c) COVID test -----patient states asymptomatic no test needed Arrive at -------1000 NPO after MN NO Solid Food.  Clear liquids from MN until---0800 Med rec completed Medications to take morning of surgery -----none, hold losartan dos Diabetic medication -----na Patient instructed no nail polish to be worn day of surgery Patient instructed to bring photo id and insurance card day of surgery Patient aware to have Driver (ride ) / caregiver    for 24 hours after surgery (husband-Clayton [place]) Patient Special Instructions -----bring cpap machine Pre-Op special Istructions -----na Patient verbalized understanding of instructions that were given at this phone interview. Patient denies shortness of breath, chest pain, fever, cough at this phone interview.  Angel Williamson, Arville Lime

## 2023-01-11 ENCOUNTER — Ambulatory Visit (HOSPITAL_BASED_OUTPATIENT_CLINIC_OR_DEPARTMENT_OTHER): Admission: RE | Admit: 2023-01-11 | Payer: Medicare HMO | Source: Home / Self Care | Admitting: Urology

## 2023-01-11 DIAGNOSIS — I10 Essential (primary) hypertension: Secondary | ICD-10-CM

## 2023-01-11 SURGERY — TURBT, WITH CHEMOTHERAPEUTIC AGENT INSTILLATION INTO BLADDER
Anesthesia: General

## 2023-01-21 ENCOUNTER — Other Ambulatory Visit: Payer: Self-pay | Admitting: Urology

## 2023-01-24 NOTE — Progress Notes (Signed)
01/24/2023 11:12 AM Spoke w/ via phone for pre-op interview---patient Lab needs dos----  I Stat Chem 8 Lab results------EKG 09/07/22 in EPIC COVID test -----patient states asymptomatic no test needed Arrive at -------1000 NPO after MN NO Solid Food.  Clear liquids from MN until---0800 Med rec completed Medications to take morning of surgery -----none, hold losartan dos Diabetic medication -----na Patient instructed no nail polish to be worn day of surgery Patient instructed to bring photo id and insurance card day of surgery Patient aware to have Driver (ride ) / caregiver    for 24 hours after surgery -Husband, [name] Patient Special Instructions -----Bring CPAP machine Pre-Op special Istructions -----na Patient verbalized understanding of instructions that were given at this phone interview. Patient denies shortness of breath, chest pain, fever, cough at this phone interview.  Angel Williamson, Angel Williamson

## 2023-02-06 ENCOUNTER — Encounter (HOSPITAL_BASED_OUTPATIENT_CLINIC_OR_DEPARTMENT_OTHER)
Admission: RE | Disposition: A | Discharge status: Home / Self Care | Payer: Self-pay | Source: Home / Self Care | Attending: Urology

## 2023-02-06 ENCOUNTER — Encounter (HOSPITAL_BASED_OUTPATIENT_CLINIC_OR_DEPARTMENT_OTHER): Payer: Self-pay | Admitting: Urology

## 2023-02-06 ENCOUNTER — Ambulatory Visit (HOSPITAL_BASED_OUTPATIENT_CLINIC_OR_DEPARTMENT_OTHER)
Admission: RE | Admit: 2023-02-06 | Discharge: 2023-02-06 | Disposition: A | Discharge status: Home / Self Care | Payer: Medicare HMO | Attending: Urology | Admitting: Urology

## 2023-02-06 ENCOUNTER — Ambulatory Visit (HOSPITAL_BASED_OUTPATIENT_CLINIC_OR_DEPARTMENT_OTHER): Payer: Medicare HMO | Admitting: Certified Registered Nurse Anesthetist

## 2023-02-06 ENCOUNTER — Ambulatory Visit (HOSPITAL_COMMUNITY): Payer: Medicare HMO

## 2023-02-06 ENCOUNTER — Other Ambulatory Visit: Payer: Self-pay

## 2023-02-06 DIAGNOSIS — C679 Malignant neoplasm of bladder, unspecified: Secondary | ICD-10-CM | POA: Diagnosis not present

## 2023-02-06 DIAGNOSIS — D494 Neoplasm of unspecified behavior of bladder: Secondary | ICD-10-CM | POA: Diagnosis not present

## 2023-02-06 DIAGNOSIS — C678 Malignant neoplasm of overlapping sites of bladder: Secondary | ICD-10-CM | POA: Diagnosis not present

## 2023-02-06 DIAGNOSIS — I10 Essential (primary) hypertension: Secondary | ICD-10-CM | POA: Insufficient documentation

## 2023-02-06 DIAGNOSIS — Z09 Encounter for follow-up examination after completed treatment for conditions other than malignant neoplasm: Secondary | ICD-10-CM | POA: Insufficient documentation

## 2023-02-06 DIAGNOSIS — C674 Malignant neoplasm of posterior wall of bladder: Secondary | ICD-10-CM | POA: Diagnosis not present

## 2023-02-06 DIAGNOSIS — Z87891 Personal history of nicotine dependence: Secondary | ICD-10-CM | POA: Insufficient documentation

## 2023-02-06 DIAGNOSIS — C676 Malignant neoplasm of ureteric orifice: Secondary | ICD-10-CM | POA: Diagnosis not present

## 2023-02-06 LAB — POCT I-STAT, CHEM 8
BUN: 15 mg/dL (ref 8–23)
Calcium, Ion: 1.21 mmol/L (ref 1.15–1.40)
Chloride: 106 mmol/L (ref 98–111)
Creatinine, Ser: 0.8 mg/dL (ref 0.44–1.00)
Glucose, Bld: 110 mg/dL — ABNORMAL HIGH (ref 70–99)
HCT: 36 % (ref 36.0–46.0)
Hemoglobin: 12.2 g/dL (ref 12.0–15.0)
Potassium: 4 mmol/L (ref 3.5–5.1)
Sodium: 142 mmol/L (ref 135–145)
TCO2: 26 mmol/L (ref 22–32)

## 2023-02-06 SURGERY — TURBT, WITH CHEMOTHERAPEUTIC AGENT INSTILLATION INTO BLADDER
Anesthesia: General | Site: Bladder

## 2023-02-06 MED ORDER — DEXMEDETOMIDINE HCL IN NACL 80 MCG/20ML IV SOLN
INTRAVENOUS | Status: DC | PRN
  Administered 2023-02-06 (×2): 4 ug via BUCCAL

## 2023-02-06 MED ORDER — MIDAZOLAM HCL 5 MG/5ML IJ SOLN
INTRAMUSCULAR | Status: DC | PRN
  Administered 2023-02-06: 2 mg via INTRAVENOUS

## 2023-02-06 MED ORDER — PROPOFOL 10 MG/ML IV BOLUS
INTRAVENOUS | Status: DC | PRN
  Administered 2023-02-06: 160 mg via INTRAVENOUS

## 2023-02-06 MED ORDER — 0.9 % SODIUM CHLORIDE (POUR BTL) OPTIME
TOPICAL | Status: DC | PRN
  Administered 2023-02-06: 500 mL

## 2023-02-06 MED ORDER — CIPROFLOXACIN IN D5W 400 MG/200ML IV SOLN
400.0000 mg | INTRAVENOUS | Status: AC
  Administered 2023-02-06: 400 mg via INTRAVENOUS

## 2023-02-06 MED ORDER — FENTANYL CITRATE (PF) 100 MCG/2ML IJ SOLN
INTRAMUSCULAR | Status: DC | PRN
  Administered 2023-02-06 (×2): 50 ug via INTRAVENOUS

## 2023-02-06 MED ORDER — STERILE WATER FOR IRRIGATION IR SOLN
Status: DC | PRN
  Administered 2023-02-06: 500 mL

## 2023-02-06 MED ORDER — DEXAMETHASONE SODIUM PHOSPHATE 10 MG/ML IJ SOLN
INTRAMUSCULAR | Status: DC | PRN
  Administered 2023-02-06: 10 mg via INTRAVENOUS

## 2023-02-06 MED ORDER — TRAMADOL HCL 50 MG PO TABS: 50.0000 mg | ORAL_TABLET | Freq: Four times a day (QID) | ORAL | 0 refills | Status: DC | PRN

## 2023-02-06 MED ORDER — OXYCODONE HCL 5 MG PO TABS: 5.0000 mg | ORAL_TABLET | Freq: Once | ORAL | Status: DC | PRN

## 2023-02-06 MED ORDER — MIDAZOLAM HCL 2 MG/2ML IJ SOLN
INTRAMUSCULAR | Status: AC
  Filled 2023-02-06: qty 2

## 2023-02-06 MED ORDER — ONDANSETRON HCL 4 MG/2ML IJ SOLN
INTRAMUSCULAR | Status: DC | PRN
  Administered 2023-02-06: 4 mg via INTRAVENOUS

## 2023-02-06 MED ORDER — ONDANSETRON HCL 4 MG/2ML IJ SOLN
INTRAMUSCULAR | Status: AC
  Filled 2023-02-06: qty 2

## 2023-02-06 MED ORDER — LACTATED RINGERS IV SOLN: INTRAVENOUS | Status: DC

## 2023-02-06 MED ORDER — GEMCITABINE CHEMO FOR BLADDER INSTILLATION 2000 MG
2000.0000 mg | Freq: Once | INTRAVENOUS | Status: AC
  Administered 2023-02-06: 2000 mg via INTRAVESICAL
  Filled 2023-02-06: qty 2000

## 2023-02-06 MED ORDER — OXYCODONE HCL 5 MG/5ML PO SOLN: 5.0000 mg | Freq: Once | ORAL | Status: DC | PRN

## 2023-02-06 MED ORDER — FENTANYL CITRATE (PF) 100 MCG/2ML IJ SOLN
25.0000 ug | INTRAMUSCULAR | Status: DC | PRN
  Administered 2023-02-06: 25 ug via INTRAVENOUS

## 2023-02-06 MED ORDER — CIPROFLOXACIN IN D5W 400 MG/200ML IV SOLN
INTRAVENOUS | Status: AC
  Filled 2023-02-06: qty 200

## 2023-02-06 MED ORDER — PROPOFOL 10 MG/ML IV BOLUS
INTRAVENOUS | Status: AC
  Filled 2023-02-06: qty 20

## 2023-02-06 MED ORDER — FENTANYL CITRATE (PF) 100 MCG/2ML IJ SOLN
INTRAMUSCULAR | Status: AC
  Filled 2023-02-06: qty 2

## 2023-02-06 MED ORDER — ROCURONIUM BROMIDE 10 MG/ML (PF) SYRINGE
PREFILLED_SYRINGE | INTRAVENOUS | Status: DC | PRN
  Administered 2023-02-06: 50 mg via INTRAVENOUS

## 2023-02-06 MED ORDER — ROCURONIUM BROMIDE 10 MG/ML (PF) SYRINGE
PREFILLED_SYRINGE | INTRAVENOUS | Status: AC
  Filled 2023-02-06: qty 10

## 2023-02-06 MED ORDER — ONDANSETRON HCL 4 MG/2ML IJ SOLN: 4.0000 mg | Freq: Four times a day (QID) | INTRAMUSCULAR | Status: DC | PRN

## 2023-02-06 MED ORDER — LIDOCAINE 2% (20 MG/ML) 5 ML SYRINGE
INTRAMUSCULAR | Status: DC | PRN
  Administered 2023-02-06: 60 mg via INTRAVENOUS

## 2023-02-06 MED ORDER — DEXAMETHASONE SODIUM PHOSPHATE 10 MG/ML IJ SOLN
INTRAMUSCULAR | Status: AC
  Filled 2023-02-06: qty 1

## 2023-02-06 MED ORDER — SUGAMMADEX SODIUM 200 MG/2ML IV SOLN
INTRAVENOUS | Status: DC | PRN
  Administered 2023-02-06: 200 mg via INTRAVENOUS

## 2023-02-06 SURGICAL SUPPLY — 23 items
BAG DRAIN URO-CYSTO SKYTR STRL (DRAIN) ×1 IMPLANT
BAG DRN RND TRDRP ANRFLXCHMBR (UROLOGICAL SUPPLIES) ×1
BAG DRN UROCATH (DRAIN) ×1
BAG URINE DRAIN 2000ML AR STRL (UROLOGICAL SUPPLIES) ×1 IMPLANT
CATH FOLEY 2WAY SLVR  5CC 18FR (CATHETERS) ×1
CATH FOLEY 2WAY SLVR 5CC 18FR (CATHETERS) IMPLANT
CATH FOLEY 3WAY 30CC 22FR (CATHETERS) ×1 IMPLANT
CLOTH BEACON ORANGE TIMEOUT ST (SAFETY) ×1 IMPLANT
ELECT REM PT RETURN 9FT ADLT (ELECTROSURGICAL)
ELECTRODE REM PT RTRN 9FT ADLT (ELECTROSURGICAL) IMPLANT
EVACUATOR MICROVAS BLADDER (UROLOGICAL SUPPLIES) IMPLANT
GLOVE BIO SURGEON STRL SZ7.5 (GLOVE) ×1 IMPLANT
GOWN STRL REUS W/TWL LRG LVL3 (GOWN DISPOSABLE) ×2 IMPLANT
HOLDER FOLEY CATH W/STRAP (MISCELLANEOUS) IMPLANT
IV NS IRRIG 3000ML ARTHROMATIC (IV SOLUTION) ×4 IMPLANT
KIT TURNOVER CYSTO (KITS) ×1 IMPLANT
MANIFOLD NEPTUNE II (INSTRUMENTS) ×1 IMPLANT
NS IRRIG 500ML POUR BTL (IV SOLUTION) IMPLANT
PACK CYSTO (CUSTOM PROCEDURE TRAY) ×1 IMPLANT
SLEEVE SCD COMPRESS KNEE MED (STOCKING) ×1 IMPLANT
SYR 30ML LL (SYRINGE) IMPLANT
TUBE CONNECTING 12X1/4 (SUCTIONS) IMPLANT
WATER STERILE IRR 500ML POUR (IV SOLUTION) ×1 IMPLANT

## 2023-02-06 NOTE — Transfer of Care (Signed)
Immediate Anesthesia Transfer of Care Note  Patient: Angel Williamson  Procedure(s) Performed: TRANSURETHRAL RESECTION OF BLADDER TUMOR (TURBT) with POST OPERATIVE  GEMCITABINE INTRAVESICAL INSTILLATION (Bladder)  Patient Location: PACU  Anesthesia Type:General  Level of Consciousness: awake, alert , and oriented  Airway & Oxygen Therapy: Patient Spontanous Breathing and Patient connected to nasal cannula oxygen  Post-op Assessment: Report given to RN and Post -op Vital signs reviewed and stable  Post vital signs: Reviewed and stable  Last Vitals:  Vitals Value Taken Time  BP    Temp    Pulse 78 02/06/23 1342  Resp 13 02/06/23 1342  SpO2 95 % 02/06/23 1342  Vitals shown include unvalidated device data.  Last Pain:  Vitals:   02/06/23 1029  TempSrc: Oral  PainSc: 0-No pain      Patients Stated Pain Goal: 4 (02/06/23 1029)  Complications: No notable events documented.

## 2023-02-06 NOTE — Interval H&P Note (Signed)
History and Physical Interval Note:  02/06/2023 12:45 PM  Angel Williamson  has presented today for surgery, with the diagnosis of RECURRENT BLADDER CANCER.  The various methods of treatment have been discussed with the patient and family. After consideration of risks, benefits and other options for treatment, the patient has consented to  Procedure(s) with comments: TRANSURETHRAL RESECTION OF BLADDER TUMOR (TURBT) with GEMCITABINE INTRAVESICAL INSTILLATION (N/A) - 45 MINUTES as a surgical intervention.  The patient's history has been reviewed, patient examined, no change in status, stable for surgery.  I have reviewed the patient's chart and labs.  Questions were answered to the patient's satisfaction.     Crist Fat

## 2023-02-06 NOTE — Anesthesia Procedure Notes (Signed)
Procedure Name: Intubation Date/Time: 02/06/2023 1:02 PM  Performed by: Kazi Reppond D, CRNAPre-anesthesia Checklist: Patient identified, Emergency Drugs available, Suction available and Patient being monitored Patient Re-evaluated:Patient Re-evaluated prior to induction Oxygen Delivery Method: Circle system utilized Preoxygenation: Pre-oxygenation with 100% oxygen Induction Type: IV induction Ventilation: Mask ventilation without difficulty Laryngoscope Size: Mac and 3 Grade View: Grade I Tube type: Oral Tube size: 7.0 mm Number of attempts: 1 Airway Equipment and Method: Stylet and Oral airway Placement Confirmation: ETT inserted through vocal cords under direct vision, positive ETCO2 and breath sounds checked- equal and bilateral Secured at: 21 cm Tube secured with: Tape Dental Injury: Teeth and Oropharynx as per pre-operative assessment

## 2023-02-06 NOTE — Discharge Instructions (Addendum)
Transurethral Resection of Bladder Tumor (TURBT) or Bladder Biopsy   Definition:  Transurethral Resection of the Bladder Tumor is a surgical procedure used to diagnose and remove tumors within the bladder. TURBT is the most common treatment for early stage bladder cancer.  General instructions:     Your recent bladder surgery requires very little post hospital care but some definite precautions.  Despite the fact that no skin incisions were used, the area around the bladder incisions are raw and covered with scabs to promote healing and prevent bleeding. Certain precautions are needed to insure that the scabs are not disturbed over the next 2-4 weeks while the healing proceeds.  Because the raw surface inside your bladder and the irritating effects of urine you may expect frequency of urination and/or urgency (a stronger desire to urinate) and perhaps even getting up at night more often. This will usually resolve or improve slowly over the healing period. You may see some blood in your urine over the first 6 weeks. Do not be alarmed, even if the urine was clear for a while. Get off your feet and drink lots of fluids until clearing occurs. If you start to pass clots or don't improve call us.  Diet:  You may return to your normal diet immediately. Because of the raw surface of your bladder, alcohol, spicy foods, foods high in acid and drinks with caffeine may cause irritation or frequency and should be used in moderation. To keep your urine flowing freely and avoid constipation, drink plenty of fluids during the day (8-10 glasses). Tip: Avoid cranberry juice because it is very acidic.  Activity:  Your physical activity doesn't need to be restricted. However, if you are very active, you may see some blood in the urine. We suggest that you reduce your activity under the circumstances until the bleeding has stopped.  Bowels:  It is important to keep your bowels regular during the postoperative  period. Straining with bowel movements can cause bleeding. A bowel movement every other day is reasonable. Use a mild laxative if needed, such as milk of magnesia 2-3 tablespoons, or 2 Dulcolax tablets. Call if you continue to have problems. If you had been taking narcotics for pain, before, during or after your surgery, you may be constipated. Take a laxative if necessary.    Medication:  You should resume your pre-surgery medications unless told not to. In addition you may be given an antibiotic to prevent or treat infection. Antibiotics are not always necessary. All medication should be taken as prescribed until the bottles are finished unless you are having an unusual reaction to one of the drugs.   Post Anesthesia Home Care Instructions  Activity: Get plenty of rest for the remainder of the day. A responsible individual must stay with you for 24 hours following the procedure.  For the next 24 hours, DO NOT: -Drive a car -Operate machinery -Drink alcoholic beverages -Take any medication unless instructed by your physician -Make any legal decisions or sign important papers.  Meals: Start with liquid foods such as gelatin or soup. Progress to regular foods as tolerated. Avoid greasy, spicy, heavy foods. If nausea and/or vomiting occur, drink only clear liquids until the nausea and/or vomiting subsides. Call your physician if vomiting continues.  Special Instructions/Symptoms: Your throat may feel dry or sore from the anesthesia or the breathing tube placed in your throat during surgery. If this causes discomfort, gargle with warm salt water. The discomfort should disappear within 24 hours.       

## 2023-02-06 NOTE — Op Note (Addendum)
Preoperative diagnosis:  Recurrent bladder cancer  Postoperative diagnosis:  same   Procedure: Bladder biopsy and fulgaration <1.0cm Intravesical gemcitabine instillation  Surgeon: Crist Fat, MD  Anesthesia: General  Complications: None  Intraoperative findings:  #1 small area of recurrence on posterior wall and right ureteral orifice #2 two additional areas biopsy on right bladder wall  EBL: Minimal  Specimens:  #1 - posterior bladder wall, 24mm #2 - right posterior bladder wall, 42mm #3 - right ureteral orifice, 72mm #4 - right side wall, 44mm  Indication: Angel Williamson is a 67 y.o. patient with history of recurrent bladder cancer who was noted to have very small areas of recurrence on her most recent cystoscopic evaluation in clinic.Marland Kitchen  After reviewing the management options for treatment, he elected to proceed with the above surgical procedure(s). We have discussed the potential benefits and risks of the procedure, side effects of the proposed treatment, the likelihood of the patient achieving the goals of the procedure, and any potential problems that might occur during the procedure or recuperation. Informed consent has been obtained.  Description of procedure:  The patient was taken to the operating room and general anesthesia was induced.  The patient was placed in the dorsal lithotomy position, prepped and draped in the usual sterile fashion, and preoperative antibiotics were administered. A preoperative time-out was performed.   2130 degree cystoscope was gently passed to the patient's urethra into the bladder under visual guidance.  The above findings were noted.  I then exchanged the 21 French sheath for a 26 French resectoscope sheath.  I used the biopsy forceps to biopsy and remove the areas as noted above.  I then used the loop cautery device to fulgurate the area where things were biopsied.  The right ureteral orifice was noted to be widely patent after I  fulgurated the area surrounding it.  The remainder of the bladder was unremarkable.  In the PACU, ~76mL's with 2gm of Gemcitabine was instilled into the patient's bladder through an 60 French Foley catheter.  This was allowed to dwell for 60-90 minutes prior to emptying the Foley catheter and removing it.     Crist Fat, M.D.

## 2023-02-06 NOTE — H&P (Signed)
f/u for transitional cell carcinoma  HPI: Angel Williamson is a 67 year-old female established patient who is here for surveillance of bladder cancer.  She underwent a TURBT. Her last bladder tumor was resected 08/31/2022. She has had the a total of 3 bladder resections. The patient had their first TURBT in approximately 06/29/2021.   Tumor Pathology: Ta LG NMIBC, 2nd resection was small PUNLMP, 3rd resection was LG TCC.   She had treatment with the following intravesical agents: gemcitabine.   Her last cysto was 08/31/2022. His cystoscopy demonstrated small recurrence right posterior wall.   Her last radiologic test to evaluate the kidneys was approximately 06/29/2021. Imaging results: RPG - nml. The patient did not have labs prior to her office visit today.   She is not having pain in new locations. She has not had blood in her urine recently. She has not recently had unwanted weight loss.   Patient has done well over the last 52-month interval hematuria. She has had some urgency and frequency.   Small bladder cancer resected November 2023, this was complicated by small perforation in the bladder. She kept her catheter for 4 days after the surgery.   Interval: The patient follows up today for her scheduled cystoscopy 3 months from her last treatment. She denies any hematuria or pain. She is otherwise been doing fairly well.     ALLERGIES: Shellfish    MEDICATIONS: Hydrochlorothiazide  Phenazopyridine Hcl  Amlodipine Besylate 2.5 mg tablet  Benazepril Hcl  Losartan Potassium 25 mg tablet  Spironolactone     GU PSH: Cysto Fulgurate < 0.5 cm - 11/09/2021 Cystoscopy - 08/14/2022, 02/13/2022, 10/10/2021, 06/23/2021 Cystoscopy TURBT <2 cm - 09/07/2022 Cystoscopy TURBT 2-5 cm - 07/06/2021     NON-GU PSH: No Non-GU PSH    GU PMH: Acute Cystitis/UTI - 10/23/2022 Bladder Cancer, Unspec - 09/24/2022 Bladder Cancer overlapping sites - 08/14/2022, - 02/13/2022 Bladder Cancer Lateral - 11/09/2021, -  10/10/2021, - 07/20/2021, - 06/23/2021 Dorsalgia, Unspec - 07/20/2021    NON-GU PMH: Anxiety Hypertension Sleep Apnea    FAMILY HISTORY: 1 Daughter - Daughter 1 son - Son Diabetes - Grandfather nephrolithiasis - Brother   SOCIAL HISTORY: Marital Status: Married Preferred Language: English; Ethnicity: Not Hispanic Or Latino; Race: White Current Smoking Status: Patient does not smoke anymore. Has not smoked since 05/30/1987. Smoked for 15 years. Smoked 1 pack per day.   Tobacco Use Assessment Completed: Used Tobacco in last 30 days? Has never drank.  Does not drink caffeine. Patient's occupation is/was Retired.    REVIEW OF SYSTEMS:    GU Review Female:   Patient denies frequent urination, hard to postpone urination, burning /pain with urination, get up at night to urinate, leakage of urine, stream starts and stops, trouble starting your stream, have to strain to urinate, and being pregnant.  Gastrointestinal (Upper):   Patient denies nausea, vomiting, and indigestion/ heartburn.  Gastrointestinal (Lower):   Patient denies diarrhea and constipation.  Constitutional:   Patient denies fever, night sweats, weight loss, and fatigue.  Skin:   Patient denies itching and skin rash/ lesion.  Eyes:   Patient denies blurred vision and double vision.  Ears/ Nose/ Throat:   Patient denies sore throat and sinus problems.  Hematologic/Lymphatic:   Patient denies swollen glands and easy bruising.  Cardiovascular:   Patient denies leg swelling and chest pains.  Respiratory:   Patient denies cough and shortness of breath.  Endocrine:   Patient denies excessive thirst.  Musculoskeletal:   Patient denies  back pain and joint pain.  Neurological:   Patient denies headaches and dizziness.  Psychologic:   Patient denies depression and anxiety.   VITAL SIGNS: None   Complexity of Data:  Source Of History:  Patient  Records Review:   Pathology Reports, Previous Doctor Records, Previous Patient Records,  POC Tool  Urine Test Review:   Urinalysis   PROCEDURES:         Flexible Cystoscopy - 52000  Risks, benefits, and some of the potential complications of the procedure were discussed at length with the patient including infection, bleeding, voiding discomfort, urinary retention, fever, chills, sepsis, and others. All questions were answered. Informed consent was obtained. Antibiotic prophylaxis was given. Sterile technique and intraurethral analgesia were used.  Meatus:  Normal size. Normal location. Normal condition.  Urethra:  No hypermobility. No leakage.  Ureteral Orifices:  Normal location. Normal size. Normal shape. Effluxed clear urine.  Bladder:  For very small areas of recurrence. 1 at the mid posterior bladder wall which was a 2 mm papillary lesion. The others were very small flat areas located on the right posterior bladder wall.      The lower urinary tract was carefully examined. The procedure was well-tolerated and without complications. Antibiotic instructions were given. Instructions were given to call the office immediately for bloody urine, difficulty urinating, urinary retention, painful or frequent urination, fever, chills, nausea, vomiting or other illness. The patient stated that she understood these instructions and would comply with them.         Urinalysis w/Scope Dipstick Dipstick Cont'd Micro  Color: Straw Bilirubin: Neg mg/dL WBC/hpf: NS (Not Seen)  Appearance: Clear Ketones: Neg mg/dL RBC/hpf: NS (Not Seen)  Specific Gravity: 1.010 Blood: Trace ery/uL Bacteria: NS (Not Seen)  pH: 5.5 Protein: Neg mg/dL Cystals: NS (Not Seen)  Glucose: Neg mg/dL Urobilinogen: 0.2 mg/dL Casts: NS (Not Seen)    Nitrites: Neg Trichomonas: Not Present    Leukocyte Esterase: Neg leu/uL Mucous: Not Present      Epithelial Cells: NS (Not Seen)      Yeast: NS (Not Seen)      Sperm: Not Present    ASSESSMENT:      ICD-10 Details  1 GU:   Bladder Cancer overlapping sites - C67.8       PLAN:           Document Letter(s):  Created for Patient: Clinical Summary         Notes:   Unfortunately the patient has several areas of very small recurrence. We discussed management strategies, recommended we proceed to the operating room for resection. Following the resection we will perform gentamicin installations. Will then follow-up afterwards to discuss the option of starting BCG. I reviewed the treatment plan with the patient in significant detail and she is opted to proceed.

## 2023-02-06 NOTE — Anesthesia Preprocedure Evaluation (Addendum)
Anesthesia Evaluation  Patient identified by MRN, date of birth, ID band Patient awake    Reviewed: Allergy & Precautions, H&P , NPO status , Patient's Chart, lab work & pertinent test results  Airway Mallampati: II   Neck ROM: full    Dental   Pulmonary sleep apnea , former smoker   breath sounds clear to auscultation       Cardiovascular hypertension,  Rhythm:regular Rate:Normal     Neuro/Psych  PSYCHIATRIC DISORDERS Anxiety        GI/Hepatic   Endo/Other    Renal/GU Renal cyst   Bladder Ca    Musculoskeletal   Abdominal   Peds  Hematology   Anesthesia Other Findings   Reproductive/Obstetrics                             Anesthesia Physical Anesthesia Plan  ASA: 2  Anesthesia Plan: General   Post-op Pain Management:    Induction: Intravenous  PONV Risk Score and Plan: 3 and Ondansetron, Dexamethasone, Midazolam and Treatment may vary due to age or medical condition  Airway Management Planned: Oral ETT  Additional Equipment:   Intra-op Plan:   Post-operative Plan: Extubation in OR  Informed Consent: I have reviewed the patients History and Physical, chart, labs and discussed the procedure including the risks, benefits and alternatives for the proposed anesthesia with the patient or authorized representative who has indicated his/her understanding and acceptance.     Dental advisory given  Plan Discussed with: CRNA, Anesthesiologist and Surgeon  Anesthesia Plan Comments:        Anesthesia Quick Evaluation

## 2023-02-07 LAB — SURGICAL PATHOLOGY

## 2023-02-07 NOTE — Anesthesia Postprocedure Evaluation (Signed)
Anesthesia Post Note  Patient: Angel Williamson  Procedure(s) Performed: TRANSURETHRAL RESECTION OF BLADDER TUMOR (TURBT) with POST OPERATIVE  GEMCITABINE INTRAVESICAL INSTILLATION (Bladder)     Patient location during evaluation: PACU Anesthesia Type: General Level of consciousness: awake and alert Pain management: pain level controlled Vital Signs Assessment: post-procedure vital signs reviewed and stable Respiratory status: spontaneous breathing, nonlabored ventilation, respiratory function stable and patient connected to nasal cannula oxygen Cardiovascular status: blood pressure returned to baseline and stable Postop Assessment: no apparent nausea or vomiting Anesthetic complications: no   No notable events documented.  Last Vitals:  Vitals:   02/06/23 1445 02/06/23 1528  BP: (!) 140/71 135/82  Pulse: 63 60  Resp: 15 18  Temp:    SpO2: 92% 98%    Last Pain:  Vitals:   02/06/23 1528  TempSrc:   PainSc: 4                  [name]

## 2023-02-11 ENCOUNTER — Encounter: Payer: Self-pay | Admitting: Internal Medicine

## 2023-02-11 NOTE — Patient Instructions (Addendum)
Blood work was ordered.   The lab is on the first floor.    Medications changes include :   None     Return in about 1 year (around 02/12/2024) for Physical Exam.    Health Maintenance, Female Adopting a healthy lifestyle and getting preventive care are important in promoting health and wellness. Ask your health care provider about: The right schedule for you to have regular tests and exams. Things you can do on your own to prevent diseases and keep yourself healthy. What should I know about diet, weight, and exercise? Eat a healthy diet  Eat a diet that includes plenty of vegetables, fruits, low-fat dairy products, and lean protein. Do not eat a lot of foods that are high in solid fats, added sugars, or sodium. Maintain a healthy weight Body mass index (BMI) is used to identify weight problems. It estimates body fat based on height and weight. Your health care provider can help determine your BMI and help you achieve or maintain a healthy weight. Get regular exercise Get regular exercise. This is one of the most important things you can do for your health. Most adults should: Exercise for at least 150 minutes each week. The exercise should increase your heart rate and make you sweat (moderate-intensity exercise). Do strengthening exercises at least twice a week. This is in addition to the moderate-intensity exercise. Spend less time sitting. Even light physical activity can be beneficial. Watch cholesterol and blood lipids Have your blood tested for lipids and cholesterol at 67 years of age, then have this test every 5 years. Have your cholesterol levels checked more often if: Your lipid or cholesterol levels are high. You are older than 67 years of age. You are at high risk for heart disease. What should I know about cancer screening? Depending on your health history and family history, you may need to have cancer screening at various ages. This may include screening  for: Breast cancer. Cervical cancer. Colorectal cancer. Skin cancer. Lung cancer. What should I know about heart disease, diabetes, and high blood pressure? Blood pressure and heart disease High blood pressure causes heart disease and increases the risk of stroke. This is more likely to develop in people who have high blood pressure readings or are overweight. Have your blood pressure checked: Every 3-5 years if you are 14-18 years of age. Every year if you are 29 years old or older. Diabetes Have regular diabetes screenings. This checks your fasting blood sugar level. Have the screening done: Once every three years after age 71 if you are at a normal weight and have a low risk for diabetes. More often and at a younger age if you are overweight or have a high risk for diabetes. What should I know about preventing infection? Hepatitis B If you have a higher risk for hepatitis B, you should be screened for this virus. Talk with your health care provider to find out if you are at risk for hepatitis B infection. Hepatitis C Testing is recommended for: Everyone born from [birth year] through 1965. Anyone with known risk factors for hepatitis C. Sexually transmitted infections (STIs) Get screened for STIs, including gonorrhea and chlamydia, if: You are sexually active and are younger than 67 years of age. You are older than 67 years of age and your health care provider tells you that you are at risk for this type of infection. Your sexual activity has changed since you were last screened, and you are  at increased risk for chlamydia or gonorrhea. Ask your health care provider if you are at risk. Ask your health care provider about whether you are at high risk for HIV. Your health care provider may recommend a prescription medicine to help prevent HIV infection. If you choose to take medicine to prevent HIV, you should first get tested for HIV. You should then be tested every 3 months for as long as you  are taking the medicine. Pregnancy If you are about to stop having your period (premenopausal) and you may become pregnant, seek counseling before you get pregnant. Take 400 to 800 micrograms (mcg) of folic acid every day if you become pregnant. Ask for birth control (contraception) if you want to prevent pregnancy. Osteoporosis and menopause Osteoporosis is a disease in which the bones lose minerals and strength with aging. This can result in bone fractures. If you are 71 years old or older, or if you are at risk for osteoporosis and fractures, ask your health care provider if you should: Be screened for bone loss. Take a calcium or vitamin D supplement to lower your risk of fractures. Be given hormone replacement therapy (HRT) to treat symptoms of menopause. Follow these instructions at home: Alcohol use Do not drink alcohol if: Your health care provider tells you not to drink. You are pregnant, may be pregnant, or are planning to become pregnant. If you drink alcohol: Limit how much you have to: 0-1 drink a day. Know how much alcohol is in your drink. In the [place] one drink equals one 12 oz bottle of beer (355 mL), one 5 oz glass of wine (148 mL), or one 1 oz glass of hard liquor (44 mL). Lifestyle Do not use any products that contain nicotine or tobacco. These products include cigarettes, chewing tobacco, and vaping devices, such as e-cigarettes. If you need help quitting, ask your health care provider. Do not use street drugs. Do not share needles. Ask your health care provider for help if you need support or information about quitting drugs. General instructions Schedule regular health, dental, and eye exams. Stay current with your vaccines. Tell your health care provider if: You often feel depressed. You have ever been abused or do not feel safe at home. Summary Adopting a healthy lifestyle and getting preventive care are important in promoting health and wellness. Follow your  health care provider's instructions about healthy diet, exercising, and getting tested or screened for diseases. Follow your health care provider's instructions on monitoring your cholesterol and blood pressure. This information is not intended to replace advice given to you by your health care provider. Make sure you discuss any questions you have with your health care provider. Document Revised: 03/06/2021 Document Reviewed: 03/06/2021 Elsevier Patient Education  [employer].

## 2023-02-11 NOTE — Progress Notes (Unsigned)
Subjective:    Patient ID: Angel Williamson, female    DOB: Feb 22, 1956, 67 y.o.   MRN: [id number]      HPI Angel Williamson is here for a Physical exam and her chronic medical problems.    Just had another TURBT for her bladder cancer and gemcitabine instillation.  She has f/u with him in 3 months.    She is feeling fairly well since the procedure.  Pain has subsided-no longer has any blood or discomfort with urination.  No concerns otherwise feels well  Medications and allergies reviewed with patient and updated if appropriate.  Current Outpatient Medications on File Prior to Visit  Medication Sig Dispense Refill   Calcium Carbonate (CALCIUM 500 PO) Take 1,000 mg by mouth daily.     Cholecalciferol (D3 ADULT PO) Take 2,000 Units by mouth daily.     EPINEPHrine 0.3 mg/0.3 mL IJ SOAJ injection Use as directed for life-threatening allergic reaction. 2 each 3   fluticasone (FLONASE) 50 MCG/ACT nasal spray Place 1 spray into both nostrils at bedtime. 16 g 2   LORazepam (ATIVAN) 0.5 MG tablet Take 1 tablet (0.5 mg total) by mouth every 8 (eight) hours as needed for anxiety. 30 tablet 2   losartan (COZAAR) 25 MG tablet Take 1 tablet (25 mg total) by mouth daily. 90 tablet 1   traMADol (ULTRAM) 50 MG tablet Take 1-2 tablets (50-100 mg total) by mouth every 6 (six) hours as needed for moderate pain. 5 tablet 0   Current Facility-Administered Medications on File Prior to Visit  Medication Dose Route Frequency Provider Last Rate Last Admin   gemcitabine (GEMZAR) chemo syringe for bladder instillation 2,000 mg  2,000 mg Bladder Instillation Once Crist Fat, MD        Review of Systems  Constitutional:  Negative for fever.  Eyes:  Negative for visual disturbance.  Respiratory:  Negative for cough, shortness of breath and wheezing.   Cardiovascular:  Negative for chest pain, palpitations and leg swelling.  Gastrointestinal:  Negative for abdominal pain, blood in stool, constipation and  diarrhea.       No gerd  Genitourinary:  Negative for dysuria and hematuria.  Musculoskeletal:  Negative for arthralgias and back pain.  Skin:  Negative for rash.  Neurological:  Negative for light-headedness and headaches.  Psychiatric/Behavioral:  Negative for dysphoric mood. The patient is nervous/anxious (situational).        Objective:   Vitals:   02/12/23 0957  BP: 122/76  Pulse: 80  Temp: 98.3 F (36.8 C)  SpO2: 99%   Filed Weights   02/12/23 0957  Weight: 190 lb 12.8 oz (86.5 kg)   Body mass index is 33.8 kg/m.  BP Readings from Last 3 Encounters:  02/12/23 122/76  02/06/23 135/82  01/03/23 124/72    Wt Readings from Last 3 Encounters:  02/12/23 190 lb 12.8 oz (86.5 kg)  02/06/23 189 lb 1.6 oz (85.8 kg)  01/03/23 191 lb 3.2 oz (86.7 kg)       Physical Exam Constitutional: She appears well-developed and well-nourished. No distress.  HENT:  Head: Normocephalic and atraumatic.  Right Ear: External ear normal. Normal ear canal and TM Left Ear: External ear normal.  Normal ear canal and TM Mouth/Throat: Oropharynx is clear and moist.  Eyes: Conjunctivae normal.  Neck: Neck supple. No tracheal deviation present. No thyromegaly present.  No carotid bruit  Cardiovascular: Normal rate, regular rhythm and normal heart sounds.   No murmur heard.  No edema.  Pulmonary/Chest: Effort normal and breath sounds normal. No respiratory distress. She has no wheezes. She has no rales.  Breast: deferred   Abdominal: Soft. She exhibits no distension. There is no tenderness.  Lymphadenopathy: She has no cervical adenopathy.  Skin: Skin is warm and dry. She is not diaphoretic.  Psychiatric: She has a normal mood and affect. Her behavior is normal.     Lab Results  Component Value Date   WBC 7.4 02/05/2022   HGB 12.2 02/06/2023   HCT 36.0 02/06/2023   PLT 313.0 02/05/2022   GLUCOSE 110 (H) 02/06/2023   CHOL 168 02/05/2022   TRIG 133.0 02/05/2022   HDL 53.60  02/05/2022   LDLCALC 88 02/05/2022   ALT 14 02/05/2022   AST 15 02/05/2022   NA 142 02/06/2023   K 4.0 02/06/2023   CL 106 02/06/2023   CREATININE 0.80 02/06/2023   BUN 15 02/06/2023   CO2 29 02/05/2022   TSH 1.25 02/05/2022   HGBA1C 6.0 02/05/2022         Assessment & Plan:   Physical exam: Screening blood work  ordered Exercise  walking Weight  encouraged weight loss-she knows she would benefit from weight loss.  Advise decrease portions, healthy diet Substance abuse  none   Reviewed recommended immunizations.   Health Maintenance  Topic Date Due   DTaP/Tdap/Td (1 - Tdap) Never done   Medicare Annual Wellness (AWV)  02/15/2023   COVID-19 Vaccine (4 - 2023-24 season) 02/28/2023 (Originally 06/29/2022)   Zoster Vaccines- Shingrix (1 of 2) 05/14/2023 (Originally 01/26/1975)   Pneumonia Vaccine 80+ Years old (1 of 1 - PCV) 02/12/2024 (Originally 01/25/2021)   COLONOSCOPY (Pts 45-12yrs Insurance coverage will need to be confirmed)  05/09/2023   INFLUENZA VACCINE  05/30/2023   DEXA SCAN  01/27/2024   MAMMOGRAM  09/13/2024   Hepatitis C Screening  Completed   HPV VACCINES  Aged Out          See Problem List for Assessment and Plan of chronic medical problems.

## 2023-02-12 ENCOUNTER — Ambulatory Visit (INDEPENDENT_AMBULATORY_CARE_PROVIDER_SITE_OTHER): Payer: Medicare HMO | Admitting: Internal Medicine

## 2023-02-12 VITALS — BP 122/76 | HR 80 | Temp 98.3°F | Ht 63.0 in | Wt 190.8 lb

## 2023-02-12 DIAGNOSIS — R7303 Prediabetes: Secondary | ICD-10-CM

## 2023-02-12 DIAGNOSIS — E6609 Other obesity due to excess calories: Secondary | ICD-10-CM

## 2023-02-12 DIAGNOSIS — F419 Anxiety disorder, unspecified: Secondary | ICD-10-CM | POA: Diagnosis not present

## 2023-02-12 DIAGNOSIS — M85859 Other specified disorders of bone density and structure, unspecified thigh: Secondary | ICD-10-CM | POA: Diagnosis not present

## 2023-02-12 DIAGNOSIS — E559 Vitamin D deficiency, unspecified: Secondary | ICD-10-CM | POA: Diagnosis not present

## 2023-02-12 DIAGNOSIS — C679 Malignant neoplasm of bladder, unspecified: Secondary | ICD-10-CM | POA: Diagnosis not present

## 2023-02-12 DIAGNOSIS — Z6833 Body mass index (BMI) 33.0-33.9, adult: Secondary | ICD-10-CM

## 2023-02-12 DIAGNOSIS — Z0000 Encounter for general adult medical examination without abnormal findings: Secondary | ICD-10-CM | POA: Diagnosis not present

## 2023-02-12 DIAGNOSIS — I10 Essential (primary) hypertension: Secondary | ICD-10-CM

## 2023-02-12 DIAGNOSIS — I700 Atherosclerosis of aorta: Secondary | ICD-10-CM

## 2023-02-12 LAB — COMPREHENSIVE METABOLIC PANEL
ALT: 17 U/L (ref 0–35)
AST: 15 U/L (ref 0–37)
Albumin: 4.3 g/dL (ref 3.5–5.2)
Alkaline Phosphatase: 83 U/L (ref 39–117)
BUN: 14 mg/dL (ref 6–23)
CO2: 29 mEq/L (ref 19–32)
Calcium: 9.6 mg/dL (ref 8.4–10.5)
Chloride: 103 mEq/L (ref 96–112)
Creatinine, Ser: 0.83 mg/dL (ref 0.40–1.20)
GFR: 73.14 mL/min (ref >60.00)
Glucose, Bld: 90 mg/dL (ref 70–99)
Potassium: 4.1 mEq/L (ref 3.5–5.1)
Sodium: 139 mEq/L (ref 135–145)
Total Bilirubin: 0.7 mg/dL (ref 0.2–1.2)
Total Protein: 7.4 g/dL (ref 6.0–8.3)

## 2023-02-12 LAB — LIPID PANEL
Cholesterol: 149 mg/dL (ref 0–200)
HDL: 51.4 mg/dL (ref >39.00)
LDL Cholesterol: 75 mg/dL (ref 0–99)
NonHDL: 97.19
Total CHOL/HDL Ratio: 3
Triglycerides: 113 mg/dL (ref 0.0–149.0)
VLDL: 22.6 mg/dL (ref 0.0–40.0)

## 2023-02-12 LAB — CBC WITH DIFFERENTIAL/PLATELET
Basophils Absolute: 0 10*3/uL (ref 0.0–0.1)
Basophils Relative: 0.9 % (ref 0.0–3.0)
Eosinophils Absolute: 0.1 10*3/uL (ref 0.0–0.7)
Eosinophils Relative: 2.9 % (ref 0.0–5.0)
HCT: 36.3 % (ref 36.0–46.0)
Hemoglobin: 12.4 g/dL (ref 12.0–15.0)
Lymphocytes Relative: 36.4 % (ref 12.0–46.0)
Lymphs Abs: 1.6 10*3/uL (ref 0.7–4.0)
MCHC: 34.1 g/dL (ref 30.0–36.0)
MCV: 90.6 fl (ref 78.0–100.0)
Monocytes Absolute: 0.4 10*3/uL (ref 0.1–1.0)
Monocytes Relative: 8 % (ref 3.0–12.0)
Neutro Abs: 2.3 10*3/uL (ref 1.4–7.7)
Neutrophils Relative %: 51.8 % (ref 43.0–77.0)
Platelets: 263 10*3/uL (ref 150.0–400.0)
RBC: 4 Mil/uL (ref 3.87–5.11)
RDW: 12.8 % (ref 11.5–15.5)
WBC: 4.5 10*3/uL (ref 4.0–10.5)

## 2023-02-12 LAB — VITAMIN D 25 HYDROXY (VIT D DEFICIENCY, FRACTURES): VITD: 23.78 ng/mL — ABNORMAL LOW (ref 30.00–100.00)

## 2023-02-12 LAB — HEMOGLOBIN A1C: Hgb A1c MFr Bld: 6.2 % (ref 4.6–6.5)

## 2023-02-12 LAB — TSH: TSH: 0.94 u[IU]/mL (ref 0.35–5.50)

## 2023-02-12 MED ORDER — LOSARTAN POTASSIUM 25 MG PO TABS: 25.0000 mg | ORAL_TABLET | Freq: Every day | ORAL | 3 refills | Status: DC

## 2023-02-12 NOTE — Assessment & Plan Note (Signed)
Chronic 6 days ago had TURBT with gemcitabine instillation for recurrent bladder cancer Has follow-up with urology in 3 months

## 2023-02-12 NOTE — Assessment & Plan Note (Addendum)
Chronic Seen on CT scan Lab Results  Component Value Date   LDLCALC 88 02/05/2022   Not taking any medication-would like to avoid it Stressed healthy diet, regular exercise

## 2023-02-12 NOTE — Assessment & Plan Note (Signed)
Chronic Blood pressure well-controlled CBC, CMP Continue losartan 25 mg daily

## 2023-02-12 NOTE — Assessment & Plan Note (Signed)
Chronic DEXA up-to-date Continue regular walking Continue calcium and vitamin D supplementation Will check vitamin D level

## 2023-02-12 NOTE — Assessment & Plan Note (Signed)
Chronic Taking vitamin D daily Check vitamin D level  

## 2023-02-12 NOTE — Assessment & Plan Note (Addendum)
Chronic situational Controlled, Stable Continue Ativan 0.25-0.5 mg every 8 hours as needed

## 2023-02-12 NOTE — Assessment & Plan Note (Signed)
Chronic Check a1c Low sugar / carb diet Stressed regular exercise  

## 2023-02-12 NOTE — Assessment & Plan Note (Signed)
Chronic Encouraged weight loss Stressed regular exercise Healthy diet, smaller portions

## 2023-02-25 ENCOUNTER — Telehealth: Payer: Self-pay

## 2023-02-25 NOTE — Telephone Encounter (Signed)
Contacted [name] to schedule their annual wellness visit. Appointment made for 03/12/23.  Agnes Lawrence, CMA (AAMA)  [facility] AWV [facility] [phone]

## 2023-02-27 NOTE — Telephone Encounter (Signed)
Created encounter in error

## 2023-03-12 ENCOUNTER — Ambulatory Visit (INDEPENDENT_AMBULATORY_CARE_PROVIDER_SITE_OTHER): Payer: Medicare HMO

## 2023-03-12 VITALS — Ht 63.0 in | Wt 185.0 lb

## 2023-03-12 DIAGNOSIS — Z0000 Encounter for general adult medical examination without abnormal findings: Secondary | ICD-10-CM | POA: Diagnosis not present

## 2023-03-12 NOTE — Patient Instructions (Addendum)
[name] , Thank you for taking time to come for your Medicare Wellness Visit. I appreciate your ongoing commitment to your health goals. Please review the following plan we discussed and let me know if I can assist you in the future.   These are the goals we discussed:  Goals      My goal for 2024 is to lose weight and exercise more.  My weight goal is to be 165 pounds.        This is a list of the screening recommended for you and due dates:  Health Maintenance  Topic Date Due   DTaP/Tdap/Td vaccine (1 - Tdap) Never done   COVID-19 Vaccine (4 - 2023-24 season) 06/29/2022   Zoster (Shingles) Vaccine (1 of 2) 05/14/2023*   Pneumonia Vaccine (1 of 1 - PCV) 02/12/2024*   Colon Cancer Screening  05/09/2023   Flu Shot  05/30/2023   DEXA scan (bone density measurement)  01/27/2024   Medicare Annual Wellness Visit  03/11/2024   Mammogram  09/13/2024   Hepatitis C Screening: USPSTF Recommendation to screen - Ages 18-79 yo.  Completed   HPV Vaccine  Aged Out  *Topic was postponed. The date shown is not the original due date.    Advanced directives: Yes; Please bring a copy of your health care power of attorney and living will to the office at your convenience.  Conditions/risks identified: Yes  Next appointment: Follow up in one year for your annual wellness visit.   Preventive Care 67 Years and Older, Female Preventive care refers to lifestyle choices and visits with your health care provider that can promote health and wellness. What does preventive care include? A yearly physical exam. This is also called an annual well check. Dental exams once or twice a year. Routine eye exams. Ask your health care provider how often you should have your eyes checked. Personal lifestyle choices, including: Daily care of your teeth and gums. Regular physical activity. Eating a healthy diet. Avoiding tobacco and drug use. Limiting alcohol use. Practicing safe sex. Taking low-dose aspirin  every day. Taking vitamin and mineral supplements as recommended by your health care provider. What happens during an annual well check? The services and screenings done by your health care provider during your annual well check will depend on your age, overall health, lifestyle risk factors, and family history of disease. Counseling  Your health care provider may ask you questions about your: Alcohol use. Tobacco use. Drug use. Emotional well-being. Home and relationship well-being. Sexual activity. Eating habits. History of falls. Memory and ability to understand (cognition). Work and work Astronomer. Reproductive health. Screening  You may have the following tests or measurements: Height, weight, and BMI. Blood pressure. Lipid and cholesterol levels. These may be checked every 5 years, or more frequently if you are over 18 years old. Skin check. Lung cancer screening. You may have this screening every year starting at age 22 if you have a 30-pack-year history of smoking and currently smoke or have quit within the past 15 years. Fecal occult blood test (FOBT) of the stool. You may have this test every year starting at age 58. Flexible sigmoidoscopy or colonoscopy. You may have a sigmoidoscopy every 5 years or a colonoscopy every 10 years starting at age 48. Hepatitis C blood test. Hepatitis B blood test. Sexually transmitted disease (STD) testing. Diabetes screening. This is done by checking your blood sugar (glucose) after you have not eaten for a while (fasting). You may have this  done every 1-3 years. Bone density scan. This is done to screen for osteoporosis. You may have this done starting at age 67. Mammogram. This may be done every 1-2 years. Talk to your health care provider about how often you should have regular mammograms. Talk with your health care provider about your test results, treatment options, and if necessary, the need for more tests. Vaccines  Your health  care provider may recommend certain vaccines, such as: Influenza vaccine. This is recommended every year. Tetanus, diphtheria, and acellular pertussis (Tdap, Td) vaccine. You may need a Td booster every 10 years. Zoster vaccine. You may need this after age 9. Pneumococcal 13-valent conjugate (PCV13) vaccine. One dose is recommended after age 67. Pneumococcal polysaccharide (PPSV23) vaccine. One dose is recommended after age 67. Talk to your health care provider about which screenings and vaccines you need and how often you need them. This information is not intended to replace advice given to you by your health care provider. Make sure you discuss any questions you have with your health care provider. Document Released: 11/11/2015 Document Revised: 07/04/2016 Document Reviewed: 08/16/2015 Elsevier Interactive Patient Education  2017 [employer].  Fall Prevention in the Home Falls can cause injuries. They can happen to people of all ages. There are many things you can do to make your home safe and to help prevent falls. What can I do on the outside of my home? Regularly fix the edges of walkways and driveways and fix any cracks. Remove anything that might make you trip as you walk through a door, such as a raised step or threshold. Trim any bushes or trees on the path to your home. Use bright outdoor lighting. Clear any walking paths of anything that might make someone trip, such as rocks or tools. Regularly check to see if handrails are loose or broken. Make sure that both sides of any steps have handrails. Any raised decks and porches should have guardrails on the edges. Have any leaves, snow, or ice cleared regularly. Use sand or salt on walking paths during winter. Clean up any spills in your garage right away. This includes oil or grease spills. What can I do in the bathroom? Use night lights. Install grab bars by the toilet and in the tub and shower. Do not use towel bars as grab  bars. Use non-skid mats or decals in the tub or shower. If you need to sit down in the shower, use a plastic, non-slip stool. Keep the floor dry. Clean up any water that spills on the floor as soon as it happens. Remove soap buildup in the tub or shower regularly. Attach bath mats securely with double-sided non-slip rug tape. Do not have throw rugs and other things on the floor that can make you trip. What can I do in the bedroom? Use night lights. Make sure that you have a light by your bed that is easy to reach. Do not use any sheets or blankets that are too big for your bed. They should not hang down onto the floor. Have a firm chair that has side arms. You can use this for support while you get dressed. Do not have throw rugs and other things on the floor that can make you trip. What can I do in the kitchen? Clean up any spills right away. Avoid walking on wet floors. Keep items that you use a lot in easy-to-reach places. If you need to reach something above you, use a strong step stool that  has a grab bar. Keep electrical cords out of the way. Do not use floor polish or wax that makes floors slippery. If you must use wax, use non-skid floor wax. Do not have throw rugs and other things on the floor that can make you trip. What can I do with my stairs? Do not leave any items on the stairs. Make sure that there are handrails on both sides of the stairs and use them. Fix handrails that are broken or loose. Make sure that handrails are as long as the stairways. Check any carpeting to make sure that it is firmly attached to the stairs. Fix any carpet that is loose or worn. Avoid having throw rugs at the top or bottom of the stairs. If you do have throw rugs, attach them to the floor with carpet tape. Make sure that you have a light switch at the top of the stairs and the bottom of the stairs. If you do not have them, ask someone to add them for you. What else can I do to help prevent  falls? Wear shoes that: Do not have high heels. Have rubber bottoms. Are comfortable and fit you well. Are closed at the toe. Do not wear sandals. If you use a stepladder: Make sure that it is fully opened. Do not climb a closed stepladder. Make sure that both sides of the stepladder are locked into place. Ask someone to hold it for you, if possible. Clearly mark and make sure that you can see: Any grab bars or handrails. First and last steps. Where the edge of each step is. Use tools that help you move around (mobility aids) if they are needed. These include: Canes. Walkers. Scooters. Crutches. Turn on the lights when you go into a dark area. Replace any light bulbs as soon as they burn out. Set up your furniture so you have a clear path. Avoid moving your furniture around. If any of your floors are uneven, fix them. If there are any pets around you, be aware of where they are. Review your medicines with your doctor. Some medicines can make you feel dizzy. This can increase your chance of falling. Ask your doctor what other things that you can do to help prevent falls. This information is not intended to replace advice given to you by your health care provider. Make sure you discuss any questions you have with your health care provider. Document Released: 08/11/2009 Document Revised: 03/22/2016 Document Reviewed: 11/19/2014 Elsevier Interactive Patient Education  2017 [employer].

## 2023-03-12 NOTE — Progress Notes (Signed)
I connected with  Angel Williamson on 03/12/23 by a audio enabled telemedicine application and verified that I am speaking with the correct person using two identifiers.  Patient Location: Home  Provider Location: Office/Clinic  I discussed the limitations of evaluation and management by telemedicine. The patient expressed understanding and agreed to proceed.  Patient Medicare AWV questionnaire was completed by the patient on 03/11/2023; I have confirmed that all information answered by patient is correct and no changes since this date.    Subjective:   Angel Williamson is a 67 y.o. female who presents for Medicare Annual (Subsequent) preventive examination.  Review of Systems     Cardiac Risk Factors include: advanced age (>32men, >79 women);hypertension;obesity (BMI >30kg/m2)     Objective:    Today's Vitals   03/12/23 1038 03/12/23 1039  Weight: 185 lb (83.9 kg)   Height: 5\' 3"  (1.6 m)   PainSc: 0-No pain 0-No pain   Body mass index is 32.77 kg/m.     03/12/2023   10:43 AM 02/06/2023   10:25 AM 09/07/2022    5:44 AM 02/14/2022   11:03 AM 08/21/2021   10:35 AM 07/06/2021    6:04 AM  Advanced Directives  Does Patient Have a Medical Advance Directive? Yes Yes Yes Yes Yes Yes  Type of Estate agent of [place];Living will Living will Healthcare Power of [place];Living will Living will;Healthcare Power of [employer] Power of Attorney  Does patient want to make changes to medical advance directive?    No - Patient declined    Copy of Healthcare Power of Attorney in Chart? No - copy requested  No - copy requested No - copy requested  No - copy requested    Current Medications (verified) Outpatient Encounter Medications as of 03/12/2023  Medication Sig   Calcium Carbonate (CALCIUM 500 PO) Take 1,000 mg by mouth daily.   Cholecalciferol (D3 ADULT PO) Take 2,000 Units by mouth daily.   EPINEPHrine 0.3 mg/0.3 mL IJ SOAJ injection Use as directed for  life-threatening allergic reaction.   fluticasone (FLONASE) 50 MCG/ACT nasal spray Place 1 spray into both nostrils at bedtime.   LORazepam (ATIVAN) 0.5 MG tablet Take 1 tablet (0.5 mg total) by mouth every 8 (eight) hours as needed for anxiety.   losartan (COZAAR) 25 MG tablet Take 1 tablet (25 mg total) by mouth daily.   traMADol (ULTRAM) 50 MG tablet Take 1-2 tablets (50-100 mg total) by mouth every 6 (six) hours as needed for moderate pain.   Facility-Administered Encounter Medications as of 03/12/2023  Medication   gemcitabine (GEMZAR) chemo syringe for bladder instillation 2,000 mg    Allergies (verified) Hydrochlorothiazide, Amlodipine, Losartan potassium, Phenazopyridine, Septra [sulfamethoxazole-trimethoprim], Shellfish allergy, Spironolactone, and Benazepril hcl   History: Past Medical History:  Diagnosis Date   Anxiety    Bladder cancer [facility])    urologist-- dr Marlou Porch   Breast fibrocystic disorder    History of adenomatous polyp of colon    History of COVID-19 03/2021   per pt positive home test ,  mild symptoms that resolved   Hypertension    followed by pcp    (ETT in epic 07-09-2014 normal)   Lung nodule    followed by dr Craige Cotta   OSA on CPAP    followed by dr Craige Cotta--- study in epic 08-07-20107 severe osa   Osteopenia    Renal cyst, left    Varicose veins of both lower extremities    Past Surgical History:  Procedure Laterality Date   COLONOSCOPY  2013   TRANSURETHRAL RESECTION OF BLADDER TUMOR WITH MITOMYCIN-C Bilateral 07/06/2021   Procedure: TRANSURETHRAL RESECTION OF BLADDER TUMOR BILATERAL RETROGRADE PYELOGRAM WITH GEMCITABINE INSTILLATION IN PACU;  Surgeon: Crist Fat, MD;  Location: [facility];  Service: Urology;  Laterality: Bilateral;   TRANSURETHRAL RESECTION OF BLADDER TUMOR WITH MITOMYCIN-C Bilateral 09/07/2022   Procedure: TRANSURETHRAL RESECTION OF BLADDER TUMOR BIOPSY, BILATERAL RETROGRADE PYELOGRAMS;  Surgeon: Crist Fat, MD;  Location: [facility];  Service: Urology;  Laterality: Bilateral;   Family History  Problem Relation Age of Onset   Anemia Mother    Colon polyps Father    Diabetes Maternal Grandfather    Cancer Paternal Grandmother        stomach   Stomach cancer Paternal Grandmother    Stroke Paternal Grandfather 74   Asthma Son    Anemia Other        Cousin    Colon cancer Neg Hx    Esophageal cancer Neg Hx    Rectal cancer Neg Hx    Social History   Socioeconomic History   Marital status: Married    Spouse name: Not on file   Number of children: 2   Years of education: Not on file   Highest education level: Associate degree: academic program  Occupational History   Occupation: Government social research officer    Comment: [employer]  Tobacco Use   Smoking status: Former    Packs/day: 1.00    Years: 15.00    Additional pack years: 0.00    Total pack years: 15.00    Types: Cigarettes    Quit date: 1988    Years since quitting: 36.3   Smokeless tobacco: Never  Vaping Use   Vaping Use: Never used  Substance and Sexual Activity   Alcohol use: Never   Drug use: Never   Sexual activity: Not on file  Other Topics Concern   Not on file  Social History Narrative   Lives with husband   Caffeine none since 2015   Social Determinants of Health   Financial Resource Strain: Low Risk  (03/12/2023)   Overall Financial Resource Strain (CARDIA)    Difficulty of Paying Living Expenses: Not hard at all  Food Insecurity: No Food Insecurity (03/12/2023)   Hunger Vital Sign    Worried About Running Out of Food in the Last Year: Never true    Ran Out of Food in the Last Year: Never true  Transportation Needs: No Transportation Needs (03/12/2023)   PRAPARE - Administrator, Civil Service (Medical): No    Lack of Transportation (Non-Medical): No  Physical Activity: Sufficiently Active (03/12/2023)   Exercise Vital Sign    Days of Exercise per Week: 5 days    Minutes  of Exercise per Session: 40 min  Stress: No Stress Concern Present (03/12/2023)   [employer] of Occupational Health - Occupational Stress Questionnaire    Feeling of Stress : Only a little  Social Connections: Unknown (03/12/2023)   Social Connection and Isolation Panel [NHANES]    Frequency of Communication with Friends and Family: More than three times a week    Frequency of Social Gatherings with Friends and Family: More than three times a week    Attends Religious Services: Patient unable to answer    Active Member of Clubs or Organizations: Yes    Attends Banker Meetings: More than 4 times per year  Marital Status: Married    Tobacco Counseling Counseling given: Not Answered   Clinical Intake:  Pre-visit preparation completed: Yes  Pain : No/denies pain Pain Score: 0-No pain     BMI - recorded: 32.77 Nutritional Status: BMI > 30  Obese Nutritional Risks: None Diabetes: No  How often do you need to have someone help you when you read instructions, pamphlets, or other written materials from your doctor or pharmacy?: 1 - Never What is the last grade level you completed in school?: HSG/Some College/College Graduate  Diabetic? No  Interpreter Needed?: No  Information entered by :: Malory Spurr N. Maybelle Depaoli, LPN.   Activities of Daily Living    03/12/2023   10:44 AM 03/11/2023   11:47 AM  In your present state of health, do you have any difficulty performing the following activities:  Hearing? 0 0  Vision? 0 0  Difficulty concentrating or making decisions? 0 0  Walking or climbing stairs? 0 0  Dressing or bathing? 0 0  Doing errands, shopping? 0 0  Preparing Food and eating ? N N  Using the Toilet? N N  In the past six months, have you accidently leaked urine? N N  Do you have problems with loss of bowel control? N N  Managing your Medications? N N  Managing your Finances? N N  Housekeeping or managing your Housekeeping? N N    Patient Care  Team: Pincus Sanes, MD as PCP - General (Internal Medicine) Jimmey Ralph Forest Becker, DO as Consulting Physician (Ophthalmology)  Indicate any recent Medical Services you may have received from other than [facility] providers in the past year (date may be approximate).     Assessment:   This is a routine wellness examination for [place].  Hearing/Vision screen Hearing Screening - Comments:: Denies hearing difficulties   Vision Screening - Comments:: Wears rx glasses - up to date with routine eye exams with Blair Hailey, DO.   Dietary issues and exercise activities discussed: Current Exercise Habits: Home exercise routine, Type of exercise: walking;treadmill;stretching;strength training/weights, Time (Minutes): 40, Frequency (Times/Week): 5, Weekly Exercise (Minutes/Week): 200, Intensity: Moderate, Exercise limited by: None identified   Goals Addressed             This Visit's Progress    My goal for 2024 is to lose weight and exercise more.  My weight goal is to be 165 pounds.        Depression Screen    03/12/2023   10:44 AM 02/12/2023   10:04 AM 10/01/2022   11:16 AM 02/14/2022   11:07 AM 02/05/2022   10:25 AM 12/06/2020   11:00 AM 10/15/2019    2:37 PM  PHQ 2/9 Scores  PHQ - 2 Score 0 0 0 0 1 1 0  PHQ- 9 Score 0 0 0   1     Fall Risk    03/12/2023   10:43 AM 03/11/2023   11:47 AM 02/12/2023   10:04 AM 10/01/2022   11:16 AM 02/14/2022   11:05 AM  Fall Risk   Falls in the past year? 0 0 0 0 0  Number falls in past yr: 0  0 0 0  Injury with Fall? 0  0 0 0  Risk for fall due to : No Fall Risks  No Fall Risks No Fall Risks No Fall Risks  Follow up Falls prevention discussed  Falls evaluation completed Falls evaluation completed Falls evaluation completed    FALL RISK PREVENTION PERTAINING TO THE HOME:  Any stairs  in or around the home? Yes  If so, are there any without handrails? No  Home free of loose throw rugs in walkways, pet beds, electrical cords, etc? Yes  Adequate lighting  in your home to reduce risk of falls? Yes   ASSISTIVE DEVICES UTILIZED TO PREVENT FALLS:  Life alert? No  Use of a cane, walker or w/c? No  Grab bars in the bathroom? Yes  Shower chair or bench in shower? Yes  Elevated toilet seat or a handicapped toilet? Yes   TIMED UP AND GO:  Was the test performed? No . Telephonic Visit  Cognitive Function:        03/12/2023   10:47 AM 02/14/2022   11:17 AM  6CIT Screen  What Year? 0 points 0 points  What month? 0 points 0 points  What time? 0 points 0 points  Count back from 20 0 points 0 points  Months in reverse 0 points 0 points  Repeat phrase 0 points 0 points  Total Score 0 points 0 points    Immunizations Immunization History  Administered Date(s) Administered   PFIZER(Purple Top)SARS-COV-2 Vaccination 01/18/2020, 02/08/2020, 09/06/2020    TDAP status: Declined, Education has been provided regarding the importance of this vaccine but patient still declined. Advised may receive this vaccine at local pharmacy or Health Dept. Aware to provide a copy of the vaccination record if obtained from local pharmacy or Health Dept. Verbalized acceptance and understanding.  Flu Vaccine status: Declined, Education has been provided regarding the importance of this vaccine but patient still declined. Advised may receive this vaccine at local pharmacy or Health Dept. Aware to provide a copy of the vaccination record if obtained from local pharmacy or Health Dept. Verbalized acceptance and understanding.  Pneumococcal vaccine status: Declined,  Education has been provided regarding the importance of this vaccine but patient still declined. Advised may receive this vaccine at local pharmacy or Health Dept. Aware to provide a copy of the vaccination record if obtained from local pharmacy or Health Dept. Verbalized acceptance and understanding.   Covid-19 vaccine status: Declined, Education has been provided regarding the importance of this vaccine but  patient still declined. Advised may receive this vaccine at local pharmacy or Health Dept.or vaccine clinic. Aware to provide a copy of the vaccination record if obtained from local pharmacy or Health Dept. Verbalized acceptance and understanding.  Qualifies for Shingles Vaccine? Yes   Zostavax completed No   Shingrix Completed?: No.    Education has been provided regarding the importance of this vaccine. Patient has been advised to call insurance company to determine out of pocket expense if they have not yet received this vaccine. Advised may also receive vaccine at local pharmacy or Health Dept. Verbalized acceptance and understanding.  Screening Tests Health Maintenance  Topic Date Due   DTaP/Tdap/Td (1 - Tdap) Never done   COVID-19 Vaccine (4 - 2023-24 season) 06/29/2022   Zoster Vaccines- Shingrix (1 of 2) 05/14/2023 (Originally 01/26/1975)   Pneumonia Vaccine 75+ Years old (1 of 1 - PCV) 02/12/2024 (Originally 01/25/2021)   COLONOSCOPY (Pts 45-58yrs Insurance coverage will need to be confirmed)  05/09/2023   INFLUENZA VACCINE  05/30/2023   DEXA SCAN  01/27/2024   Medicare Annual Wellness (AWV)  03/11/2024   MAMMOGRAM  09/13/2024   Hepatitis C Screening  Completed   HPV VACCINES  Aged Out    Health Maintenance  Health Maintenance Due  Topic Date Due   DTaP/Tdap/Td (1 - Tdap) Never done  COVID-19 Vaccine (4 - 2023-24 season) 06/29/2022    Colorectal cancer screening: Type of screening: Colonoscopy. Completed 05/08/2013. Repeat every 10 years  Mammogram status: Completed 09/13/2022. Repeat every year  Bone Density status: Completed 01/26/2021. Results reflect: Bone density results: OSTEOPENIA. Repeat every 3 years.  Lung Cancer Screening: (Low Dose CT Chest recommended if Age 31-80 years, 30 pack-year currently smoking OR have quit w/in 15years.) does not qualify.   Lung Cancer Screening Referral: no  Additional Screening:  Hepatitis C Screening: does qualify; Completed  08/22/2016  Vision Screening: Recommended annual ophthalmology exams for early detection of glaucoma and other disorders of the eye. Is the patient up to date with their annual eye exam?  Yes  Who is the provider or what is the name of the office in which the patient attends annual eye exams? Blair Hailey, DO. If pt is not established with a provider, would they like to be referred to a provider to establish care? No .   Dental Screening: Recommended annual dental exams for proper oral hygiene  Community Resource Referral / Chronic Care Management: CRR required this visit?  No   CCM required this visit?  No      Plan:     I have personally reviewed and noted the following in the patient's chart:   Medical and social history Use of alcohol, tobacco or illicit drugs  Current medications and supplements including opioid prescriptions. Patient is not currently taking opioid prescriptions. Functional ability and status Nutritional status Physical activity Advanced directives List of other physicians Hospitalizations, surgeries, and ER visits in previous 12 months Vitals Screenings to include cognitive, depression, and falls Referrals and appointments  In addition, I have reviewed and discussed with patient certain preventive protocols, quality metrics, and best practice recommendations. A written personalized care plan for preventive services as well as general preventive health recommendations were provided to patient.     Mickeal Needy, LPN   02/04/8118   Nurse Notes:  Normal cognitive status assessed by direct observation via telephone conversation by this Nurse Health Advisor. No abnormalities found.

## 2023-04-15 IMAGING — CT CT CHEST W/O CM
2 of 4 series · 15 of 36 positions shown, 18 images · non-contrast
Comparison: CT abdomen and pelvis 06/08/2021

CLINICAL DATA: Abnormal CT abdomen demonstrating a 14 mm diameter
density at the base of the RIGHT middle lobe, incompletely
characterized

EXAM:
CT CHEST WITHOUT CONTRAST
TECHNIQUE: Multidetector CT imaging of the chest was performed following the
standard protocol without IV contrast. Sagittal and coronal MPR
images reconstructed from axial data set.

[Series 2: thorax · axial · 0.73mm/px · z∈[-218,+14]mm · 12 of 138 slices shown, 15 images]
[im 11/138  mediastinal]
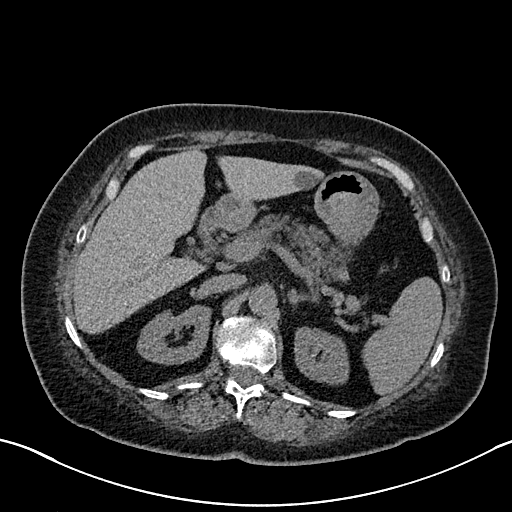
[im 11/138  lung]
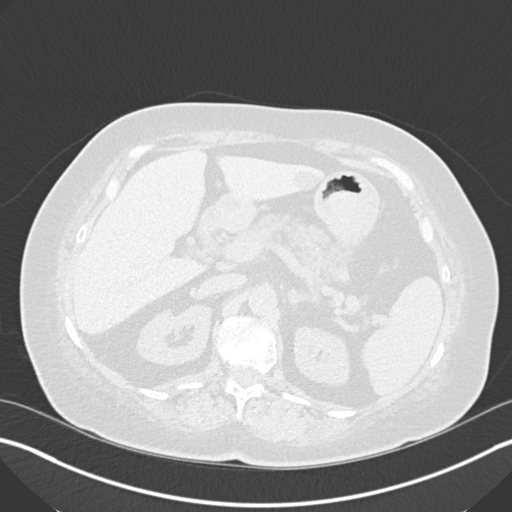
[im 22/138  lung]
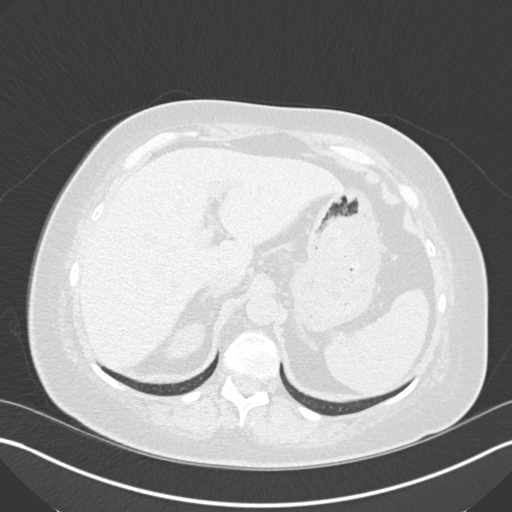
[im 32/138  lung]
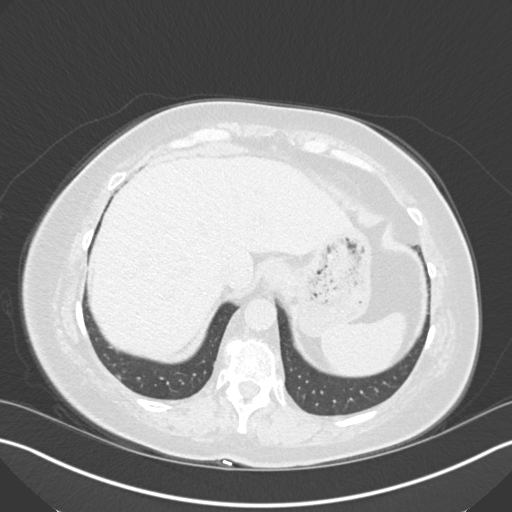
[im 43/138  lung]
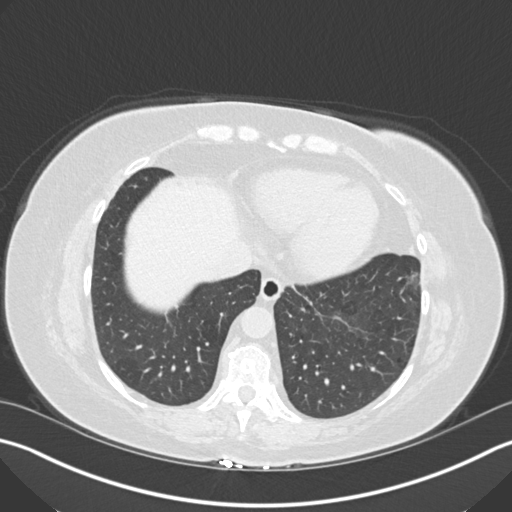
[im 53/138  mediastinal]
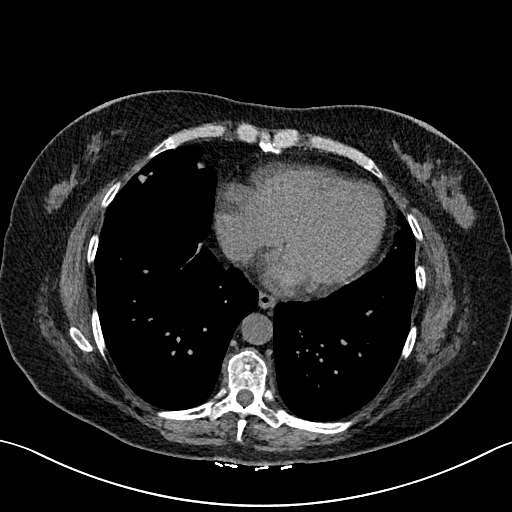
[im 53/138  lung]
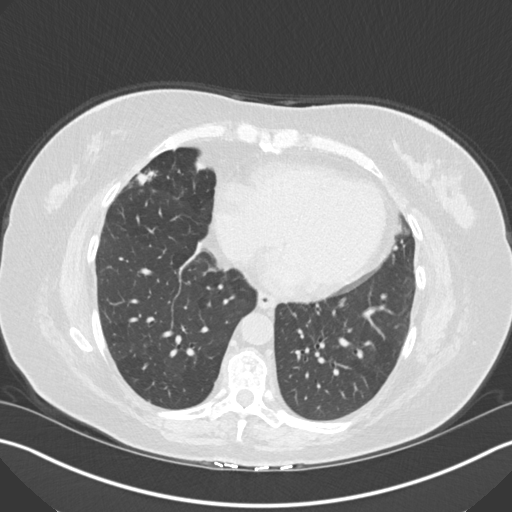
[im 64/138  lung]
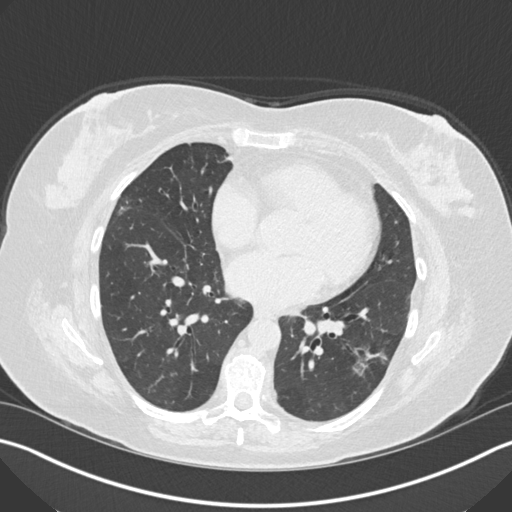
[im 74/138  lung]
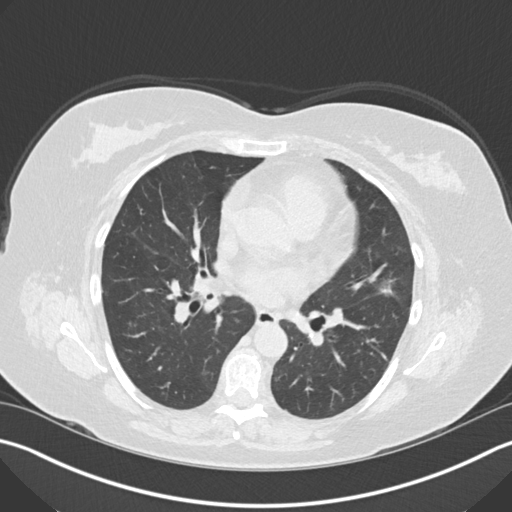
[im 85/138  lung]
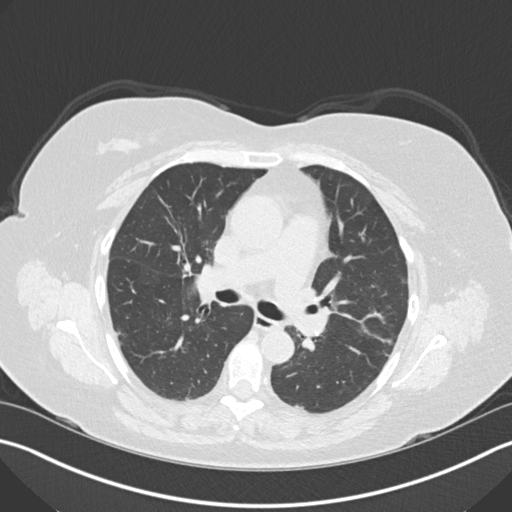
[im 95/138  mediastinal]
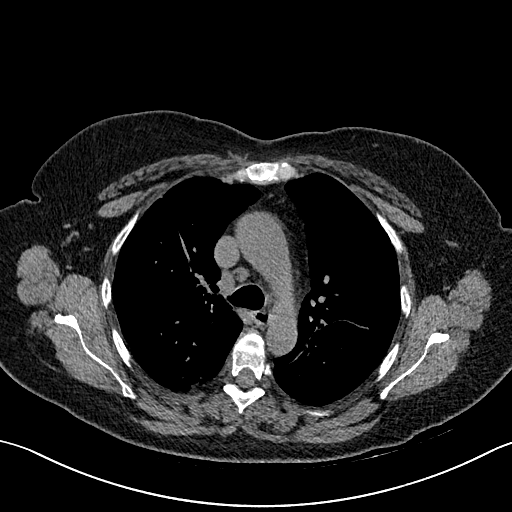
[im 95/138  lung]
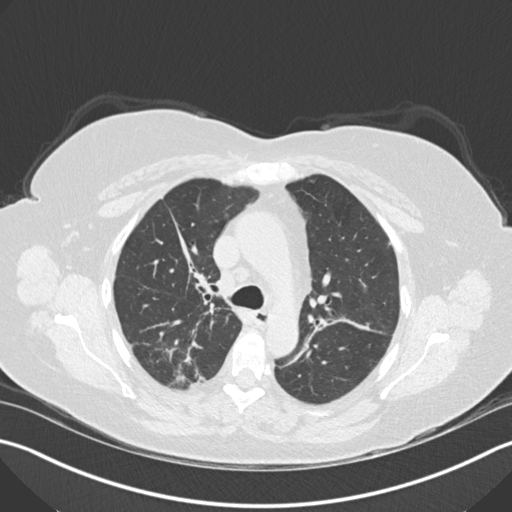
[im 106/138  lung]
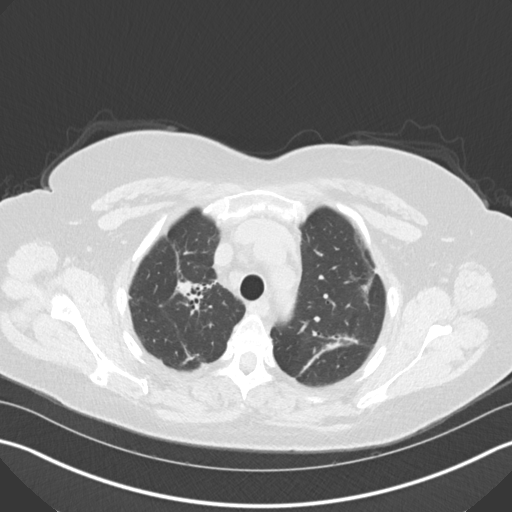
[im 116/138  lung]
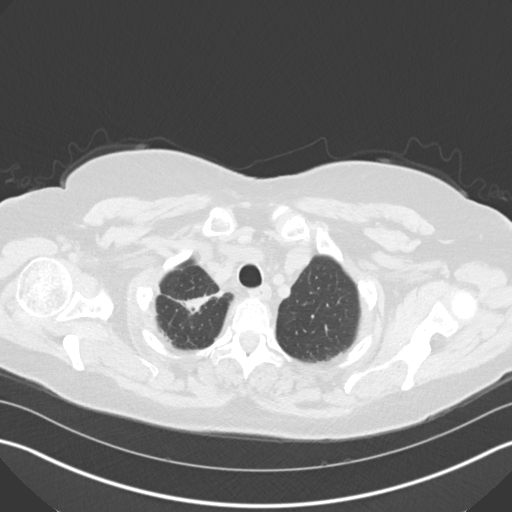
[im 127/138  lung]
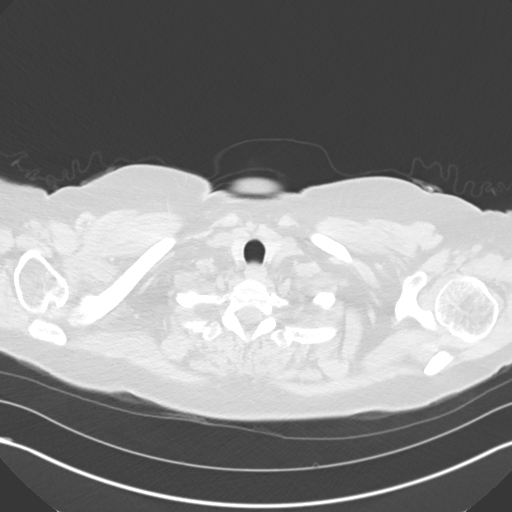

[Series 5: coronal · coronal · 0.56mm/px · 3 of 125 slices shown]
[im 25/125  lung]
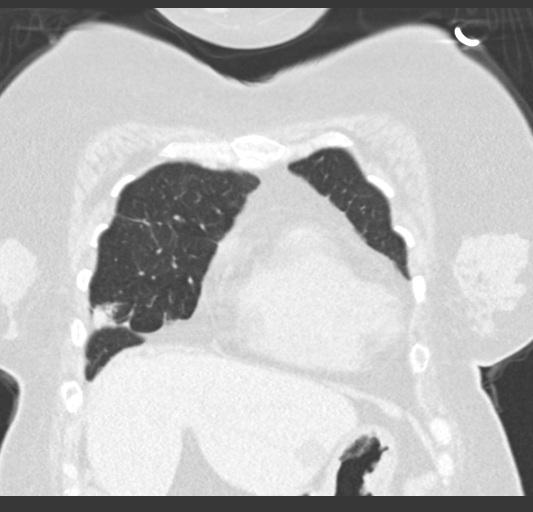
[im 50/125  lung]
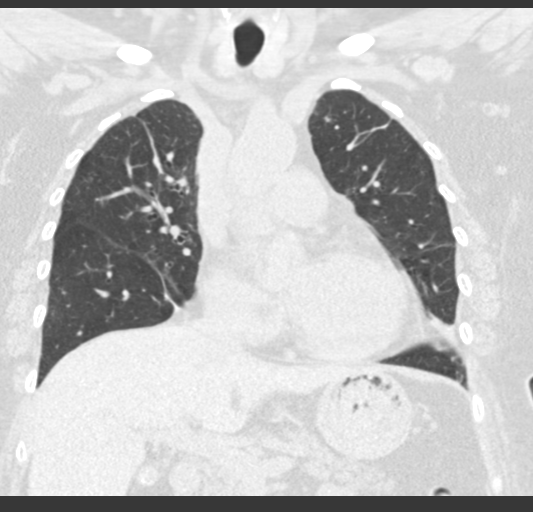
[im 75/125  lung]
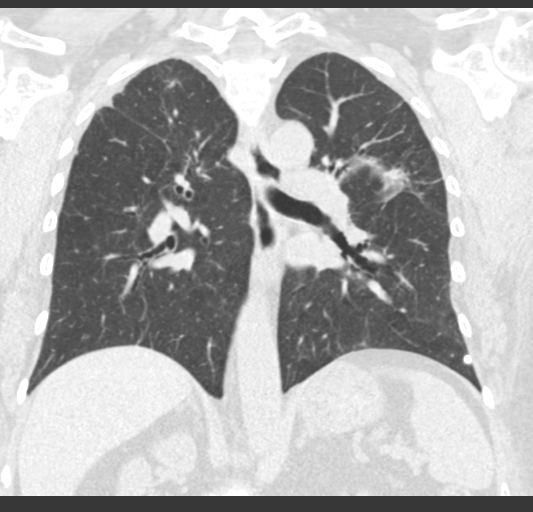

[15 of 36 positions shown; findings below may reference images not displayed]

FINDINGS: Cardiovascular: Ascending thoracic aorta upper normal caliber.
Atherosclerotic calcifications of upper abdominal aorta. Heart size
normal. No pericardial effusion.

Mediastinum/Nodes: Esophagus unremarkable. Base of cervical region
normal appearance. Scattered normal size mediastinal lymph nodes. No
thoracic adenopathy. Few calcified LEFT hilar nodes consistent with
old granulomatous disease.

Lungs/Pleura: Scattered areas of subsegmental atelectasis versus
scarring in both lungs. Focus of subsegmental atelectasis in RIGHT
upper lobe, slightly thickened and irregular. Focus of irregular
opacity 14 mm at the base of the RIGHT middle lobe corresponding to
previous CT finding, favor atelectasis. 8 mm subsolid density RIGHT
lower lobe anteriorly. 7 mm questionable nodular density RIGHT upper
lobe. No pulmonary infiltrate, pleural effusion, or pneumothorax.
Calcified subpleural granuloma LEFT lower lobe image 99.

Upper Abdomen: Contracted gallbladder containing multiple calculi.
Cyst at lateral segment LEFT lobe liver 20 x 15 mm image 127.
Remaining visualized upper abdomen unremarkable.

Musculoskeletal: Mild superior endplate compression deformities of
several thoracic vertebra, appear old. No acute osseous findings.
IMPRESSION: Scattered areas of subsegmental atelectasis versus scarring in both
lungs.

Old granulomatous disease changes.

Focus of irregular opacity 14 mm diameter at the base of the RIGHT
middle lobe corresponding to the previous CT finding, favor
atelectasis though underlying nodule not completely excluded.

8 mm subsolid opacity in the RIGHT lower lobe with an additional 7
mm questionable nodular focus in the RIGHT upper lobe.

Non-contrast chest CT at 3-6 months is recommended. If nodules
persist, subsequent management will be based upon the most
suspicious nodule(s). This recommendation follows the consensus
statement: Guidelines for Management of Incidental Pulmonary Nodules
Detected on CT Images: From the [HOSPITAL] 9744; Radiology
9744; [DATE].

Cholelithiasis.

Hepatic cyst.

Aortic Atherosclerosis ([id number]).

## 2023-05-09 DIAGNOSIS — C678 Malignant neoplasm of overlapping sites of bladder: Secondary | ICD-10-CM | POA: Diagnosis not present

## 2023-06-24 ENCOUNTER — Telehealth: Payer: Self-pay | Admitting: Pulmonary Disease

## 2023-06-24 DIAGNOSIS — G4733 Obstructive sleep apnea (adult) (pediatric): Secondary | ICD-10-CM

## 2023-06-24 NOTE — Telephone Encounter (Signed)
PT needs CPAP supplies called in to [employer] because they now insure her.   Hose and nose piece (Pillow) that wraps around her head and reservoir as well.   Her number is [phone]

## 2023-06-28 NOTE — Telephone Encounter (Signed)
Order placed

## 2023-07-02 DIAGNOSIS — N952 Postmenopausal atrophic vaginitis: Secondary | ICD-10-CM | POA: Diagnosis not present

## 2023-07-02 DIAGNOSIS — M8580 Other specified disorders of bone density and structure, unspecified site: Secondary | ICD-10-CM | POA: Diagnosis not present

## 2023-07-02 DIAGNOSIS — E2839 Other primary ovarian failure: Secondary | ICD-10-CM | POA: Diagnosis not present

## 2023-07-02 DIAGNOSIS — Z01419 Encounter for gynecological examination (general) (routine) without abnormal findings: Secondary | ICD-10-CM | POA: Diagnosis not present

## 2023-07-02 DIAGNOSIS — N951 Menopausal and female climacteric states: Secondary | ICD-10-CM | POA: Diagnosis not present

## 2023-07-05 ENCOUNTER — Other Ambulatory Visit: Payer: Self-pay | Admitting: Internal Medicine

## 2023-07-05 ENCOUNTER — Ambulatory Visit (INDEPENDENT_AMBULATORY_CARE_PROVIDER_SITE_OTHER): Payer: Medicare HMO | Admitting: Internal Medicine

## 2023-07-05 ENCOUNTER — Ambulatory Visit: Payer: Medicare HMO | Admitting: Urgent Care

## 2023-07-05 VITALS — BP 144/94 | HR 90 | Temp 98.3°F | Resp 16 | Ht 63.0 in | Wt 192.0 lb

## 2023-07-05 DIAGNOSIS — I10 Essential (primary) hypertension: Secondary | ICD-10-CM | POA: Diagnosis not present

## 2023-07-05 MED ORDER — LOSARTAN POTASSIUM 50 MG PO TABS: 50.0000 mg | ORAL_TABLET | Freq: Every day | ORAL | 1 refills | Status: DC

## 2023-07-05 MED ORDER — METOPROLOL SUCCINATE ER 25 MG PO TB24: 25.0000 mg | ORAL_TABLET | Freq: Every day | ORAL | 3 refills | Status: DC

## 2023-07-05 NOTE — Progress Notes (Unsigned)
Subjective:    Patient ID: Angel Williamson, female    DOB: [birth year], 67 y.o.   MRN: [id number]  DOS:  07/05/2023 Type of visit - description: Acute  Her BP has been elevated over the last 10 days, as high as 158/100. A week ago, she self increase losartan from 25 mg daily to 50 mg qd. Since then BPs are better but not completely well.  Admits to some stress for few months, taking care of her mother. Her son has COVID, she has been somewhat distressed about it.  Denies headache, no chest pain. No difficulty breathing or lower extremity edema. No NSAIDs. Admits to some increase in  salt intake.  Review of Systems See above   Past Medical History:  Diagnosis Date   Anxiety    Bladder cancer [facility])    urologist-- dr Marlou Porch   Breast fibrocystic disorder    History of adenomatous polyp of colon    History of COVID-19 03/2021   per pt positive home test ,  mild symptoms that resolved   Hypertension    followed by pcp    (ETT in epic 07-09-2014 normal)   Lung nodule    followed by dr Craige Cotta   OSA on CPAP    followed by dr Craige Cotta--- study in epic 08-07-20107 severe osa   Osteopenia    Renal cyst, left    Varicose veins of both lower extremities     Past Surgical History:  Procedure Laterality Date   COLONOSCOPY  2013   TRANSURETHRAL RESECTION OF BLADDER TUMOR WITH MITOMYCIN-C Bilateral 07/06/2021   Procedure: TRANSURETHRAL RESECTION OF BLADDER TUMOR BILATERAL RETROGRADE PYELOGRAM WITH GEMCITABINE INSTILLATION IN PACU;  Surgeon: Crist Fat, MD;  Location[facility];  Service: Urology;  Laterality: Bilateral;   TRANSURETHRAL RESECTION OF BLADDER TUMOR WITH MITOMYCIN-C Bilateral 09/07/2022   Procedure: TRANSURETHRAL RESECTION OF BLADDER TUMOR BIOPSY, BILATERAL RETROGRADE PYELOGRAMS;  Surgeon: Crist Fat, MD;  Location: [facility];  Service: Urology;  Laterality: Bilateral;    Current Outpatient Medications  Medication  Instructions   Calcium Carbonate (CALCIUM 500 PO) 1,000 mg, Oral, Daily   Cholecalciferol (D3 ADULT PO) 2,000 Units, Oral, Daily   EPINEPHrine 0.3 mg/0.3 mL IJ SOAJ injection Use as directed for life-threatening allergic reaction.   fluticasone (FLONASE) 50 MCG/ACT nasal spray 1 spray, Each Nare, Nightly   LORazepam (ATIVAN) 0.5 mg, Oral, Every 8 hours PRN   losartan (COZAAR) 25 mg, Oral, Daily   traMADol (ULTRAM) 50-100 mg, Oral, Every 6 hours PRN       Objective:   Physical Exam BP (!) 144/94   Pulse 90   Temp 98.3 F (36.8 C) (Oral)   Resp 16   Ht 5\' 3"  (1.6 m)   Wt 192 lb (87.1 kg)   SpO2 98%   BMI 34.01 kg/m  General:   Well developed, NAD, BMI noted.  HEENT:  Normocephalic . Face symmetric, atraumatic Lungs:  CTA B Normal respiratory effort, no intercostal retractions, no accessory muscle use. Heart: RRR,  no murmur.  Abdomen:  Not distended, soft, non-tender. No rebound or rigidity.  No bruit Skin: Not pale. Not jaundice Lower extremities: no pretibial edema bilaterally  Neurologic:  alert & oriented X3.  Speech normal, gait appropriate for age and unassisted Psych--  Cognition and judgment appear intact.  Cooperative with normal attention span and concentration.  Behavior appropriate. No anxious or depressed appearing.     Assessment    67 year old female,  PMH includes HTN, osteopenia, OSA on CPAP (good compliance), bladder cancer, presents with HTN: BP readings elevated for the last 10 days, self increase losartan from 25 mg to 50 mg a day. BP readings better but not at goal. Admits to some stress. Renal function is normal. Plan: Continue losartan 50 mg daily noting that it is in her allergies list but currently she is not having any problems. Add metoprolol ER 25 mg 1 tablet daily. Watch salt intake. Monitor BPs. BMP today. See PCP in 2 weeks, sooner if needed.

## 2023-07-05 NOTE — Patient Instructions (Signed)
Continue taking losartan 50 mg a day.  Metoprolol ER 25 mg: 1 tablet every day. This is a low dose, can be titrated up as needed.   Check the  blood pressure regularly Blood pressure goal:  between 110/65 and  135/85. If it is consistently higher or lower, let me know    GO TO THE LAB : Get the blood work    Please schedule office visit with Dr. Lawerance Bach in 2 weeks. Call sooner if needed.

## 2023-07-06 LAB — BASIC METABOLIC PANEL
BUN: 17 mg/dL (ref 7–25)
CO2: 29 mmol/L (ref 20–32)
Calcium: 9.9 mg/dL (ref 8.6–10.4)
Chloride: 104 mmol/L (ref 98–110)
Creat: 0.96 mg/dL (ref 0.50–1.05)
Glucose, Bld: 112 mg/dL — ABNORMAL HIGH (ref 65–99)
Potassium: 4.3 mmol/L (ref 3.5–5.3)
Sodium: 142 mmol/L (ref 135–146)

## 2023-07-08 ENCOUNTER — Other Ambulatory Visit: Payer: Self-pay | Admitting: Obstetrics and Gynecology

## 2023-07-08 DIAGNOSIS — Z1231 Encounter for screening mammogram for malignant neoplasm of breast: Secondary | ICD-10-CM

## 2023-07-08 DIAGNOSIS — M8589 Other specified disorders of bone density and structure, multiple sites: Secondary | ICD-10-CM

## 2023-07-17 NOTE — Patient Instructions (Incomplete)
     Call to schedule your colonoscopy [facility] Phone: [phone]     Medications changes include :   none    See sports medicine if needed for your sign pain - [facility] - [phone]     Return in about 7 months (around 02/15/2024) for Physical Exam.

## 2023-07-17 NOTE — Progress Notes (Unsigned)
Subjective:    Patient ID: Angel Williamson, female    DOB: 10-Apr-1956, 67 y.o.   MRN: [id number]     HPI Shasha is here for follow up of her chronic medical problems.  BP was elevated at home - she increased losartan to 50 mg daily - BP's were better.   Stress was increased.  Increased salt intake.   Saw Dr Drue Novel 9/6 -BP still elevated - metoprolol ER 25 mg daily started ---  here for follow up   Doing well - BP well controlled at home.  No side effects from new medication.    Left side pain - worse with picking a lot of things or wearing a purse on her left shoudler.    Medications and allergies reviewed with patient and updated if appropriate.  Current Outpatient Medications on File Prior to Visit  Medication Sig Dispense Refill   Calcium Carbonate (CALCIUM 500 PO) Take 1,000 mg by mouth daily.     Cholecalciferol (D3 ADULT PO) Take 2,000 Units by mouth daily.     EPINEPHrine 0.3 mg/0.3 mL IJ SOAJ injection Use as directed for life-threatening allergic reaction. 2 each 3   fluticasone (FLONASE) 50 MCG/ACT nasal spray Place 1 spray into both nostrils at bedtime. 16 g 2   LORazepam (ATIVAN) 0.5 MG tablet Take 1 tablet (0.5 mg total) by mouth every 8 (eight) hours as needed for anxiety. 30 tablet 2   losartan (COZAAR) 50 MG tablet Take 1 tablet (50 mg total) by mouth daily. 30 tablet 1   metoprolol succinate (TOPROL-XL) 25 MG 24 hr tablet Take 1 tablet (25 mg total) by mouth daily. 30 tablet 3   Current Facility-Administered Medications on File Prior to Visit  Medication Dose Route Frequency Provider Last Rate Last Admin   gemcitabine (GEMZAR) chemo syringe for bladder instillation 2,000 mg  2,000 mg Bladder Instillation Once Crist Fat, MD         Review of Systems  Constitutional:  Negative for fever.  Respiratory:  Negative for cough, shortness of breath and wheezing.   Cardiovascular:  Negative for chest pain, palpitations and leg swelling.  Neurological:   Negative for dizziness, light-headedness and headaches.       Objective:   Vitals:   07/18/23 0824  BP: 136/72  Pulse: 64  Temp: 98.2 F (36.8 C)  SpO2: 98%   BP Readings from Last 3 Encounters:  07/18/23 136/72  07/05/23 (!) 144/94  02/12/23 122/76   Wt Readings from Last 3 Encounters:  07/18/23 193 lb (87.5 kg)  07/05/23 192 lb (87.1 kg)  03/12/23 185 lb (83.9 kg)   Body mass index is 34.19 kg/m.    Physical Exam Constitutional:      General: She is not in acute distress.    Appearance: Normal appearance.  HENT:     Head: Normocephalic and atraumatic.  Eyes:     Conjunctiva/sclera: Conjunctivae normal.  Cardiovascular:     Rate and Rhythm: Normal rate and regular rhythm.     Heart sounds: Normal heart sounds.  Pulmonary:     Effort: Pulmonary effort is normal. No respiratory distress.     Breath sounds: Normal breath sounds. No wheezing.  Musculoskeletal:        General: No tenderness (none on left lateral side right now.  no t-spine or mid back tenderness).     Cervical back: Neck supple.     Right lower leg: No edema.  Left lower leg: No edema.  Lymphadenopathy:     Cervical: No cervical adenopathy.  Skin:    General: Skin is warm and dry.     Findings: No rash.  Neurological:     Mental Status: She is alert. Mental status is at baseline.  Psychiatric:        Mood and Affect: Mood normal.        Behavior: Behavior normal.        Lab Results  Component Value Date   WBC 4.5 02/12/2023   HGB 12.4 02/12/2023   HCT 36.3 02/12/2023   PLT 263.0 02/12/2023   GLUCOSE 112 (H) 07/05/2023   CHOL 149 02/12/2023   TRIG 113.0 02/12/2023   HDL 51.40 02/12/2023   LDLCALC 75 02/12/2023   ALT 17 02/12/2023   AST 15 02/12/2023   NA 142 07/05/2023   K 4.3 07/05/2023   CL 104 07/05/2023   CREATININE 0.96 07/05/2023   BUN 17 07/05/2023   CO2 29 07/05/2023   TSH 0.94 02/12/2023   HGBA1C 6.2 02/12/2023     Assessment & Plan:    See Problem List  for Assessment and Plan of chronic medical problems.

## 2023-07-18 ENCOUNTER — Encounter: Payer: Self-pay | Admitting: Internal Medicine

## 2023-07-18 ENCOUNTER — Ambulatory Visit (INDEPENDENT_AMBULATORY_CARE_PROVIDER_SITE_OTHER): Payer: Medicare HMO | Admitting: Internal Medicine

## 2023-07-18 VITALS — BP 136/72 | HR 64 | Temp 98.2°F | Ht 63.0 in | Wt 193.0 lb

## 2023-07-18 DIAGNOSIS — F419 Anxiety disorder, unspecified: Secondary | ICD-10-CM | POA: Diagnosis not present

## 2023-07-18 DIAGNOSIS — R109 Unspecified abdominal pain: Secondary | ICD-10-CM | POA: Diagnosis not present

## 2023-07-18 DIAGNOSIS — I10 Essential (primary) hypertension: Secondary | ICD-10-CM | POA: Diagnosis not present

## 2023-07-18 DIAGNOSIS — Z1211 Encounter for screening for malignant neoplasm of colon: Secondary | ICD-10-CM

## 2023-07-18 MED ORDER — METOPROLOL SUCCINATE ER 25 MG PO TB24: 25.0000 mg | ORAL_TABLET | Freq: Every day | ORAL | 2 refills | Status: DC

## 2023-07-18 MED ORDER — LORAZEPAM 0.5 MG PO TABS: 0.5000 mg | ORAL_TABLET | Freq: Three times a day (TID) | ORAL | 2 refills | Status: DC | PRN

## 2023-07-18 MED ORDER — LOSARTAN POTASSIUM 50 MG PO TABS: 50.0000 mg | ORAL_TABLET | Freq: Every day | ORAL | 1 refills | Status: DC

## 2023-07-18 NOTE — Assessment & Plan Note (Signed)
Chronic Left latera side pain - burning sensation Worse with carrying a bad on her left shoulder or picking things up - certain activities Often no pain at rest, laying down Sounds like nerve pain Has improved but still present Has had imaging in last year  - Ct Chest Can see sports med if pain persists for reassurance Does not need anything for pain

## 2023-07-18 NOTE — Assessment & Plan Note (Signed)
Chronic Blood pressure well-controlled here today and has been controlled at home Doing well with new medication Continue losartan 50 mg daily and metoprolol xl 25 mg daily

## 2023-07-18 NOTE — Assessment & Plan Note (Signed)
Chronic situational Improved since mom is doing better and BP is controlled Does not feel she needs any additional medication Continue Ativan 0.25-0.5 mg every 8 hours as needed

## 2023-07-23 ENCOUNTER — Encounter: Payer: Self-pay | Admitting: Gastroenterology

## 2023-08-12 DIAGNOSIS — D303 Benign neoplasm of bladder: Secondary | ICD-10-CM | POA: Diagnosis not present

## 2023-08-12 DIAGNOSIS — C678 Malignant neoplasm of overlapping sites of bladder: Secondary | ICD-10-CM | POA: Diagnosis not present

## 2023-08-22 ENCOUNTER — Ambulatory Visit: Payer: Medicare HMO

## 2023-08-22 ENCOUNTER — Encounter: Payer: Self-pay | Admitting: Gastroenterology

## 2023-08-22 VITALS — Ht 63.0 in | Wt 190.0 lb

## 2023-08-22 DIAGNOSIS — Z860100 Personal history of colon polyps, unspecified: Secondary | ICD-10-CM

## 2023-08-22 MED ORDER — NA SULFATE-K SULFATE-MG SULF 17.5-3.13-1.6 GM/177ML PO SOLN: 1.0000 | Freq: Once | ORAL | 0 refills | Status: AC

## 2023-08-22 NOTE — Progress Notes (Signed)

## 2023-09-12 ENCOUNTER — Encounter: Payer: Self-pay | Admitting: Gastroenterology

## 2023-09-12 ENCOUNTER — Ambulatory Visit: Payer: Medicare HMO | Admitting: Gastroenterology

## 2023-09-12 VITALS — BP 125/60 | HR 58 | Temp 97.9°F | Resp 10 | Ht 63.0 in | Wt 190.0 lb

## 2023-09-12 DIAGNOSIS — Z860100 Personal history of colon polyps, unspecified: Secondary | ICD-10-CM | POA: Diagnosis not present

## 2023-09-12 DIAGNOSIS — K635 Polyp of colon: Secondary | ICD-10-CM | POA: Diagnosis not present

## 2023-09-12 DIAGNOSIS — D125 Benign neoplasm of sigmoid colon: Secondary | ICD-10-CM | POA: Diagnosis not present

## 2023-09-12 DIAGNOSIS — F419 Anxiety disorder, unspecified: Secondary | ICD-10-CM | POA: Diagnosis not present

## 2023-09-12 DIAGNOSIS — Z09 Encounter for follow-up examination after completed treatment for conditions other than malignant neoplasm: Secondary | ICD-10-CM

## 2023-09-12 DIAGNOSIS — G4733 Obstructive sleep apnea (adult) (pediatric): Secondary | ICD-10-CM | POA: Diagnosis not present

## 2023-09-12 DIAGNOSIS — Z1211 Encounter for screening for malignant neoplasm of colon: Secondary | ICD-10-CM | POA: Diagnosis not present

## 2023-09-12 MED ORDER — SODIUM CHLORIDE 0.9 % IV SOLN: 500.0000 mL | Freq: Once | INTRAVENOUS | Status: DC

## 2023-09-12 NOTE — Progress Notes (Signed)
[facility] History and Physical   Primary Care Physician:  Pincus Sanes, MD   Reason for Procedure:  History of adenomatous colon polyps  Plan:    Surveillance colonoscopy with possible interventions as needed     HPI: Angel Williamson is a very pleasant 67 y.o. female here for surveillance colonoscopy. Denies any nausea, vomiting, abdominal pain, melena or bright red blood per rectum  The risks and benefits as well as alternatives of endoscopic procedure(s) have been discussed and reviewed. All questions answered. The patient agrees to proceed.    Past Medical History:  Diagnosis Date   Anxiety    Bladder cancer [facility])    urologist-- dr Marlou Porch   Breast fibrocystic disorder    History of adenomatous polyp of colon    History of COVID-19 03/2021   per pt positive home test ,  mild symptoms that resolved   Hypertension    followed by pcp    (ETT in epic 07-09-2014 normal)   Lung nodule    followed by dr Craige Cotta   OSA on CPAP    followed by dr Craige Cotta--- study in epic 08-07-20107 severe osa   Osteopenia    Renal cyst, left    Sleep apnea    Varicose veins of both lower extremities     Past Surgical History:  Procedure Laterality Date   COLONOSCOPY  2013   TRANSURETHRAL RESECTION OF BLADDER TUMOR WITH MITOMYCIN-C Bilateral 07/06/2021   Procedure: TRANSURETHRAL RESECTION OF BLADDER TUMOR BILATERAL RETROGRADE PYELOGRAM WITH GEMCITABINE INSTILLATION IN PACU;  Surgeon: Crist Fat, MD;  Location: [facility];  Service: Urology;  Laterality: Bilateral;   TRANSURETHRAL RESECTION OF BLADDER TUMOR WITH MITOMYCIN-C Bilateral 09/07/2022   Procedure: TRANSURETHRAL RESECTION OF BLADDER TUMOR BIOPSY, BILATERAL RETROGRADE PYELOGRAMS;  Surgeon: Crist Fat, MD;  Location: [facility];  Service: Urology;  Laterality: Bilateral;    Prior to Admission medications   Medication Sig Start Date End Date Taking? Authorizing Provider   Cholecalciferol (D3 ADULT PO) Take 2,000 Units by mouth daily.   Yes [provider]  fluticasone (FLONASE) 50 MCG/ACT nasal spray Place 1 spray into both nostrils at bedtime. 01/16/22  Yes Coralyn Helling, MD  LORazepam (ATIVAN) 0.5 MG tablet Take 1 tablet (0.5 mg total) by mouth every 8 (eight) hours as needed for anxiety. 07/18/23  Yes Burns, Bobette Mo, MD  losartan (COZAAR) 50 MG tablet Take 1 tablet (50 mg total) by mouth daily. 07/18/23  Yes Burns, Bobette Mo, MD  metoprolol succinate (TOPROL-XL) 25 MG 24 hr tablet Take 1 tablet (25 mg total) by mouth daily. 07/18/23  Yes Burns, Bobette Mo, MD  Calcium Carbonate (CALCIUM 500 PO) Take 1,000 mg by mouth daily.    [provider]  EPINEPHrine 0.3 mg/0.3 mL IJ SOAJ injection Use as directed for life-threatening allergic reaction. 02/28/21   Marcelyn Bruins, MD  estradiol (ESTRACE) 0.1 MG/GM vaginal cream Insert one gram in vagina daily for 14 days and then 2x weekly 07/02/23   [provider]    Current Outpatient Medications  Medication Sig Dispense Refill   Cholecalciferol (D3 ADULT PO) Take 2,000 Units by mouth daily.     fluticasone (FLONASE) 50 MCG/ACT nasal spray Place 1 spray into both nostrils at bedtime. 16 g 2   LORazepam (ATIVAN) 0.5 MG tablet Take 1 tablet (0.5 mg total) by mouth every 8 (eight) hours as needed for anxiety. 30 tablet 2   losartan (COZAAR) 50 MG tablet  Take 1 tablet (50 mg total) by mouth daily. 90 tablet 1   metoprolol succinate (TOPROL-XL) 25 MG 24 hr tablet Take 1 tablet (25 mg total) by mouth daily. 90 tablet 2   Calcium Carbonate (CALCIUM 500 PO) Take 1,000 mg by mouth daily.     EPINEPHrine 0.3 mg/0.3 mL IJ SOAJ injection Use as directed for life-threatening allergic reaction. 2 each 3   estradiol (ESTRACE) 0.1 MG/GM vaginal cream Insert one gram in vagina daily for 14 days and then 2x weekly     Current Facility-Administered Medications  Medication Dose Route Frequency Provider Last Rate  Last Admin   0.9 %  sodium chloride infusion  500 mL Intravenous Once Keyara Ent, Eleonore Chiquito, MD       Facility-Administered Medications Ordered in Other Visits  Medication Dose Route Frequency Provider Last Rate Last Admin   gemcitabine (GEMZAR) chemo syringe for bladder instillation 2,000 mg  2,000 mg Bladder Instillation Once Crist Fat, MD        Allergies as of 09/12/2023 - Review Complete 09/12/2023  Allergen Reaction Noted   Hydrochlorothiazide     Amlodipine  07/12/2021   Losartan potassium  02/28/2021   Phenazopyridine Other (See Comments) 01/24/2023   Septra [sulfamethoxazole-trimethoprim] Swelling 01/24/2023   Shellfish allergy Hives 07/22/2017   Spironolactone Other (See Comments) 02/28/2021   Benazepril hcl      Family History  Problem Relation Age of Onset   Anemia Mother    Colon polyps Father    Diabetes Maternal Grandfather    Cancer Paternal Grandmother        stomach   Stomach cancer Paternal Grandmother    Stroke Paternal Grandfather 22   Asthma Son    Anemia Other        Cousin    Colon cancer Neg Hx    Esophageal cancer Neg Hx    Rectal cancer Neg Hx     Social History   Socioeconomic History   Marital status: Married    Spouse name: Not on file   Number of children: 2   Years of education: Not on file   Highest education level: Associate degree: academic program  Occupational History   Occupation: Government social research officer    Comment: [employer]  Tobacco Use   Smoking status: Former    Current packs/day: 0.00    Average packs/day: 1 pack/day for 15.0 years (15.0 ttl pk-yrs)    Types: Cigarettes    Start date: 83    Quit date: 1988    Years since quitting: 36.8   Smokeless tobacco: Never  Vaping Use   Vaping status: Never Used  Substance and Sexual Activity   Alcohol use: Never   Drug use: Never   Sexual activity: Not on file  Other Topics Concern   Not on file  Social History Narrative   Lives with husband   Caffeine none since  2015   Social Determinants of Health   Financial Resource Strain: Low Risk  (07/05/2023)   Overall Financial Resource Strain (CARDIA)    Difficulty of Paying Living Expenses: Not hard at all  Food Insecurity: No Food Insecurity (07/05/2023)   Hunger Vital Sign    Worried About Running Out of Food in the Last Year: Never true    Ran Out of Food in the Last Year: Never true  Transportation Needs: No Transportation Needs (07/05/2023)   PRAPARE - Administrator, Civil Service (Medical): No    Lack of Transportation (Non-Medical): No  Physical Activity: Insufficiently Active (07/05/2023)   Exercise Vital Sign    Days of Exercise per Week: 3 days    Minutes of Exercise per Session: 30 min  Stress: No Stress Concern Present (07/05/2023)   [employer] of Occupational Health - Occupational Stress Questionnaire    Feeling of Stress : Only a little  Social Connections: Socially Integrated (07/05/2023)   Social Connection and Isolation Panel [NHANES]    Frequency of Communication with Friends and Family: Patient declined    Frequency of Social Gatherings with Friends and Family: Three times a week    Attends Religious Services: More than 4 times per year    Active Member of Clubs or Organizations: Yes    Attends Banker Meetings: Patient declined    Marital Status: Married  Catering manager Violence: Not At Risk (03/12/2023)   Humiliation, Afraid, Rape, and Kick questionnaire    Fear of Current or Ex-Partner: No    Emotionally Abused: No    Physically Abused: No    Sexually Abused: No    Review of Systems:  All other review of systems negative except as mentioned in the HPI.  Physical Exam: Vital signs in last 24 hours: BP (!) 154/95   Pulse 71   Temp 97.9 F (36.6 C)   Resp 16   Ht 5\' 3"  (1.6 m)   Wt 190 lb (86.2 kg)   SpO2 100%   BMI 33.66 kg/m  General:   Alert, NAD Lungs:  Clear .   Heart:  Regular rate and rhythm Abdomen:  Soft, nontender and  nondistended. Neuro/Psych:  Alert and cooperative. Normal mood and affect. A and O x 3  Reviewed labs, radiology imaging, old records and pertinent past GI work up  Patient is appropriate for planned procedure(s) and anesthesia in an ambulatory setting   K. Scherry Ran , MD [phone]

## 2023-09-12 NOTE — Progress Notes (Signed)
Called to room to assist during endoscopic procedure.  Patient ID and intended procedure confirmed with present staff. Received instructions for my participation in the procedure from the performing physician.  

## 2023-09-12 NOTE — Patient Instructions (Signed)
-   Resume previous diet. - Continue present medications. - Await pathology results. - Repeat colonoscopy in 5-10 years for surveillance based on pathology results.   YOU HAD AN ENDOSCOPIC PROCEDURE TODAY AT THE [facility]:   Refer to the procedure report that was given to you for any specific questions about what was found during the examination.  If the procedure report does not answer your questions, please call your gastroenterologist to clarify.  If you requested that your care partner not be given the details of your procedure findings, then the procedure report has been included in a sealed envelope for you to review at your convenience later.  YOU SHOULD EXPECT: Some feelings of bloating in the abdomen. Passage of more gas than usual.  Walking can help get rid of the air that was put into your GI tract during the procedure and reduce the bloating. If you had a lower endoscopy (such as a colonoscopy or flexible sigmoidoscopy) you may notice spotting of blood in your stool or on the toilet paper. If you underwent a bowel prep for your procedure, you may not have a normal bowel movement for a few days.  Please Note:  You might notice some irritation and congestion in your nose or some drainage.  This is from the oxygen used during your procedure.  There is no need for concern and it should clear up in a day or so.  SYMPTOMS TO REPORT IMMEDIATELY:  Following lower endoscopy (colonoscopy or flexible sigmoidoscopy):  Excessive amounts of blood in the stool  Significant tenderness or worsening of abdominal pains  Swelling of the abdomen that is new, acute  Fever of 100F or higher  For urgent or emergent issues, a gastroenterologist can be reached at any hour by calling [phone]. Do not use MyChart messaging for urgent concerns.    DIET:  We do recommend a small meal at first, but then you may proceed to your regular diet.  Drink plenty of fluids but you should avoid  alcoholic beverages for 24 hours.  ACTIVITY:  You should plan to take it easy for the rest of today and you should NOT DRIVE or use heavy machinery until tomorrow (because of the sedation medicines used during the test).    FOLLOW UP: Our staff will call the number listed on your records the next business day following your procedure.  We will call around 7:15- 8:00 am to check on you and address any questions or concerns that you may have regarding the information given to you following your procedure. If we do not reach you, we will leave a message.     If any biopsies were taken you will be contacted by phone or by letter within the next 1-3 weeks.  Please call us at [phone] if you have not heard about the biopsies in 3 weeks.    SIGNATURES/CONFIDENTIALITY: You and/or your care partner have signed paperwork which will be entered into your electronic medical record.  These signatures attest to the fact that that the information above on your After Visit Summary has been reviewed and is understood.  Full responsibility of the confidentiality of this discharge information lies with you and/or your care-partner. 

## 2023-09-12 NOTE — Progress Notes (Signed)
Pt's states no medical or surgical changes since previsit or office visit. 

## 2023-09-12 NOTE — Op Note (Signed)
[facility] Patient Name: Angel Williamson Procedure Date: 09/12/2023 8:02 AM MRN: [id number] Endoscopist: Napoleon Form , MD, [id number] Age: 67 Referring MD:  Date of Birth: 1955-12-18 Gender: Female Account #: [id number] Procedure:                Colonoscopy Indications:              High risk colon cancer surveillance: Personal                            history of adenomatous colonic polyps in 2003 & 2007 Medicines:                Monitored Anesthesia Care Procedure:                Pre-Anesthesia Assessment:                           - Prior to the procedure, a History and Physical                            was performed, and patient medications and                            allergies were reviewed. The patient's tolerance of                            previous anesthesia was also reviewed. The risks                            and benefits of the procedure and the sedation                            options and risks were discussed with the patient.                            All questions were answered, and informed consent                            was obtained. Prior Anticoagulants: The patient has                            taken no anticoagulant or antiplatelet agents. ASA                            Grade Assessment: II - A patient with mild systemic                            disease. After reviewing the risks and benefits,                            the patient was deemed in satisfactory condition to                            undergo the procedure.  After obtaining informed consent, the colonoscope                            was passed under direct vision. Throughout the                            procedure, the patient's blood pressure, pulse, and                            oxygen saturations were monitored continuously. The                            [id number]-HQ190L Colonoscope [id number] was introduced                            through  the anus and advanced to the the cecum,                            identified by appendiceal orifice and ileocecal                            valve. The colonoscopy was performed without                            difficulty. The patient tolerated the procedure                            well. The quality of the bowel preparation was                            good. The ileocecal valve, appendiceal orifice, and                            rectum were photographed. Scope In: 8:08:37 AM Scope Out: 8:22:21 AM Scope Withdrawal Time: 0 hours 10 minutes 9 seconds  Total Procedure Duration: 0 hours 13 minutes 44 seconds  Findings:                 The perianal and digital rectal examinations were                            normal.                           A 7 mm polyp was found in the sigmoid colon. The                            polyp was sessile. The polyp was removed with a                            cold snare. Resection and retrieval were complete.                           A few small-mouthed diverticula were found in the  sigmoid colon and descending colon.                           Non-bleeding external and internal hemorrhoids were                            found during retroflexion. The hemorrhoids were                            small. Complications:            No immediate complications. Estimated Blood Loss:     Estimated blood loss was minimal. Impression:               - One 7 mm polyp in the sigmoid colon, removed with                            a cold snare. Resected and retrieved.                           - Diverticulosis in the sigmoid colon and in the                            descending colon.                           - Non-bleeding external and internal hemorrhoids. Recommendation:           - Patient has a contact number available for                            emergencies. The signs and symptoms of potential                             delayed complications were discussed with the                            patient. Return to normal activities tomorrow.                            Written discharge instructions were provided to the                            patient.                           - Resume previous diet.                           - Continue present medications.                           - Await pathology results.                           - Repeat colonoscopy in 5-10 years for surveillance  based on pathology results. Napoleon Form, MD 09/12/2023 8:31:00 AM This report has been signed electronically.

## 2023-09-12 NOTE — Progress Notes (Signed)
Sedate, gd SR, tolerated procedure well, VSS, report to RN 

## 2023-09-13 ENCOUNTER — Telehealth: Payer: Self-pay

## 2023-09-13 NOTE — Telephone Encounter (Signed)
lmom 

## 2023-09-16 LAB — SURGICAL PATHOLOGY

## 2023-09-19 ENCOUNTER — Ambulatory Visit: Payer: Medicare HMO

## 2023-09-23 ENCOUNTER — Other Ambulatory Visit: Payer: Self-pay | Admitting: *Deleted

## 2023-09-23 DIAGNOSIS — R911 Solitary pulmonary nodule: Secondary | ICD-10-CM

## 2023-10-04 ENCOUNTER — Encounter: Payer: Self-pay | Admitting: Gastroenterology

## 2023-10-28 ENCOUNTER — Ambulatory Visit
Admission: RE | Admit: 2023-10-28 | Discharge: 2023-10-28 | Disposition: A | Discharge status: Home / Self Care | Payer: Medicare HMO | Source: Ambulatory Visit | Attending: Obstetrics and Gynecology | Admitting: Obstetrics and Gynecology

## 2023-10-28 DIAGNOSIS — Z1231 Encounter for screening mammogram for malignant neoplasm of breast: Secondary | ICD-10-CM

## 2023-11-14 ENCOUNTER — Ambulatory Visit
Admission: RE | Admit: 2023-11-14 | Discharge: 2023-11-14 | Disposition: A | Discharge status: Home / Self Care | Payer: Medicare Other | Source: Ambulatory Visit | Attending: Adult Health | Admitting: Adult Health

## 2023-11-14 DIAGNOSIS — R911 Solitary pulmonary nodule: Secondary | ICD-10-CM

## 2023-12-03 ENCOUNTER — Ambulatory Visit (HOSPITAL_BASED_OUTPATIENT_CLINIC_OR_DEPARTMENT_OTHER): Payer: Medicare Other | Admitting: Pulmonary Disease

## 2023-12-03 ENCOUNTER — Encounter (HOSPITAL_BASED_OUTPATIENT_CLINIC_OR_DEPARTMENT_OTHER): Payer: Self-pay | Admitting: Pulmonary Disease

## 2023-12-03 VITALS — BP 128/82 | HR 78 | Ht 63.0 in | Wt 196.7 lb

## 2023-12-03 DIAGNOSIS — J479 Bronchiectasis, uncomplicated: Secondary | ICD-10-CM | POA: Diagnosis not present

## 2023-12-03 DIAGNOSIS — G4733 Obstructive sleep apnea (adult) (pediatric): Secondary | ICD-10-CM

## 2023-12-03 NOTE — Patient Instructions (Signed)
X CPAP supplies will be renewed  Auto settings are working well

## 2023-12-03 NOTE — Progress Notes (Signed)
 Subjective:    Patient ID: Angel Williamson, female    DOB: 04/17/1956, 68 y.o.   MRN: [id number]  HPI  68 y.o. female former smoker with obstructive sleep apnea and mild bronchiectasis 15.00 pack-year smoking history- quit 1988  PMH : HTN,  Bladder cancer    Discussed the use of AI scribe software for clinical note transcription with the patient, who gave verbal consent to proceed.  History of Present Illness   Angel Williamson, a long-term patient with a history of sleep apnea and lung nodules, presents for a follow-up visit. She reports no new health events or concerns since her last visit. She has been compliant with her CPAP machine for sleep apnea, which she reports is working well. She had a recent CT scan, the results of which she has reviewed. She has a history of smoking, but quit approximately forty years ago. She also had COVID-19 a couple of years ago. She has been followed for several years for lung nodules, and has had regular scans for the past three years. She also reports having gallstones, which are asymptomatic, and old spine fractures for which she is on calcium and vitamin D  supplementation.      CPAP download was reviewed which shows excellent control of events on auto settings 5 to 15 cm with average pressure of 10.6 and maximum pressure of 12.7 cm.  She is very compliant close to 6 hours per night.  CPAP certainly helped improve her daytime somnolence and fatigue   Significant tests/ events reviewed  CT chest 10/21/21 >> calcified granuloma LLL, 8 mm RLL GGO stable CT chest 11/08/22 >> granuloma LLL, scattered areas of scar and fibrotic changes, BTX RUL and LUL, no change 8 mm GGO CT chest 10/2023 mild bronchiectasis, no nodules HST 06/04/16 >> AHI 46.8, SaO2 low 72%   Review of Systems neg for any significant sore throat, dysphagia, itching, sneezing, nasal congestion or excess/ purulent secretions, fever, chills, sweats, unintended wt loss, pleuritic or  exertional cp, hempoptysis, orthopnea pnd or change in chronic leg swelling. Also denies presyncope, palpitations, heartburn, abdominal pain, nausea, vomiting, diarrhea or change in bowel or urinary habits, dysuria,hematuria, rash, arthralgias, visual complaints, headache, numbness weakness or ataxia.     Objective:   Physical Exam  Gen. Pleasant, obese, in no distress ENT - no lesions, no post nasal drip Neck: No JVD, no thyromegaly, no carotid bruits Lungs: no use of accessory muscles, no dullness to percussion, decreased without rales or rhonchi  Cardiovascular: Rhythm regular, heart sounds  normal, no murmurs or gallops, no peripheral edema Musculoskeletal: No deformities, no cyanosis or clubbing , no tremors        Assessment & Plan:     Assessment and Plan : No pulmonary nodules noted on this CT scan which is reassuring    OSA on CPAP Patient demonstrates good compliance and effectiveness with the ResMed auto CPAP machine set between 5-15 cm H2O, with pressure needs around 11.6-13 cm H2O. Reports satisfaction and low apnea events (0.2). Discussed adjusting pressure range to 10-14 cm H2O for comfort, but patient prefers current settings. - Renew CPAP supplies prescription - Maintain current CPAP settings  Bronchiectasis January CT scan shows mild bronchiectasis with no suspicious nodules and mild scarring, likely from past infections or smoking. Condition is mild, not expected to cause recurrent infections or significant symptoms. Explained scarring is old and should not cause issues unless infection occurs. Discussed potential for recurrent bronchitis or pneumonia, but unlikely given  mild condition. - No further CT scans needed - Advise patient to call if experiencing symptoms of bronchitis or pneumonia (yellow, green, or black phlegm)  Gallstones Incidental finding of asymptomatic gallstones on CT scan. No intervention required unless symptomatic (meal-related pain,  especially after fatty foods). - Monitor for symptoms of gallbladder pain  General Health Maintenance Patient is taking calcium and vitamin D  for osteoporosis. - Continue calcium and vitamin D  supplementation  Follow-up - Schedule follow-up appointment in one year - Advise patient to call if experiencing symptoms of bronchitis or pneumonia.

## 2024-01-06 ENCOUNTER — Ambulatory Visit: Admitting: Internal Medicine

## 2024-01-11 ENCOUNTER — Other Ambulatory Visit: Payer: Self-pay | Admitting: Internal Medicine

## 2024-01-13 ENCOUNTER — Ambulatory Visit
Admission: RE | Admit: 2024-01-13 | Discharge: 2024-01-13 | Disposition: A | Discharge status: Home / Self Care | Payer: Medicare HMO | Source: Ambulatory Visit | Attending: Obstetrics and Gynecology | Admitting: Obstetrics and Gynecology

## 2024-01-13 DIAGNOSIS — M8589 Other specified disorders of bone density and structure, multiple sites: Secondary | ICD-10-CM

## 2024-03-05 ENCOUNTER — Encounter (HOSPITAL_COMMUNITY): Payer: Self-pay

## 2024-03-11 NOTE — Progress Notes (Unsigned)
 Subjective:    Patient ID: Angel Williamson, female    DOB: 1955/12/20, 68 y.o.   MRN: [id number]      HPI Angel Williamson is here for a Physical exam and her chronic medical problems.    Doing well - no concerns.  Medications and allergies reviewed with patient and updated if appropriate.  Current Outpatient Medications on File Prior to Visit  Medication Sig Dispense Refill   Calcium Carbonate (CALCIUM 500 PO) Take 1,000 mg by mouth daily.     Cholecalciferol (D3 ADULT PO) Take 2,000 Units by mouth daily.     EPINEPHrine  0.3 mg/0.3 mL IJ SOAJ injection Use as directed for life-threatening allergic reaction. 2 each 3   estradiol (ESTRACE) 0.1 MG/GM vaginal cream Insert one gram in vagina daily for 14 days and then 2x weekly     fluticasone  (FLONASE ) 50 MCG/ACT nasal spray Place 1 spray into both nostrils at bedtime. 16 g 2   LORazepam  (ATIVAN ) 0.5 MG tablet Take 1 tablet (0.5 mg total) by mouth every 8 (eight) hours as needed for anxiety. 30 tablet 2   losartan  (COZAAR ) 50 MG tablet TAKE 1 TABLET BY MOUTH EVERY DAY 90 tablet 1   Current Facility-Administered Medications on File Prior to Visit  Medication Dose Route Frequency Provider Last Rate Last Admin   gemcitabine  (GEMZAR ) chemo syringe for bladder instillation 2,000 mg  2,000 mg Bladder Instillation Once Andrez Banker, MD        Review of Systems  Constitutional:  Negative for fever.  Eyes:  Negative for visual disturbance.  Respiratory:  Negative for cough, shortness of breath and wheezing.   Cardiovascular:  Negative for chest pain, palpitations and leg swelling.  Gastrointestinal:  Negative for abdominal pain, blood in stool, constipation and diarrhea.       Occ gerd  Genitourinary:  Negative for dysuria.  Musculoskeletal:  Negative for arthralgias and back pain.       Morning stiffness  Skin:  Negative for rash.  Neurological:  Negative for dizziness, light-headedness and headaches.  Psychiatric/Behavioral:   Negative for dysphoric mood and sleep disturbance. The patient is nervous/anxious (situational).        Objective:   Vitals:   03/12/24 0947  BP: 132/82  Pulse: (!) 56  Temp: 98.4 F (36.9 C)  SpO2: 99%   Filed Weights   03/12/24 0947  Weight: 198 lb (89.8 kg)   Body mass index is 35.07 kg/m.  BP Readings from Last 3 Encounters:  03/12/24 132/82  12/03/23 128/82  09/12/23 125/60    Wt Readings from Last 3 Encounters:  03/12/24 198 lb (89.8 kg)  03/12/24 198 lb (89.8 kg)  12/03/23 196 lb 11.2 oz (89.2 kg)       Physical Exam Constitutional: She appears well-developed and well-nourished. No distress.  HENT:  Head: Normocephalic and atraumatic.  Right Ear: External ear normal. Normal ear canal and TM Left Ear: External ear normal.  Normal ear canal and TM Mouth/Throat: Oropharynx is clear and moist.  Eyes: Conjunctivae normal.  Neck: Neck supple. No tracheal deviation present. No thyromegaly present.  No carotid bruit  Cardiovascular: Normal rate, regular rhythm and normal heart sounds.   No murmur heard.  No edema. Pulmonary/Chest: Effort normal and breath sounds normal. No respiratory distress. She has no wheezes. She has no rales.  Breast: deferred   Abdominal: Soft. She exhibits no distension. There is no tenderness.  Lymphadenopathy: She has no cervical adenopathy.  Skin: Skin is warm  and dry. She is not diaphoretic.  Psychiatric: She has a normal mood and affect. Her behavior is normal.     Lab Results  Component Value Date   WBC 4.5 02/12/2023   HGB 12.4 02/12/2023   HCT 36.3 02/12/2023   PLT 263.0 02/12/2023   GLUCOSE 112 (H) 07/05/2023   CHOL 149 02/12/2023   TRIG 113.0 02/12/2023   HDL 51.40 02/12/2023   LDLCALC 75 02/12/2023   ALT 17 02/12/2023   AST 15 02/12/2023   NA 142 07/05/2023   K 4.3 07/05/2023   CL 104 07/05/2023   CREATININE 0.96 07/05/2023   BUN 17 07/05/2023   CO2 29 07/05/2023   TSH 0.94 02/12/2023   HGBA1C 6.2  02/12/2023         Assessment & Plan:   Physical exam: Screening blood work  ordered Exercise  active, walks up and down driveway - stressed regular exercise Weight  obese - encouraged weight loss Substance abuse  none   Reviewed recommended immunizations.   Health Maintenance  Topic Date Due   COVID-19 Vaccine (4 - 2024-25 season) 03/27/2024 (Originally 06/30/2023)   Zoster Vaccines- Shingrix (1 of 2) 06/12/2024 (Originally 01/26/1975)   DTaP/Tdap/Td (1 - Tdap) 03/12/2025 (Originally 01/26/1975)   Pneumonia Vaccine 14+ Years old (1 of 1 - PCV) 03/12/2025 (Originally 01/25/2006)   INFLUENZA VACCINE  05/29/2024   Medicare Annual Wellness (AWV)  03/12/2025   MAMMOGRAM  10/27/2025   DEXA SCAN  01/13/2027   Colonoscopy  09/11/2033   Hepatitis C Screening  Completed   HPV VACCINES  Aged Out   Meningococcal B Vaccine  Aged Out          See Problem List for Assessment and Plan of chronic medical problems.

## 2024-03-11 NOTE — Patient Instructions (Addendum)

## 2024-03-12 ENCOUNTER — Ambulatory Visit: Payer: Self-pay | Admitting: Internal Medicine

## 2024-03-12 ENCOUNTER — Ambulatory Visit: Admitting: Internal Medicine

## 2024-03-12 ENCOUNTER — Ambulatory Visit: Payer: Medicare HMO

## 2024-03-12 VITALS — Ht 63.0 in | Wt 198.0 lb

## 2024-03-12 VITALS — BP 132/82 | HR 56 | Temp 98.4°F | Ht 63.0 in | Wt 198.0 lb

## 2024-03-12 DIAGNOSIS — Z0000 Encounter for general adult medical examination without abnormal findings: Secondary | ICD-10-CM | POA: Diagnosis not present

## 2024-03-12 DIAGNOSIS — F419 Anxiety disorder, unspecified: Secondary | ICD-10-CM

## 2024-03-12 DIAGNOSIS — E559 Vitamin D deficiency, unspecified: Secondary | ICD-10-CM | POA: Diagnosis not present

## 2024-03-12 DIAGNOSIS — I10 Essential (primary) hypertension: Secondary | ICD-10-CM | POA: Diagnosis not present

## 2024-03-12 DIAGNOSIS — C679 Malignant neoplasm of bladder, unspecified: Secondary | ICD-10-CM | POA: Diagnosis not present

## 2024-03-12 DIAGNOSIS — R7303 Prediabetes: Secondary | ICD-10-CM | POA: Diagnosis not present

## 2024-03-12 DIAGNOSIS — M85859 Other specified disorders of bone density and structure, unspecified thigh: Secondary | ICD-10-CM

## 2024-03-12 LAB — LIPID PANEL
Cholesterol: 159 mg/dL (ref 0–200)
HDL: 52.1 mg/dL (ref >39.00)
LDL Cholesterol: 88 mg/dL (ref 0–99)
NonHDL: 106.68
Total CHOL/HDL Ratio: 3
Triglycerides: 94 mg/dL (ref 0.0–149.0)
VLDL: 18.8 mg/dL (ref 0.0–40.0)

## 2024-03-12 LAB — CBC WITH DIFFERENTIAL/PLATELET
Basophils Absolute: 0 10*3/uL (ref 0.0–0.1)
Basophils Relative: 0.8 % (ref 0.0–3.0)
Eosinophils Absolute: 0.2 10*3/uL (ref 0.0–0.7)
Eosinophils Relative: 3.9 % (ref 0.0–5.0)
HCT: 35.8 % — ABNORMAL LOW (ref 36.0–46.0)
Hemoglobin: 12.3 g/dL (ref 12.0–15.0)
Lymphocytes Relative: 29.8 % (ref 12.0–46.0)
Lymphs Abs: 1.7 10*3/uL (ref 0.7–4.0)
MCHC: 34.3 g/dL (ref 30.0–36.0)
MCV: 89.2 fl (ref 78.0–100.0)
Monocytes Absolute: 0.5 10*3/uL (ref 0.1–1.0)
Monocytes Relative: 8.1 % (ref 3.0–12.0)
Neutro Abs: 3.2 10*3/uL (ref 1.4–7.7)
Neutrophils Relative %: 57.4 % (ref 43.0–77.0)
Platelets: 259 10*3/uL (ref 150.0–400.0)
RBC: 4.01 Mil/uL (ref 3.87–5.11)
RDW: 12.9 % (ref 11.5–15.5)
WBC: 5.6 10*3/uL (ref 4.0–10.5)

## 2024-03-12 LAB — COMPREHENSIVE METABOLIC PANEL WITH GFR
ALT: 16 U/L (ref 0–35)
AST: 18 U/L (ref 0–37)
Albumin: 4.4 g/dL (ref 3.5–5.2)
Alkaline Phosphatase: 83 U/L (ref 39–117)
BUN: 16 mg/dL (ref 6–23)
CO2: 25 meq/L (ref 19–32)
Calcium: 9.4 mg/dL (ref 8.4–10.5)
Chloride: 104 meq/L (ref 96–112)
Creatinine, Ser: 0.83 mg/dL (ref 0.40–1.20)
GFR: 72.59 mL/min (ref >60.00)
Glucose, Bld: 90 mg/dL (ref 70–99)
Potassium: 3.7 meq/L (ref 3.5–5.1)
Sodium: 139 meq/L (ref 135–145)
Total Bilirubin: 0.6 mg/dL (ref 0.2–1.2)
Total Protein: 7.5 g/dL (ref 6.0–8.3)

## 2024-03-12 LAB — HEMOGLOBIN A1C: Hgb A1c MFr Bld: 6.2 % (ref 4.6–6.5)

## 2024-03-12 LAB — TSH: TSH: 1.3 u[IU]/mL (ref 0.35–5.50)

## 2024-03-12 LAB — VITAMIN D 25 HYDROXY (VIT D DEFICIENCY, FRACTURES): VITD: 26.52 ng/mL — ABNORMAL LOW (ref 30.00–100.00)

## 2024-03-12 MED ORDER — METOPROLOL SUCCINATE ER 25 MG PO TB24: 12.5000 mg | ORAL_TABLET | Freq: Every day | ORAL | 2 refills | Status: DC

## 2024-03-12 NOTE — Assessment & Plan Note (Signed)
 Chronic Taking vitamin D daily Check vitamin D level

## 2024-03-12 NOTE — Assessment & Plan Note (Addendum)
 Chronic Situational Check tsh Improved since mom is doing better and BP is controlled Does not feel she needs any additional medication Continue Ativan  0.25-0.5 mg every 8 hours as needed

## 2024-03-12 NOTE — Assessment & Plan Note (Addendum)
 Chronic Has had TURBT x 3 with gemcitabine  instillation for recurrent bladder cancer Last cystoscopy January 2025 and biopsy was done at that time Following closely with urology - f/u next month with cystoscopy

## 2024-03-12 NOTE — Assessment & Plan Note (Addendum)
 Chronic Blood pressure well-controlled here today and has been controlled at home Cbc, cmp Continue losartan  50 mg daily and metoprolol  xl 25 mg daily

## 2024-03-12 NOTE — Patient Instructions (Signed)
 [name] , Thank you for taking time out of your busy schedule to complete your Annual Wellness Visit with me. I enjoyed our conversation and look forward to speaking with you again next year. I, as well as your care team,  appreciate your ongoing commitment to your health goals. Please review the following plan we discussed and let me know if I can assist you in the future. Your Game plan/ To Do List     Follow up Visits: Next Medicare AWV with our clinical staff: 03/18/2025.   Have you seen your provider in the last 6 months (3 months if uncontrolled diabetes)? Yes Next Office Visit with your provider: Patient had a scheduled appointment today.    Clinician Recommendations:  Aim for 30 minutes of exercise or brisk walking, 6-8 glasses of water , and 5 servings of fruits and vegetables each day. Remember to call Dr. Daneil Dunker to schedule your yearly eye exam.  Keep up the god work.      This is a list of the screening recommended for you and due dates:  Health Maintenance  Topic Date Due   Medicare Annual Wellness Visit  03/11/2024   COVID-19 Vaccine (4 - 2024-25 season) 03/27/2024*   Zoster (Shingles) Vaccine (1 of 2) 06/12/2024*   DTaP/Tdap/Td vaccine (1 - Tdap) 03/12/2025*   Pneumonia Vaccine (1 of 1 - PCV) 03/12/2025*   Flu Shot  05/29/2024   Mammogram  10/27/2025   DEXA scan (bone density measurement)  01/13/2027   Colon Cancer Screening  09/11/2033   Hepatitis C Screening  Completed   HPV Vaccine  Aged Out   Meningitis B Vaccine  Aged Out  *Topic was postponed. The date shown is not the original due date.    Advanced directives: (Copy Requested) Please bring a copy of your health care power of attorney and living will to the office to be added to your chart at your convenience. You can mail to [facility] [address] [place], [place] [address] or email to [email or web address] Advance Care Planning is important because it:  [x]  Makes sure you receive the  medical care that is consistent with your values, goals, and preferences  [x]  It provides guidance to your family and loved ones and reduces their decisional burden about whether or not they are making the right decisions based on your wishes.  Follow the link provided in your after visit summary or read over the paperwork we have mailed to you to help you started getting your Advance Directives in place. If you need assistance in completing these, please reach out to us  so that we can help you!  See attachments for Preventive Care and Fall Prevention Tips.

## 2024-03-12 NOTE — Assessment & Plan Note (Signed)
Chronic DEXA up-to-date Continue regular walking Continue calcium and vitamin D supplementation Will check vitamin D level

## 2024-03-12 NOTE — Progress Notes (Signed)
 Subjective:   Angel Williamson is a 68 y.o. who presents for a Medicare Wellness preventive visit.  As a reminder, Annual Wellness Visits don't include a physical exam, and some assessments may be limited, especially if this visit is performed virtually. We may recommend an in-person visit if needed.  Visit Complete: Virtual I connected with  Angel Williamson on 03/12/24 by a audio enabled telemedicine application and verified that I am speaking with the correct person using two identifiers.  Patient Location: Home  Provider Location: Office/Clinic  I discussed the limitations of evaluation and management by telemedicine. The patient expressed understanding and agreed to proceed.  Vital Signs: Because this visit was a virtual/telehealth visit, some criteria may be missing or patient reported. Any vitals not documented were not able to be obtained and vitals that have been documented are patient reported.  VideoDeclined- This patient declined Librarian, academic. Therefore the visit was completed with audio only.  Persons Participating in Visit: Patient.  AWV Questionnaire: Yes: Patient Medicare AWV questionnaire was completed by the patient on 03/11/2024; I have confirmed that all information answered by patient is correct and no changes since this date.  Cardiac Risk Factors include: advanced age (>9men, >50 women);hypertension;Other (see comment), Risk factor comments: Aortic atherosclerosis, OSA     Objective:     Today's Vitals   03/12/24 0959  Weight: 198 lb (89.8 kg)  Height: 5\' 3"  (1.6 m)   Body mass index is 35.07 kg/m.     03/12/2024   10:34 AM 03/12/2023   10:43 AM 02/06/2023   10:25 AM 09/07/2022    5:44 AM 02/14/2022   11:03 AM 08/21/2021   10:35 AM 07/06/2021    6:04 AM  Advanced Directives  Does Patient Have a Medical Advance Directive? Yes Yes Yes Yes Yes Yes Yes  Type of Estate agent of [place];Living will  Healthcare Power of [place];Living will Living will Healthcare Power of [place];Living will Living will;Healthcare Power of [employer] Power of Attorney  Does patient want to make changes to medical advance directive?     No - Patient declined    Copy of Healthcare Power of Attorney in Chart? No - copy requested No - copy requested  No - copy requested No - copy requested  No - copy requested    Current Medications (verified) Outpatient Encounter Medications as of 03/12/2024  Medication Sig   Calcium Carbonate (CALCIUM 500 PO) Take 1,000 mg by mouth daily.   Cholecalciferol (D3 ADULT PO) Take 2,000 Units by mouth daily.   EPINEPHrine  0.3 mg/0.3 mL IJ SOAJ injection Use as directed for life-threatening allergic reaction.   estradiol (ESTRACE) 0.1 MG/GM vaginal cream Insert one gram in vagina daily for 14 days and then 2x weekly   fluticasone  (FLONASE ) 50 MCG/ACT nasal spray Place 1 spray into both nostrils at bedtime.   LORazepam  (ATIVAN ) 0.5 MG tablet Take 1 tablet (0.5 mg total) by mouth every 8 (eight) hours as needed for anxiety.   losartan  (COZAAR ) 50 MG tablet TAKE 1 TABLET BY MOUTH EVERY DAY   metoprolol  succinate (TOPROL -XL) 25 MG 24 hr tablet Take 0.5 tablets (12.5 mg total) by mouth daily.   Facility-Administered Encounter Medications as of 03/12/2024  Medication   gemcitabine  (GEMZAR ) chemo syringe for bladder instillation 2,000 mg    Allergies (verified) Hydrochlorothiazide, Amlodipine , Losartan  potassium, Phenazopyridine , Septra  [sulfamethoxazole -trimethoprim ], Shellfish allergy, Spironolactone , and Benazepril hcl   History: Past Medical History:  Diagnosis Date  Anxiety    Bladder cancer [facility])    urologist-- dr Dulcy Gibney   Breast fibrocystic disorder    History of adenomatous polyp of colon    History of COVID-19 03/2021   per pt positive home test ,  mild symptoms that resolved   Hypertension    followed by pcp    (ETT in epic 07-09-2014 normal)   Lung  nodule    followed by dr Matilde Son   OSA on CPAP    followed by dr Matilde Son--- study in epic 08-07-20107 severe osa   Osteopenia    Renal cyst, left    Sleep apnea    Varicose veins of both lower extremities    Past Surgical History:  Procedure Laterality Date   COLONOSCOPY  2013   TRANSURETHRAL RESECTION OF BLADDER TUMOR WITH MITOMYCIN -C Bilateral 07/06/2021   Procedure: TRANSURETHRAL RESECTION OF BLADDER TUMOR BILATERAL RETROGRADE PYELOGRAM WITH GEMCITABINE  INSTILLATION IN PACU;  Surgeon: Andrez Banker, MD;  Location: [facility];  Service: Urology;  Laterality: Bilateral;   TRANSURETHRAL RESECTION OF BLADDER TUMOR WITH MITOMYCIN -C Bilateral 09/07/2022   Procedure: TRANSURETHRAL RESECTION OF BLADDER TUMOR BIOPSY, BILATERAL RETROGRADE PYELOGRAMS;  Surgeon: Andrez Banker, MD;  [facility];  Service: Urology;  Laterality: Bilateral;   Family History  Problem Relation Age of Onset   Anemia Mother    Colon polyps Father    Diabetes Maternal Grandfather    Cancer Paternal Grandmother        stomach   Stomach cancer Paternal Grandmother    Stroke Paternal Grandfather 16   Asthma Son    Anemia Other        Cousin    Colon cancer Neg Hx    Esophageal cancer Neg Hx    Rectal cancer Neg Hx    Breast cancer Neg Hx    Social History   Socioeconomic History   Marital status: Married    Spouse name: Angel Williamson   Number of children: 2   Years of education: Not on file   Highest education level: Associate degree: academic program  Occupational History   Occupation: Optician, dispensing    Comment: [employer]  Tobacco Use   Smoking status: Former    Current packs/day: 0.00    Average packs/day: 1 pack/day for 15.0 years (15.0 ttl pk-yrs)    Types: Cigarettes    Start date: 45    Quit date: 1988    Years since quitting: 37.3   Smokeless tobacco: Never  Vaping Use   Vaping status: Never Used  Substance and Sexual Activity   Alcohol  use: Never   Drug use: Never   Sexual activity: Not on file  Other Topics Concern   Not on file  Social History Narrative   Lives with husband/2025   Caffeine none since 2015   Social Drivers of Health   Financial Resource Strain: Low Risk  (03/11/2024)   Overall Financial Resource Strain (CARDIA)    Difficulty of Paying Living Expenses: Not hard at all  Food Insecurity: No Food Insecurity (03/11/2024)   Hunger Vital Sign    Worried About Running Out of Food in the Last Year: Never true    Ran Out of Food in the Last Year: Never true  Transportation Needs: No Transportation Needs (03/11/2024)   PRAPARE - Administrator, Civil Service (Medical): No    Lack of Transportation (Non-Medical): No  Physical Activity: Insufficiently Active (03/11/2024)   Exercise Vital Sign  Days of Exercise per Week: 4 days    Minutes of Exercise per Session: 30 min  Stress: No Stress Concern Present (03/11/2024)   [employer] of Occupational Health - Occupational Stress Questionnaire    Feeling of Stress : Only a little  Social Connections: Socially Integrated (03/11/2024)   Social Connection and Isolation Panel [NHANES]    Frequency of Communication with Friends and Family: More than three times a week    Frequency of Social Gatherings with Friends and Family: More than three times a week    Attends Religious Services: More than 4 times per year    Active Member of [employer] or Organizations: Yes    Attends Engineer, structural: More than 4 times per year    Marital Status: Married    Tobacco Counseling Counseling given: Not Answered    Clinical Intake:  Pre-visit preparation completed: Yes  Pain : No/denies pain     BMI - recorded: 35.07 Nutritional Status: BMI > 30  Obese Nutritional Risks: None Diabetes: No  Lab Results  Component Value Date   HGBA1C 6.2 02/12/2023   HGBA1C 6.0 02/05/2022   HGBA1C 6.1 01/11/2021     How often do you need to have someone  help you when you read instructions, pamphlets, or other written materials from your doctor or pharmacy?: 1 - Never  Interpreter Needed?: No  Information entered by :: Ellayna Hilligoss, RMA   Activities of Daily Living     03/11/2024   12:48 PM  In your present state of health, do you have any difficulty performing the following activities:  Hearing? 0  Vision? 0  Difficulty concentrating or making decisions? 0  Walking or climbing stairs? 0  Dressing or bathing? 0  Doing errands, shopping? 0  Preparing Food and eating ? N  Using the Toilet? N  In the past six months, have you accidently leaked urine? N  Do you have problems with loss of bowel control? N  Managing your Medications? N  Managing your Finances? N  Housekeeping or managing your Housekeeping? N    Patient Care Team: Colene Dauphin, MD as PCP - General (Internal Medicine) Daneil Dunker Lytle Saunders, DO as Consulting Physician (Ophthalmology)  Indicate any recent Medical Services you may have received from other than [facility] providers in the past year (date may be approximate).     Assessment:    This is a routine wellness examination for Sarde.  Hearing/Vision screen Hearing Screening - Comments:: Denies hearing difficulties   Vision Screening - Comments:: Wears eyeglasses/ Dr. Barton Like eye center   Goals Addressed   None    Depression Screen     03/12/2024   10:35 AM 07/05/2023    3:39 PM 03/12/2023   10:44 AM 02/12/2023   10:04 AM 10/01/2022   11:16 AM 02/14/2022   11:07 AM 02/05/2022   10:25 AM  PHQ 2/9 Scores  PHQ - 2 Score 0 0 0 0 0 0 1  PHQ- 9 Score 0  0 0 0      Fall Risk     03/11/2024   12:48 PM 07/05/2023    3:39 PM 03/12/2023   10:43 AM 03/11/2023   11:47 AM 02/12/2023   10:04 AM  Fall Risk   Falls in the past year? 1 0 0 0 0  Number falls in past yr:  0 0  0  Injury with Fall? 0 0 0  0  Risk for fall due to : Medication side effect  No Fall Risks  No Fall Risks  Follow up Falls evaluation  completed;Falls prevention discussed Falls evaluation completed Falls prevention discussed  Falls evaluation completed    MEDICARE RISK AT HOME:  Medicare Risk at Home Any stairs in or around the home?: (Patient-Rptd) Yes If so, are there any without handrails?: (Patient-Rptd) No Home free of loose throw rugs in walkways, pet beds, electrical cords, etc?: (Patient-Rptd) Yes Adequate lighting in your home to reduce risk of falls?: (Patient-Rptd) Yes Life alert?: (Patient-Rptd) No Use of a cane, walker or w/c?: (Patient-Rptd) No Grab bars in the bathroom?: (Patient-Rptd) No Shower chair or bench in shower?: (Patient-Rptd) Yes Elevated toilet seat or a handicapped toilet?: (Patient-Rptd) No  TIMED UP AND GO:  Was the test performed?  No  Cognitive Function: Declined/Normal: No cognitive concerns noted by patient or family. Patient alert, oriented, able to answer questions appropriately and recall recent events. No signs of memory loss or confusion.        03/12/2023   10:47 AM 02/14/2022   11:17 AM  6CIT Screen  What Year? 0 points 0 points  What month? 0 points 0 points  What time? 0 points 0 points  Count back from 20 0 points 0 points  Months in reverse 0 points 0 points  Repeat phrase 0 points 0 points  Total Score 0 points 0 points    Immunizations Immunization History  Administered Date(s) Administered   PFIZER(Purple Top)SARS-COV-2 Vaccination 01/18/2020, 02/08/2020, 09/06/2020    Screening Tests Health Maintenance  Topic Date Due   Medicare Annual Wellness (AWV)  03/11/2024   COVID-19 Vaccine (4 - 2024-25 season) 03/27/2024 (Originally 06/30/2023)   Zoster Vaccines- Shingrix (1 of 2) 06/12/2024 (Originally 01/26/1975)   DTaP/Tdap/Td (1 - Tdap) 03/12/2025 (Originally 01/26/1975)   Pneumonia Vaccine 39+ Years old (1 of 1 - PCV) 03/12/2025 (Originally 01/25/2006)   INFLUENZA VACCINE  05/29/2024   MAMMOGRAM  10/27/2025   DEXA SCAN  01/13/2027   Colonoscopy  09/11/2033    Hepatitis C Screening  Completed   HPV VACCINES  Aged Out   Meningococcal B Vaccine  Aged Out    Health Maintenance  Health Maintenance Due  Topic Date Due   Medicare Annual Wellness (AWV)  03/11/2024   Health Maintenance Items Addressed: See Nurse Notes  Additional Screening:  Vision Screening: Recommended annual ophthalmology exams for early detection of glaucoma and other disorders of the eye.  Dental Screening: Recommended annual dental exams for proper oral hygiene  Community Resource Referral / Chronic Care Management: CRR required this visit?  No   CCM required this visit?  No   Plan:    I have personally reviewed and noted the following in the patient's chart:   Medical and social history Use of alcohol, tobacco or illicit drugs  Current medications and supplements including opioid prescriptions. Patient is not currently taking opioid prescriptions. Functional ability and status Nutritional status Physical activity Advanced directives List of other physicians Hospitalizations, surgeries, and ER visits in previous 12 months Vitals Screenings to include cognitive, depression, and falls Referrals and appointments  In addition, I have reviewed and discussed with patient certain preventive protocols, quality metrics, and best practice recommendations. A written personalized care plan for preventive services as well as general preventive health recommendations were provided to patient.   Clorene Nerio L Bladen Umar, CMA   03/12/2024   After Visit Summary: (MyChart) Due to this being a telephonic visit, the after visit summary with patients personalized plan was offered to patient via  MyChart   Notes: Please refer to Routing Comments.

## 2024-03-12 NOTE — Assessment & Plan Note (Signed)
 Chronic Lab Results  Component Value Date   HGBA1C 6.2 02/12/2023   Check a1c Low sugar / carb diet Stressed regular exercise

## 2024-05-25 ENCOUNTER — Telehealth: Payer: Self-pay | Admitting: Pulmonary Disease

## 2024-06-17 ENCOUNTER — Other Ambulatory Visit: Payer: Self-pay | Admitting: Urology

## 2024-06-18 ENCOUNTER — Telehealth (HOSPITAL_BASED_OUTPATIENT_CLINIC_OR_DEPARTMENT_OTHER): Payer: Self-pay

## 2024-06-18 DIAGNOSIS — G4733 Obstructive sleep apnea (adult) (pediatric): Secondary | ICD-10-CM

## 2024-06-18 NOTE — Telephone Encounter (Signed)
 Copied from CRM [id number]. Topic: Clinical - Order For Equipment >> Jun 18, 2024  3:39 PM Isabell A wrote: Reason for CRM: Patient states she's been waiting for her CPAP supplies since July - would like an update.  Callback number: [phone]

## 2024-07-05 ENCOUNTER — Other Ambulatory Visit: Payer: Self-pay | Admitting: Internal Medicine

## 2024-07-16 NOTE — Patient Instructions (Signed)
 SURGICAL WAITING ROOM VISITATION  Patients having surgery or a procedure may have no more than 2 support people in the waiting area - these visitors may rotate.    Children under the age of 65 must have an adult with them who is not the patient.  Visitors with respiratory illnesses are discouraged from visiting and should remain at home.  If the patient needs to stay at the hospital during part of their recovery, the visitor guidelines for inpatient rooms apply. Pre-op nurse will coordinate an appropriate time for 1 support person to accompany patient in pre-op.  This support person may not rotate.    Please refer to the [facility] website for the visitor guidelines for Inpatients (after your surgery is over and you are in a regular room).       Your procedure is scheduled on:  07/30/2024    Report to [facility] Main Entrance    Report to admitting at   0700AM   Call this number if you have problems the morning of surgery [phone]   Do not eat food  or drink liquids :After Midnight.                  If you have questions, please contact your surgeon's office.      Oral Hygiene is also important to reduce your risk of infection.                                    Remember - BRUSH YOUR TEETH THE MORNING OF SURGERY WITH YOUR REGULAR TOOTHPASTE  DENTURES WILL BE REMOVED PRIOR TO SURGERY PLEASE DO NOT APPLY Poly grip OR ADHESIVES!!!   Do NOT smoke after Midnight   Stop all vitamins and herbal supplements 7 days before surgery.   Take these medicines the morning of surgery with A SIP OF WATER :  toprol    DO NOT TAKE ANY ORAL DIABETIC MEDICATIONS DAY OF YOUR SURGERY  Bring CPAP mask and tubing day of surgery.                              You may not have any metal on your body including hair pins, jewelry, and body piercing             Do not wear make-up, lotions, powders, perfumes/cologne, or deodorant  Do not wear nail polish including gel and S&S,  artificial/acrylic nails, or any other type of covering on natural nails including finger and toenails. If you have artificial nails, gel coating, etc. that needs to be removed by a nail salon please have this removed prior to surgery or surgery may need to be canceled/ delayed if the surgeon/ anesthesia feels like they are unable to be safely monitored.   Do not shave  48 hours prior to surgery.               Men may shave face and neck.   Do not bring valuables to the hospital. [place] IS NOT             RESPONSIBLE   FOR VALUABLES.   Contacts, glasses, dentures or bridgework may not be worn into surgery.   Bring small overnight bag day of surgery.   DO NOT BRING YOUR HOME MEDICATIONS TO THE HOSPITAL. PHARMACY WILL DISPENSE MEDICATIONS LISTED ON YOUR MEDICATION LIST TO YOU DURING YOUR  ADMISSION IN THE HOSPITAL!    Patients discharged on the day of surgery will not be allowed to drive home.  Someone NEEDS to stay with you for the first 24 hours after anesthesia.   Special Instructions: Bring a copy of your healthcare power of attorney and living will documents the day of surgery if you haven't scanned them before.              Please read over the following fact sheets you were given: IF YOU HAVE QUESTIONS ABOUT YOUR PRE-OP INSTRUCTIONS PLEASE CALL [phone].   If you received a COVID test during your pre-op visit  it is requested that you wear a mask when out in public, stay away from anyone that may not be feeling well and notify your surgeon if you develop symptoms. If you test positive for Covid or have been in contact with anyone that has tested positive in the last 10 days please notify you surgeon.    [place] - Preparing for Surgery Before surgery, you can play an important role.  Because skin is not sterile, your skin needs to be as free of germs as possible.  You can reduce the number of germs on your skin by washing with CHG (chlorahexidine gluconate) soap before surgery.   CHG is an antiseptic cleaner which kills germs and bonds with the skin to continue killing germs even after washing. Please DO NOT use if you have an allergy to CHG or antibacterial soaps.  If your skin becomes reddened/irritated stop using the CHG and inform your nurse when you arrive at Short Stay. Do not shave (including legs and underarms) for at least 48 hours prior to the first CHG shower.  You may shave your face/neck. Please follow these instructions carefully:  1.  Shower with CHG Soap the night before surgery and the  morning of Surgery.  2.  If you choose to wash your hair, wash your hair first as usual with your  normal  shampoo.  3.  After you shampoo, rinse your hair and body thoroughly to remove the  shampoo.                           4.  Use CHG as you would any other liquid soap.  You can apply chg directly  to the skin and wash                       Gently with a scrungie or clean washcloth.  5.  Apply the CHG Soap to your body ONLY FROM THE NECK DOWN.   Do not use on face/ open                           Wound or open sores. Avoid contact with eyes, ears mouth and genitals (private parts).                       Wash face,  Genitals (private parts) with your normal soap.             6.  Wash thoroughly, paying special attention to the area where your surgery  will be performed.  7.  Thoroughly rinse your body with warm water  from the neck down.  8.  DO NOT shower/wash with your normal soap after using and rinsing off  the CHG Soap.  9.  Pat yourself dry with a clean towel.            10.  Wear clean pajamas.            11.  Place clean sheets on your bed the night of your first shower and do not  sleep with pets. Day of Surgery : Do not apply any lotions/deodorants the morning of surgery.  Please wear clean clothes to the hospital/surgery center.  FAILURE TO FOLLOW THESE INSTRUCTIONS MAY RESULT IN THE CANCELLATION OF YOUR SURGERY PATIENT  SIGNATURE_________________________________  NURSE SIGNATURE__________________________________  ________________________________________________________________________

## 2024-07-16 NOTE — Progress Notes (Addendum)
 Anesthesia Review:  PCP: Glade Hope LOV 03/12/24  Cardiologist : none OPulm- DR Jude LOV 12/03/23   PPM/ ICD: Device Orders: Rep Notified:  Chest x-ray : CT Chest-11/22/23  EKG :07/20/24  Echo : Stress test: Cardiac Cath :   Activity level: can do a flight of stairs wtihout difficutly  Sleep Study/ CPAP : has cpap  Fasting Blood Sugar :      / Checks Blood Sugar -- times a day:    Blood Thinner/ Instructions /Last Dose: ASA / Instructions/ Last Dose :

## 2024-07-20 ENCOUNTER — Encounter (HOSPITAL_COMMUNITY)
Admission: RE | Admit: 2024-07-20 | Discharge: 2024-07-20 | Disposition: A | Discharge status: Home / Self Care | Source: Ambulatory Visit | Attending: Urology

## 2024-07-20 ENCOUNTER — Other Ambulatory Visit: Payer: Self-pay

## 2024-07-20 ENCOUNTER — Encounter (HOSPITAL_COMMUNITY): Payer: Self-pay

## 2024-07-20 VITALS — BP 160/83 | HR 61 | Temp 99.3°F | Resp 16 | Ht 62.0 in | Wt 194.0 lb

## 2024-07-20 DIAGNOSIS — Z01818 Encounter for other preprocedural examination: Secondary | ICD-10-CM | POA: Diagnosis present

## 2024-07-20 HISTORY — DX: Anemia, unspecified: D64.9

## 2024-07-20 LAB — CBC
HCT: 38.6 % (ref 36.0–46.0)
Hemoglobin: 12.5 g/dL (ref 12.0–15.0)
MCH: 30.2 pg (ref 26.0–34.0)
MCHC: 32.4 g/dL (ref 30.0–36.0)
MCV: 93.2 fL (ref 80.0–100.0)
Platelets: 270 K/uL (ref 150–400)
RBC: 4.14 MIL/uL (ref 3.87–5.11)
RDW: 11.9 % (ref 11.5–15.5)
WBC: 7 K/uL (ref 4.0–10.5)
nRBC: 0 % (ref 0.0–0.2)

## 2024-07-20 LAB — BASIC METABOLIC PANEL WITH GFR
Anion gap: 12 (ref 5–15)
BUN: 16 mg/dL (ref 8–23)
CO2: 25 mmol/L (ref 22–32)
Calcium: 9.9 mg/dL (ref 8.9–10.3)
Chloride: 106 mmol/L (ref 98–111)
Creatinine, Ser: 0.92 mg/dL (ref 0.44–1.00)
GFR, Estimated: >60 mL/min (ref >60)
Glucose, Bld: 112 mg/dL — ABNORMAL HIGH (ref 70–99)
Potassium: 4.9 mmol/L (ref 3.5–5.1)
Sodium: 143 mmol/L (ref 135–145)

## 2024-07-22 ENCOUNTER — Other Ambulatory Visit: Payer: Self-pay | Admitting: Internal Medicine

## 2024-07-30 ENCOUNTER — Encounter (HOSPITAL_COMMUNITY): Payer: Self-pay | Admitting: Urology

## 2024-07-30 ENCOUNTER — Ambulatory Visit (HOSPITAL_BASED_OUTPATIENT_CLINIC_OR_DEPARTMENT_OTHER): Admitting: Certified Registered"

## 2024-07-30 ENCOUNTER — Encounter (HOSPITAL_COMMUNITY)
Admission: RE | Disposition: A | Discharge status: Home / Self Care | Payer: Self-pay | Source: Ambulatory Visit | Attending: Urology

## 2024-07-30 ENCOUNTER — Other Ambulatory Visit: Payer: Self-pay

## 2024-07-30 ENCOUNTER — Ambulatory Visit (HOSPITAL_COMMUNITY): Payer: Self-pay | Admitting: Medical

## 2024-07-30 ENCOUNTER — Ambulatory Visit (HOSPITAL_COMMUNITY)
Admission: RE | Admit: 2024-07-30 | Discharge: 2024-07-30 | Disposition: A | Discharge status: Home / Self Care | Source: Ambulatory Visit | Attending: Urology | Admitting: Urology

## 2024-07-30 ENCOUNTER — Ambulatory Visit (HOSPITAL_COMMUNITY)

## 2024-07-30 DIAGNOSIS — G4730 Sleep apnea, unspecified: Secondary | ICD-10-CM | POA: Diagnosis not present

## 2024-07-30 DIAGNOSIS — I10 Essential (primary) hypertension: Secondary | ICD-10-CM | POA: Insufficient documentation

## 2024-07-30 DIAGNOSIS — Z87891 Personal history of nicotine dependence: Secondary | ICD-10-CM | POA: Insufficient documentation

## 2024-07-30 DIAGNOSIS — G4733 Obstructive sleep apnea (adult) (pediatric): Secondary | ICD-10-CM

## 2024-07-30 DIAGNOSIS — Z01818 Encounter for other preprocedural examination: Secondary | ICD-10-CM

## 2024-07-30 DIAGNOSIS — C678 Malignant neoplasm of overlapping sites of bladder: Secondary | ICD-10-CM | POA: Insufficient documentation

## 2024-07-30 HISTORY — PX: BLADDER INSTILLATION: SHX6893

## 2024-07-30 HISTORY — PX: CYSTOSCOPY WITH BIOPSY: SHX5122

## 2024-07-30 SURGERY — CYSTOSCOPY, WITH BIOPSY
Anesthesia: General | Site: Bladder | Laterality: Bilateral

## 2024-07-30 MED ORDER — SUGAMMADEX SODIUM 200 MG/2ML IV SOLN
INTRAVENOUS | Status: AC
Start: 2024-07-30 — End: 2024-07-30
  Filled 2024-07-30: qty 2

## 2024-07-30 MED ORDER — OXYCODONE HCL 5 MG/5ML PO SOLN: 5.0000 mg | Freq: Once | ORAL | Status: DC | PRN

## 2024-07-30 MED ORDER — AMISULPRIDE (ANTIEMETIC) 5 MG/2ML IV SOLN: 10.0000 mg | Freq: Once | INTRAVENOUS | Status: DC | PRN

## 2024-07-30 MED ORDER — FENTANYL CITRATE (PF) 100 MCG/2ML IJ SOLN
INTRAMUSCULAR | Status: DC | PRN
  Administered 2024-07-30 (×2): 50 ug via INTRAVENOUS

## 2024-07-30 MED ORDER — GEMCITABINE CHEMO FOR BLADDER INSTILLATION 2000 MG: 2000.0000 mg | Freq: Once | INTRAVENOUS | Status: DC

## 2024-07-30 MED ORDER — DEXAMETHASONE SODIUM PHOSPHATE 10 MG/ML IJ SOLN
INTRAMUSCULAR | Status: DC | PRN
  Administered 2024-07-30: 4 mg via INTRAVENOUS

## 2024-07-30 MED ORDER — STERILE WATER FOR IRRIGATION IR SOLN
Status: DC | PRN
  Administered 2024-07-30: 1000 mL

## 2024-07-30 MED ORDER — LACTATED RINGERS IV SOLN: INTRAVENOUS | Status: DC

## 2024-07-30 MED ORDER — FENTANYL CITRATE (PF) 100 MCG/2ML IJ SOLN
INTRAMUSCULAR | Status: AC
  Filled 2024-07-30: qty 2

## 2024-07-30 MED ORDER — FENTANYL CITRATE PF 50 MCG/ML IJ SOSY
25.0000 ug | PREFILLED_SYRINGE | INTRAMUSCULAR | Status: DC | PRN
  Administered 2024-07-30 (×2): 25 ug via INTRAVENOUS

## 2024-07-30 MED ORDER — TRAMADOL HCL 50 MG PO TABS: 50.0000 mg | ORAL_TABLET | Freq: Four times a day (QID) | ORAL | 0 refills | Status: AC | PRN

## 2024-07-30 MED ORDER — 0.9 % SODIUM CHLORIDE (POUR BTL) OPTIME
TOPICAL | Status: DC | PRN
  Administered 2024-07-30: 1000 mL

## 2024-07-30 MED ORDER — CIPROFLOXACIN IN D5W 400 MG/200ML IV SOLN
400.0000 mg | INTRAVENOUS | Status: AC
  Administered 2024-07-30: 400 mg via INTRAVENOUS
  Filled 2024-07-30: qty 200

## 2024-07-30 MED ORDER — ORAL CARE MOUTH RINSE: 15.0000 mL | Freq: Once | OROMUCOSAL | Status: AC

## 2024-07-30 MED ORDER — ROCURONIUM BROMIDE 10 MG/ML (PF) SYRINGE
PREFILLED_SYRINGE | INTRAVENOUS | Status: DC | PRN
  Administered 2024-07-30: 5 mg via INTRAVENOUS
  Administered 2024-07-30: 50 mg via INTRAVENOUS

## 2024-07-30 MED ORDER — SODIUM CHLORIDE 0.9 % IR SOLN
Status: DC | PRN
  Administered 2024-07-30: 3000 mL

## 2024-07-30 MED ORDER — EPHEDRINE SULFATE-NACL 50-0.9 MG/10ML-% IV SOSY
PREFILLED_SYRINGE | INTRAVENOUS | Status: DC | PRN
  Administered 2024-07-30 (×2): 5 mg via INTRAVENOUS

## 2024-07-30 MED ORDER — LIDOCAINE HCL (PF) 2 % IJ SOLN
INTRAMUSCULAR | Status: AC
  Filled 2024-07-30: qty 5

## 2024-07-30 MED ORDER — ACETAMINOPHEN 500 MG PO TABS
1000.0000 mg | ORAL_TABLET | Freq: Once | ORAL | Status: AC
  Administered 2024-07-30: 1000 mg via ORAL
  Filled 2024-07-30: qty 2

## 2024-07-30 MED ORDER — ONDANSETRON HCL 4 MG/2ML IJ SOLN
INTRAMUSCULAR | Status: AC
  Filled 2024-07-30: qty 2

## 2024-07-30 MED ORDER — CHLORHEXIDINE GLUCONATE 0.12 % MT SOLN
15.0000 mL | Freq: Once | OROMUCOSAL | Status: AC
  Administered 2024-07-30: 15 mL via OROMUCOSAL

## 2024-07-30 MED ORDER — PROPOFOL 10 MG/ML IV BOLUS
INTRAVENOUS | Status: AC
  Filled 2024-07-30: qty 20

## 2024-07-30 MED ORDER — MIDAZOLAM HCL 2 MG/2ML IJ SOLN
INTRAMUSCULAR | Status: AC
  Filled 2024-07-30: qty 2

## 2024-07-30 MED ORDER — SUGAMMADEX SODIUM 200 MG/2ML IV SOLN
INTRAVENOUS | Status: DC | PRN
  Administered 2024-07-30: 200 mg via INTRAVENOUS

## 2024-07-30 MED ORDER — ONDANSETRON HCL 4 MG/2ML IJ SOLN
INTRAMUSCULAR | Status: DC | PRN
  Administered 2024-07-30: 4 mg via INTRAVENOUS

## 2024-07-30 MED ORDER — DEXAMETHASONE SODIUM PHOSPHATE 10 MG/ML IJ SOLN
INTRAMUSCULAR | Status: AC
  Filled 2024-07-30: qty 1

## 2024-07-30 MED ORDER — MIDAZOLAM HCL 2 MG/2ML IJ SOLN
INTRAMUSCULAR | Status: DC | PRN
  Administered 2024-07-30: 2 mg via INTRAVENOUS

## 2024-07-30 MED ORDER — PHENYLEPHRINE 80 MCG/ML (10ML) SYRINGE FOR IV PUSH (FOR BLOOD PRESSURE SUPPORT)
PREFILLED_SYRINGE | INTRAVENOUS | Status: DC | PRN
  Administered 2024-07-30: 160 ug via INTRAVENOUS

## 2024-07-30 MED ORDER — IOHEXOL 300 MG/ML  SOLN
INTRAMUSCULAR | Status: DC | PRN
  Administered 2024-07-30: 17 mL

## 2024-07-30 MED ORDER — LIDOCAINE HCL (CARDIAC) PF 100 MG/5ML IV SOSY
PREFILLED_SYRINGE | INTRAVENOUS | Status: DC | PRN
  Administered 2024-07-30: 80 mg via INTRAVENOUS

## 2024-07-30 MED ORDER — GEMCITABINE CHEMO FOR BLADDER INSTILLATION 2000 MG
2000.0000 mg | Freq: Once | INTRAVENOUS | Status: AC
  Administered 2024-07-30: 2000 mg via INTRAVESICAL
  Filled 2024-07-30: qty 2000

## 2024-07-30 MED ORDER — PROPOFOL 10 MG/ML IV BOLUS
INTRAVENOUS | Status: DC | PRN
  Administered 2024-07-30: 130 mg via INTRAVENOUS

## 2024-07-30 MED ORDER — ROCURONIUM BROMIDE 10 MG/ML (PF) SYRINGE
PREFILLED_SYRINGE | INTRAVENOUS | Status: AC
Start: 2024-07-30 — End: 2024-07-30
  Filled 2024-07-30: qty 10

## 2024-07-30 MED ORDER — FENTANYL CITRATE PF 50 MCG/ML IJ SOSY
PREFILLED_SYRINGE | INTRAMUSCULAR | Status: AC
  Filled 2024-07-30: qty 1

## 2024-07-30 MED ORDER — OXYCODONE HCL 5 MG PO TABS: 5.0000 mg | ORAL_TABLET | Freq: Once | ORAL | Status: DC | PRN

## 2024-07-30 SURGICAL SUPPLY — 14 items
BAG URO CATCHER STRL LF (MISCELLANEOUS) ×2 IMPLANT
CATH FOLEY 2WAY SLVR 5CC 18FR (CATHETERS) IMPLANT
DRAPE FOOT SWITCH (DRAPES) ×2 IMPLANT
ELECT REM PT RETURN 15FT ADLT (MISCELLANEOUS) ×2 IMPLANT
GLOVE SURG LX STRL 7.5 STRW (GLOVE) ×2 IMPLANT
GOWN STRL REUS W/ TWL XL LVL3 (GOWN DISPOSABLE) ×2 IMPLANT
KIT TURNOVER KIT A (KITS) ×2 IMPLANT
LOOP CUT BIPOLAR 24F LRG (ELECTROSURGICAL) IMPLANT
MANIFOLD NEPTUNE II (INSTRUMENTS) ×2 IMPLANT
NDL SAFETY ECLIPSE 18X1.5 (NEEDLE) ×2 IMPLANT
PACK CYSTO (CUSTOM PROCEDURE TRAY) ×2 IMPLANT
SYRINGE TOOMEY IRRIG 70ML (MISCELLANEOUS) IMPLANT
TUBING CONNECTING 10 (TUBING) ×2 IMPLANT
TUBING UROLOGY SET (TUBING) ×2 IMPLANT

## 2024-07-30 NOTE — H&P (Signed)
 f/u for transitional cell carcinoma  HPI: Angel Williamson is a 68 year-old female established patient who is here for surveillance of bladder cancer.  She underwent a TURBT. Her last bladder tumor was resected 08/31/2022. She has had the a total of 5 bladder resections. The patient had their first TURBT in approximately 12/28/2022.   Tumor Pathology: Ta LG NMIBC, 2nd resection was small PUNLMP, 3rd resection was LG TCC, 4th resection was PUNLMP, 5th resection LGNMIBC.   She had treatment with the following intravesical agents: gemcitabine .   Her last cysto was approximately 11/30/2023. His cystoscopy demonstrated small recurrence right posterior wall/right UO.   Her last radiologic test to evaluate the kidneys was approximately 06/29/2021. Imaging results: RPG - nml. The patient did not have labs prior to her office visit today.   She is not having pain in new locations. She has not had blood in her urine recently. She has not recently had unwanted weight loss.   Small bladder cancer resected November 2023, this was complicated by small perforation in the bladder.   Interval: Patient seen today 4 months after last cystoscopy. At that time we did remove 2 small lesions, one of them was low-grade nonmuscle invasive bladder cancer, the other was squamous metaplasia. The patient has done well over the past 4 months with no significant voiding symptoms.     ALLERGIES: Shellfish    MEDICATIONS: Hydrochlorothiazide  Metoprolol  Succinate ER 25 MG Tablet Extended Release 24 Hour  Phenazopyridine  Hcl  amLODIPine  Bes+SyrSpend SF  Benazepril Hcl  LORazepam  0.5 MG Tablet  Losartan  Potassium 25 MG Tablet  Spironolactone      GU PSH: Bladder Instill AntiCA Agent - 02/06/2023 Catheterize For Residual - 08/12/2023 Cysto Bladder Ureth Biopsy - 02/06/2023 Cysto Fulgurate < 0.5 cm - 11/25/2023, 08/12/2023, 2023 Cystoscopy - 05/09/2023, 12/27/2022, 08/14/2022, 2023, 10/10/2021, 2022 Cystoscopy TURBT <2 cm -  09/07/2022 Cystoscopy TURBT 2-5 cm - 2022     NON-GU PSH: Visit Complexity (formerly GPC1X) - 11/25/2023, 05/09/2023     GU PMH: Bladder Cancer overlapping sites - 11/25/2023, - 08/12/2023, - 05/09/2023, - 12/27/2022, - 08/14/2022, - 2023 Acute Cystitis/UTI - 10/23/2022 Bladder Cancer, Unspec - 09/24/2022 Bladder Cancer Lateral - 2023, - 10/10/2021, - 2022, - 2022 Dorsalgia, Unspec - 2022    NON-GU PMH: Anxiety Hypertension Sleep Apnea    FAMILY HISTORY: 1 Daughter - Daughter 1 son - Son Diabetes - Grandfather nephrolithiasis - Brother   SOCIAL HISTORY: Marital Status: Married Preferred Language: English; Ethnicity: Not Hispanic Or Latino; Race: White Current Smoking Status: Patient does not smoke anymore. Has not smoked since 05/30/1987. Smoked for 15 years. Smoked 1 pack per day.   Tobacco Use Assessment Completed: Used Tobacco in last 30 days? Has never drank.  Does not drink caffeine. Patient's occupation is/was Retired.    REVIEW OF SYSTEMS:    GU Review Female:   Patient denies frequent urination, hard to postpone urination, burning /pain with urination, get up at night to urinate, leakage of urine, stream starts and stops, trouble starting your stream, have to strain to urinate, and being pregnant.  Gastrointestinal (Upper):   Patient denies nausea, vomiting, and indigestion/ heartburn.  Gastrointestinal (Lower):   Patient denies constipation and diarrhea.  Constitutional:   Patient denies fever, night sweats, weight loss, and fatigue.  Skin:   Patient denies skin rash/ lesion and itching.  Eyes:   Patient denies blurred vision and double vision.  Ears/ Nose/ Throat:   Patient denies sore throat and sinus problems.  Hematologic/Lymphatic:   Patient denies swollen glands and easy bruising.  Cardiovascular:   Patient denies leg swelling and chest pains.  Respiratory:   Patient denies cough and shortness of breath.  Endocrine:   Patient denies excessive thirst.   Musculoskeletal:   Patient denies back pain and joint pain.  Neurological:   Patient denies headaches and dizziness.  Psychologic:   Patient denies depression and anxiety.   VITAL SIGNS: None   MULTI-SYSTEM PHYSICAL EXAMINATION:    Constitutional: Well-nourished. No physical deformities. Normally developed. Good grooming.  Respiratory: Normal breath sounds. No labored breathing, no use of accessory muscles.   Cardiovascular: Regular rate and rhythm. No murmur, no gallop. Normal temperature, normal extremity pulses, no swelling, no varicosities.      Complexity of Data:  Source Of History:  Patient  Records Review:   Pathology Reports, Previous Doctor Records, Previous Patient Records, POC Tool  Urine Test Review:   Urinalysis   PROCEDURES:         Flexible Cystoscopy - 52000  Risks, benefits, and some of the potential complications of the procedure were discussed at length with the patient including infection, bleeding, voiding discomfort, urinary retention, fever, chills, sepsis, and others. All questions were answered. Informed consent was obtained. Antibiotic prophylaxis was given. Sterile technique and intraurethral analgesia were used.  Meatus:  Normal size. Normal location. Normal condition.  Urethra:  No hypermobility. No leakage.  Ureteral Orifices:  Normal location. Normal size. Normal shape. Effluxed clear urine.  Bladder:  1 tumor lateral to the right ureteral orifice, 3 mm 1 tumor at the 12:00 or 11:45 position of the bladder neck that measures 3 mm      The lower urinary tract was carefully examined. The procedure was well-tolerated and without complications. Antibiotic instructions were given. Instructions were given to call the office immediately for bloody urine, difficulty urinating, urinary retention, painful or frequent urination, fever, chills, nausea, vomiting or other illness. The patient stated that she understood these instructions and would comply with them.          Visit Complexity - G2211          Urinalysis w/Scope Dipstick Dipstick Cont'd Micro  Color: Yellow Bilirubin: Neg mg/dL WBC/hpf: NS (Not Seen)  Appearance: Clear Ketones: Neg mg/dL RBC/hpf: 0 - 2/hpf  Specific Gravity: 1.010 Blood: 1+ ery/uL Bacteria: NS (Not Seen)  pH: 5.5 Protein: Neg mg/dL Cystals: NS (Not Seen)  Glucose: Neg mg/dL Urobilinogen: 0.2 mg/dL Casts: NS (Not Seen)    Nitrites: Neg Trichomonas: Not Present    Leukocyte Esterase: Neg leu/uL Mucous: Not Present      Epithelial Cells: 0 - 5/hpf      Yeast: NS (Not Seen)      Sperm: Not Present    ASSESSMENT:      ICD-10 Details  1 GU:   Bladder Cancer overlapping sites - C67.8    PLAN:           Document Letter(s):  Created for Patient: Clinical Summary         Notes:   The patient has 2 small low-grade tumors that have recurred. These are very small and superficial. Our plan is to schedule her for removal of the small areas in the operating room in the next 4 months.

## 2024-07-30 NOTE — Transfer of Care (Signed)
 Immediate Anesthesia Transfer of Care Note  Patient: Angel Williamson  Procedure(s) Performed: CYSTOSCOPY, WITH BIOPSY (Bilateral: Bladder) INSTILLATION, BLADDER (Bilateral)  Patient Location: PACU  Anesthesia Type:General  Level of Consciousness: awake, alert , and patient cooperative  Airway & Oxygen Therapy: Patient Spontanous Breathing and Patient connected to face mask oxygen  Post-op Assessment: Report given to RN and Post -op Vital signs reviewed and stable  Post vital signs: Reviewed and stable  Last Vitals:  Vitals Value Taken Time  BP 134/79 07/30/24 09:58  Temp    Pulse 73 07/30/24 10:00  Resp 21 07/30/24 10:00  SpO2 100 % 07/30/24 10:00  Vitals shown include unfiled device data.  Last Pain:  Vitals:   07/30/24 0708  TempSrc:   PainSc: 0-No pain      Patients Stated Pain Goal: 3 (07/30/24 0708)  Complications: No notable events documented.

## 2024-07-30 NOTE — Interval H&P Note (Signed)
 History and Physical Interval Note: NAD Vitals:   07/30/24 0657 07/30/24 0702  BP:  (!) 153/94  Pulse:  70  Resp:  16  Temp:  98.3 F (36.8 C)  TempSrc:  Oral  SpO2:  98%  Weight: 88 kg   Height: 5' 2 (1.575 m)    RRR CTA Abdomen soft  Ok to proceed.  07/30/2024 8:44 AM  Angel Williamson  has presented today for surgery, with the diagnosis of RECURRENT BLADDER CANCER.  The various methods of treatment have been discussed with the patient and family. After consideration of risks, benefits and other options for treatment, the patient has consented to  Procedure(s) with comments: CYSTOSCOPY, WITH BIOPSY (Bilateral) - CYSTOSCOPY WITH BILATERAL RETROGRADE PYELOGRAM, BLADDER BIOPSY, POSTOPERATIVE GEMCITABINE  INSTILLATION, BLADDER (Bilateral) as a surgical intervention.  The patient's history has been reviewed, patient examined, no change in status, stable for surgery.  I have reviewed the patient's chart and labs.  Questions were answered to the patient's satisfaction.     Angel Williamson

## 2024-07-30 NOTE — Discharge Instructions (Signed)

## 2024-07-30 NOTE — Anesthesia Preprocedure Evaluation (Signed)
 Anesthesia Evaluation  Patient identified by MRN, date of birth, ID band Patient awake    Reviewed: Allergy & Precautions, NPO status , Patient's Chart, lab work & pertinent test results, reviewed documented beta blocker date and time   Airway Mallampati: II  TM Distance: >3 FB Neck ROM: Full    Dental  (+) Teeth Intact, Dental Advisory Given   Pulmonary sleep apnea and Continuous Positive Airway Pressure Ventilation , former smoker   Pulmonary exam normal breath sounds clear to auscultation       Cardiovascular hypertension, Pt. on home beta blockers and Pt. on medications Normal cardiovascular exam Rhythm:Regular Rate:Normal     Neuro/Psych  PSYCHIATRIC DISORDERS Anxiety     negative neurological ROS     GI/Hepatic negative GI ROS, Neg liver ROS,,,  Endo/Other  negative endocrine ROS    Renal/GU negative Renal ROS  negative genitourinary   Musculoskeletal negative musculoskeletal ROS (+)    Abdominal   Peds  Hematology negative hematology ROS (+)   Anesthesia Other Findings   Reproductive/Obstetrics                              Anesthesia Physical Anesthesia Plan  ASA: 3  Anesthesia Plan: General   Post-op Pain Management: Tylenol  PO (pre-op)*   Induction: Intravenous  PONV Risk Score and Plan: 3 and Ondansetron , Dexamethasone  and Midazolam   Airway Management Planned: Oral ETT  Additional Equipment:   Intra-op Plan:   Post-operative Plan: Extubation in OR  Informed Consent: I have reviewed the patients History and Physical, chart, labs and discussed the procedure including the risks, benefits and alternatives for the proposed anesthesia with the patient or authorized representative who has indicated his/her understanding and acceptance.     Dental advisory given  Plan Discussed with: CRNA  Anesthesia Plan Comments:          Anesthesia Quick Evaluation

## 2024-07-30 NOTE — Op Note (Signed)
 Preoperative diagnosis:  Recurrent bladder cancer  Postoperative diagnosis:  Same  Procedure: Cystoscopy, bilateral retrograde pyelogram with interpretation TURBT, 1 cm in aggregate Instillation of postoperative Mitomycin -C  Surgeon: Morene MICAEL Salines, MD  Anesthesia: General  Complications: None  Intraoperative findings:  #1: The patient's cystoscopy demonstrated a tumor at the bladder neck on the anterior surface, a tumor on the right lateral wall just adjacent to the bladder neck, and 3 lesions on the dome of the bladder neck with some satellite abnormal appearing mucosal areas. #2: The patient's right retrograde pyelogram was performed with 10 cc of Omnipaque  contrast and demonstrated normal caliber ureter with no filling defects or abnormalities.  There was no hydronephrosis. #3: The patient's left retrograde pyelogram was performed with 10 cc of Omnipaque  contrast and demonstrated normal caliber ureter with no filling defects or abnormalities.  There is no hydronephrosis. #4: Postoperatively gemcitabine  was instilled in the PACU.  EBL: Minimal  Specimens: #1: Anterior bladder neck bladder biopsy #2: Right lateral wall adjacent to bladder neck bladder biopsy #3: Anterior bladder wall/bladder dome bladder biopsy x 3  Indication: Angel Williamson is a 68 y.o. patient with recurrent bladder cancer, always to a low-grade cicatricial disease.  It is noted on her most recent surveillance with cystoscopy to have an area of recurrence..  After reviewing the management options for treatment, he elected to proceed with the above surgical procedure(s). We have discussed the potential benefits and risks of the procedure, side effects of the proposed treatment, the likelihood of the patient achieving the goals of the procedure, and any potential problems that might occur during the procedure or recuperation. Informed consent has been obtained.  Description of procedure:   Consent was  obtained the preoperative holding area.  She was then brought back to the operating room placed on the table in supine position.  General anesthesia was induced.  She was placed in dorsolithotomy position and then prepped and draped in the routine sterile fashion.  A time was subsequently performed.  21 French degree cystoscope was gently passed through the patient's urethra and into the bladder under visual guidance.  The above findings were then noted.  I performed bilateral retrograde pyelograms with a 5 French open-ended ureteral catheter with the above findings.  I then used the rigid biopsy forceps and removed the bladder tumors at the anterior bladder neck, right lateral wall adjacent to the bladder neck, and anterior bladder wall/dome as noted in the above findings.  I subsequently used a Bugbee and fulgurated all these areas.  I fulgurated the dome and all the satellite mucosal abnormalities in that area.  I then inserted an 65 French Foley catheter.  She was subsequently extubated and returned the PACU in stable condition.  In the PACU, ~60mL's with 2gm of Gemcitabine  was instilled into the patient's bladder through an 43 French Foley catheter.  This was allowed to dwell for 60-90 minutes prior to emptying the Foley catheter and removing it.

## 2024-07-30 NOTE — Anesthesia Procedure Notes (Signed)
 Procedure Name: Intubation Date/Time: 07/30/2024 8:59 AM  Performed by: Metta Andrea NOVAK, CRNAPre-anesthesia Checklist: Patient identified, Emergency Drugs available, Suction available, Patient being monitored and Timeout performed Patient Re-evaluated:Patient Re-evaluated prior to induction Oxygen Delivery Method: Circle system utilized Preoxygenation: Pre-oxygenation with 100% oxygen Induction Type: IV induction Ventilation: Mask ventilation without difficulty Laryngoscope Size: Mac and 4 Grade View: Grade I Tube type: Oral Tube size: 7.0 mm Number of attempts: 1 Airway Equipment and Method: Stylet Placement Confirmation: ETT inserted through vocal cords under direct vision, positive ETCO2 and breath sounds checked- equal and bilateral Secured at: 21 cm Tube secured with: Tape Dental Injury: Teeth and Oropharynx as per pre-operative assessment

## 2024-07-31 ENCOUNTER — Encounter (HOSPITAL_COMMUNITY): Payer: Self-pay | Admitting: Urology

## 2024-07-31 LAB — SURGICAL PATHOLOGY

## 2024-07-31 NOTE — Anesthesia Postprocedure Evaluation (Signed)
 Anesthesia Post Note  Patient: Angel Williamson  Procedure(s) Performed: CYSTOSCOPY, WITH BIOPSY (Bilateral: Bladder) INSTILLATION, BLADDER (Bilateral)     Patient location during evaluation: PACU Anesthesia Type: General Level of consciousness: awake and alert Pain management: pain level controlled Vital Signs Assessment: post-procedure vital signs reviewed and stable Respiratory status: spontaneous breathing, nonlabored ventilation, respiratory function stable and patient connected to nasal cannula oxygen Cardiovascular status: blood pressure returned to baseline and stable Postop Assessment: no apparent nausea or vomiting Anesthetic complications: no   No notable events documented.  Last Vitals:  Vitals:   07/30/24 1158 07/30/24 1200  BP: (!) 138/93 (!) 141/79  Pulse: (!) 57 (!) 56  Resp: 20 17  Temp: 36.7 C   SpO2: 93% 94%    Last Pain:  Vitals:   07/30/24 1200  TempSrc:   PainSc: 0-No pain                 Aydan Levitz L Ileana Chalupa

## 2024-09-28 ENCOUNTER — Encounter: Payer: Self-pay | Admitting: Family Medicine

## 2024-09-28 ENCOUNTER — Ambulatory Visit: Payer: Self-pay

## 2024-09-28 ENCOUNTER — Ambulatory Visit: Admitting: Family Medicine

## 2024-09-28 VITALS — BP 144/88 | HR 73 | Temp 98.4°F | Resp 18 | Ht 62.0 in | Wt 199.0 lb

## 2024-09-28 DIAGNOSIS — I10 Essential (primary) hypertension: Secondary | ICD-10-CM

## 2024-09-28 DIAGNOSIS — J0140 Acute pansinusitis, unspecified: Secondary | ICD-10-CM | POA: Diagnosis not present

## 2024-09-28 LAB — POC COVID19 BINAXNOW: SARS Coronavirus 2 Ag: NEGATIVE

## 2024-09-28 MED ORDER — AMOXICILLIN-POT CLAVULANATE 875-125 MG PO TABS: 1.0000 | ORAL_TABLET | Freq: Two times a day (BID) | ORAL | 0 refills | Status: AC

## 2024-09-28 MED ORDER — ALBUTEROL SULFATE HFA 108 (90 BASE) MCG/ACT IN AERS: 2.0000 | INHALATION_SPRAY | Freq: Four times a day (QID) | RESPIRATORY_TRACT | 0 refills | Status: AC | PRN

## 2024-09-28 NOTE — Progress Notes (Signed)
 Assessment & Plan Acute non-recurrent pansinusitis - Recommended Tylenol  Cold and Flu for symptom relief. - Patient educated on the proper technique of the Albuterol inhaler; patient verbalized understanding.  - Education provided on sinus infections. Results for orders placed or performed in visit on 09/28/24  POC COVID-19 BinaxNow  Result Value Ref Range   SARS Coronavirus 2 Ag Negative Negative   Orders:   POC COVID-19 BinaxNow   albuterol (VENTOLIN HFA) 108 (90 Base) MCG/ACT inhaler; Inhale 2 puffs into the lungs every 6 (six) hours as needed.   amoxicillin -clavulanate (AUGMENTIN ) 875-125 MG tablet; Take 1 tablet by mouth 2 (two) times daily for 7 days.  Essential hypertension Blood pressure recorded at 144/88 mmHg. Reports occasional home monitoring with generally better readings. No immediate concern. - Advised periodic home blood pressure monitoring.      Follow up plan: Return if symptoms worsen or fail to improve.  Niki Rung, MSN, APRN, FNP-C  Subjective:  HPI: Angel Williamson is a 68 y.o. female presenting on 09/28/2024 for Sinus Problem (Started on FRI: sinus pain and pressure, HA, cough- yellow, some nasal drainage, unknown fever. )  Discussed the use of AI scribe software for clinical note transcription with the patient, who gave verbal consent to proceed.  Her symptoms began three days ago, including sinus pain, pressure, headache, cough, and drainage. She also experiences wheezing when breathing, although she has no history of asthma or COPD.  She has been trying to drink plenty of fluids and has taken aspirin and used nasal spray to alleviate her symptoms. However, she has not taken any over-the-counter medications specifically for sinus issues as she was unsure of what to take.  She feels that her symptoms have remained the same over the past few days and wants the congestion to 'start breaking up' to relieve the pressure in her head.  She occasionally  checks her blood pressure at home and reports that it is usually better than the reading taken during the visit. She has not checked it recently but plans to monitor it periodically.     ROS: Negative unless specifically indicated above in HPI.   Relevant past medical history reviewed and updated as indicated.   Allergies and medications reviewed and updated.   Current Outpatient Medications:    albuterol (VENTOLIN HFA) 108 (90 Base) MCG/ACT inhaler, Inhale 2 puffs into the lungs every 6 (six) hours as needed., Disp: 18 g, Rfl: 0   amoxicillin -clavulanate (AUGMENTIN ) 875-125 MG tablet, Take 1 tablet by mouth 2 (two) times daily for 7 days., Disp: 14 tablet, Rfl: 0   Cholecalciferol (D3 ADULT PO), Take 2,000 Units by mouth daily., Disp: , Rfl:    estradiol (ESTRACE) 0.1 MG/GM vaginal cream, Insert one gram in vagina daily for 14 days and then 2x weekly, Disp: , Rfl:    LORazepam  (ATIVAN ) 0.5 MG tablet, TAKE 1 TABLET BY MOUTH EVERY 8 HOURS AS NEEDED FOR ANXIETY., Disp: 30 tablet, Rfl: 0   losartan  (COZAAR ) 50 MG tablet, TAKE 1 TABLET BY MOUTH EVERY DAY, Disp: 90 tablet, Rfl: 1   metoprolol  succinate (TOPROL -XL) 25 MG 24 hr tablet, Take 0.5 tablets (12.5 mg total) by mouth daily., Disp: 45 tablet, Rfl: 2   triamcinolone (NASACORT ALLERGY 24HR) 55 MCG/ACT AERO nasal inhaler, Place 2 sprays into the nose daily., Disp: , Rfl:    traMADol  (ULTRAM ) 50 MG tablet, Take 1-2 tablets (50-100 mg total) by mouth every 6 (six) hours as needed for moderate pain (pain score 4-6). (  Patient not taking: Reported on 09/28/2024), Disp: 10 tablet, Rfl: 0  Allergies  Allergen Reactions   Hydrochlorothiazide     REACTION: low potassium   Amlodipine      Face flushing, body tingling   Losartan  Potassium     Muscle Weakness   Phenazopyridine  Swelling    Facial swelling   Septra  [Sulfamethoxazole -Trimethoprim ] Swelling    Facial flushing/swelling   Shellfish Allergy Hives    All shellfish   Spironolactone   Swelling    Muscle Weakness, Breast Swelling   Benazepril Hcl Cough    Objective:   BP (!) 144/88   Pulse 73   Temp 98.4 F (36.9 C)   Resp 18   Ht 5' 2 (1.575 m)   Wt 199 lb (90.3 kg)   SpO2 97%   BMI 36.40 kg/m    Physical Exam Vitals reviewed.  Constitutional:      General: She is not in acute distress.    Appearance: Normal appearance. She is not ill-appearing, toxic-appearing or diaphoretic.  HENT:     Head: Normocephalic and atraumatic.     Right Ear: Tympanic membrane, ear canal and external ear normal. There is no impacted cerumen.     Left Ear: Tympanic membrane, ear canal and external ear normal. There is no impacted cerumen.     Nose: Congestion present. No rhinorrhea.     Right Sinus: Maxillary sinus tenderness and frontal sinus tenderness present.     Left Sinus: Maxillary sinus tenderness and frontal sinus tenderness present.     Mouth/Throat:     Mouth: Mucous membranes are moist.     Pharynx: Oropharynx is clear. No oropharyngeal exudate or posterior oropharyngeal erythema.  Eyes:     General: No scleral icterus.       Right eye: No discharge.        Left eye: No discharge.     Conjunctiva/sclera: Conjunctivae normal.  Cardiovascular:     Rate and Rhythm: Normal rate and regular rhythm.     Heart sounds: Normal heart sounds. No murmur heard.    No friction rub. No gallop.  Pulmonary:     Effort: Pulmonary effort is normal. No respiratory distress.     Breath sounds: Normal breath sounds. No stridor. No wheezing, rhonchi or rales.  Musculoskeletal:        General: Normal range of motion.     Cervical back: Normal range of motion.  Lymphadenopathy:     Cervical: No cervical adenopathy.  Skin:    General: Skin is warm and dry.     Capillary Refill: Capillary refill takes less than 2 seconds.  Neurological:     General: No focal deficit present.     Mental Status: She is alert and oriented to person, place, and time. Mental status is at baseline.   Psychiatric:        Mood and Affect: Mood normal.        Behavior: Behavior normal.        Thought Content: Thought content normal.        Judgment: Judgment normal.

## 2024-09-28 NOTE — Telephone Encounter (Signed)
 FYI Only or Action Required?: FYI only for provider: appointment scheduled on 09/28/24.  Patient was last seen in primary care on 03/12/2024 by Geofm Glade PARAS, MD.  Called Nurse Triage reporting Sinusitis.  Symptoms began several days ago.  Interventions attempted: OTC medications: aspirin, Nasacort.  Symptoms are: productive cough with yellow mucous, post nasal drip, severe sinus pain/pressure gradually worsening.  Triage Disposition: See Today in Office (overriding Home Care)  Patient/caregiver understands and will follow disposition?: Yes           Copied from CRM #[id number]. Topic: Clinical - Red Word Triage >> Sep 28, 2024  8:07 AM Antwanette L wrote: Red Word that prompted transfer to Nurse Triage: Possible sinus infections. Pt is reporting severe headache, coughing up a yellowish mucus, sore throat, and facial pressure Reason for Disposition  [1] Sinus congestion as part of a cold AND [2] present < 10 days  Answer Assessment - Initial Assessment Questions 1. LOCATION: Where does it hurt?      Forehead and under eyes.  2. ONSET: When did the sinus pain start?  (e.g., hours, days)      Friday.  3. SEVERITY: How bad is the pain?   (Scale 0-10; or none, mild, moderate or severe)     Pressure; 7-8/10.  4. RECURRENT SYMPTOM: Have you ever had sinus problems before? If Yes, ask: When was the last time? and What happened that time?      Yes, unsure last time.  5. NASAL CONGESTION: Is the nose blocked? If Yes, ask: Can you open it or must you breathe through your mouth?     She states she can breathe through her nose but feels like it just goes down her throat, not coming up through nose.  6. NASAL DISCHARGE: Do you have discharge from your nose? If so ask, What color?     Coughing up yellow mucous.  7. FEVER: Do you have a fever? If Yes, ask: What is it, how was it measured, and when did it start?      No.  8. OTHER SYMPTOMS: Do you have any  other symptoms? (e.g., sore throat, cough, earache, difficulty breathing)     Cough, post nasal drip. Denies severe difficulty breathing, earaches or pressure, sore throat.  Treating at home with aspirin and Nasacort.  Protocols used: Sinus Pain or Congestion-A-AH

## 2024-09-28 NOTE — Assessment & Plan Note (Signed)
 Blood pressure recorded at 144/88 mmHg. Reports occasional home monitoring with generally better readings. No immediate concern. - Advised periodic home blood pressure monitoring.

## 2024-09-30 ENCOUNTER — Ambulatory Visit: Payer: Self-pay

## 2024-09-30 NOTE — Telephone Encounter (Signed)
 FYI Only or Action Required?: FYI only for provider: Home care advice given.  Patient was last seen in primary care on 09/28/2024 by Merlynn Niki FALCON, FNP.  Called Nurse Triage reporting Sore Throat.    Triage Disposition: Information or Advice Only Call, Home Care  Patient/caregiver understands and will follow disposition?: Yes        Copied from CRM [id number]. Topic: Clinical - Red Word Triage >> Sep 30, 2024 11:43 AM Shereese L wrote: [facility] that prompted transfer to Nurse Triage: possible strep throat, sore throat, facial pressure Reason for Disposition  [1] Sore throat is the only symptom AND [2] sore throat present < 48 hours  Health information question, no triage required and triager able to answer question  Answer Assessment - Initial Assessment Questions 1. REASON FOR CALL: What is the main reason for your call? or How can I best help you?  Patient calling in today to report a sore throat, she suspects strep throat; however she is having cough and other symptoms besides sore throat. Patient was seen in office on 12/1 for pansinusitis. She is currently taking amoxicillin , and wanted to know if the medication is used to treat strep.  Patient was advised, yes, providers do prescribe amoxicillin  for strep throat at times. Home care was also given. Patient agrees with plan of care, and care advice, and will call back if symptoms worsen or persist.  Protocols used: Sore Throat-A-AH, Information Only Call - No Triage-A-AH

## 2024-10-01 ENCOUNTER — Other Ambulatory Visit: Payer: Self-pay | Admitting: Internal Medicine

## 2024-11-04 ENCOUNTER — Other Ambulatory Visit: Payer: Self-pay | Admitting: Internal Medicine

## 2024-11-18 ENCOUNTER — Telehealth: Payer: Self-pay

## 2024-11-18 DIAGNOSIS — F419 Anxiety disorder, unspecified: Secondary | ICD-10-CM

## 2024-11-18 MED ORDER — LORAZEPAM 0.5 MG PO TABS: 0.5000 mg | ORAL_TABLET | Freq: Three times a day (TID) | ORAL | 0 refills | Status: AC | PRN

## 2024-11-18 NOTE — Addendum Note (Signed)
 Addended by: GEOFM GLADE PARAS on: 11/18/2024 04:15 PM   Modules accepted: Orders

## 2024-11-18 NOTE — Telephone Encounter (Signed)
 Copied from CRM #[id number]. Topic: Clinical - Prescription Issue >> Nov 18, 2024  9:46 AM Montie POUR wrote: Reason for CRM:  Ms. Bettinger is calling about her LORazepam  (ATIVAN ) 0.5 MG tablet. [facility] is stating it was coded incorrect. Please call pharmacy to see what is wrong with order at [phone]. She has 2 tablets left. Please call Ms. Fleer with questions at [phone]. Thanks

## 2024-11-18 NOTE — Telephone Encounter (Signed)
 Spoke with patient today.

## 2025-03-15 ENCOUNTER — Encounter: Admitting: Internal Medicine

## 2025-03-18 ENCOUNTER — Ambulatory Visit
